# Patient Record
Sex: Male | Born: 1955 | Race: White | Hispanic: No | Marital: Married | Smoking: Former smoker
Health system: Southern US, Community
[De-identification: ages and names within clinical notes are randomized; demographics above are authoritative.]

## PROBLEM LIST (undated history)

## (undated) DIAGNOSIS — N400 Enlarged prostate without lower urinary tract symptoms: Secondary | ICD-10-CM

## (undated) DIAGNOSIS — E785 Hyperlipidemia, unspecified: Secondary | ICD-10-CM

## (undated) DIAGNOSIS — R7302 Impaired glucose tolerance (oral): Secondary | ICD-10-CM

## (undated) DIAGNOSIS — I10 Essential (primary) hypertension: Secondary | ICD-10-CM

## (undated) HISTORY — PX: TOTAL KNEE ARTHROPLASTY: SHX125

---

## 2020-02-13 DIAGNOSIS — G459 Transient cerebral ischemic attack, unspecified: Secondary | ICD-10-CM

## 2020-02-13 HISTORY — DX: Transient cerebral ischemic attack, unspecified: G45.9

## 2020-10-17 ENCOUNTER — Other Ambulatory Visit (HOSPITAL_COMMUNITY): Payer: Medicare Other

## 2020-10-17 ENCOUNTER — Inpatient Hospital Stay (HOSPITAL_COMMUNITY): Payer: Medicare Other

## 2020-10-17 ENCOUNTER — Emergency Department (HOSPITAL_COMMUNITY): Payer: Medicare Other

## 2020-10-17 ENCOUNTER — Encounter (HOSPITAL_COMMUNITY): Payer: Self-pay | Admitting: Internal Medicine

## 2020-10-17 ENCOUNTER — Inpatient Hospital Stay (HOSPITAL_COMMUNITY)
Admission: EM | Admit: 2020-10-17 | Discharge: 2020-10-21 | DRG: 066 | Disposition: A | Discharge status: Rehabilitation Facility | Payer: Medicare Other | Attending: Internal Medicine | Admitting: Internal Medicine

## 2020-10-17 DIAGNOSIS — I63212 Cerebral infarction due to unspecified occlusion or stenosis of left vertebral arteries: Secondary | ICD-10-CM | POA: Diagnosis present

## 2020-10-17 DIAGNOSIS — H5500 Unspecified nystagmus: Secondary | ICD-10-CM | POA: Diagnosis present

## 2020-10-17 DIAGNOSIS — I10 Essential (primary) hypertension: Secondary | ICD-10-CM | POA: Diagnosis present

## 2020-10-17 DIAGNOSIS — R490 Dysphonia: Secondary | ICD-10-CM | POA: Diagnosis present

## 2020-10-17 DIAGNOSIS — I6521 Occlusion and stenosis of right carotid artery: Secondary | ICD-10-CM | POA: Diagnosis present

## 2020-10-17 DIAGNOSIS — Z823 Family history of stroke: Secondary | ICD-10-CM

## 2020-10-17 DIAGNOSIS — R1310 Dysphagia, unspecified: Secondary | ICD-10-CM | POA: Diagnosis present

## 2020-10-17 DIAGNOSIS — I672 Cerebral atherosclerosis: Secondary | ICD-10-CM | POA: Diagnosis present

## 2020-10-17 DIAGNOSIS — Z7984 Long term (current) use of oral hypoglycemic drugs: Secondary | ICD-10-CM

## 2020-10-17 DIAGNOSIS — G464 Cerebellar stroke syndrome: Secondary | ICD-10-CM | POA: Diagnosis not present

## 2020-10-17 DIAGNOSIS — Z8249 Family history of ischemic heart disease and other diseases of the circulatory system: Secondary | ICD-10-CM

## 2020-10-17 DIAGNOSIS — E785 Hyperlipidemia, unspecified: Secondary | ICD-10-CM | POA: Diagnosis present

## 2020-10-17 DIAGNOSIS — R7302 Impaired glucose tolerance (oral): Secondary | ICD-10-CM | POA: Diagnosis present

## 2020-10-17 DIAGNOSIS — R42 Dizziness and giddiness: Secondary | ICD-10-CM

## 2020-10-17 DIAGNOSIS — E669 Obesity, unspecified: Secondary | ICD-10-CM | POA: Diagnosis present

## 2020-10-17 DIAGNOSIS — Z6834 Body mass index (BMI) 34.0-34.9, adult: Secondary | ICD-10-CM

## 2020-10-17 DIAGNOSIS — R200 Anesthesia of skin: Secondary | ICD-10-CM | POA: Diagnosis present

## 2020-10-17 DIAGNOSIS — R29700 NIHSS score 0: Secondary | ICD-10-CM | POA: Diagnosis present

## 2020-10-17 DIAGNOSIS — I6502 Occlusion and stenosis of left vertebral artery: Secondary | ICD-10-CM

## 2020-10-17 DIAGNOSIS — R270 Ataxia, unspecified: Secondary | ICD-10-CM | POA: Diagnosis present

## 2020-10-17 DIAGNOSIS — G459 Transient cerebral ischemic attack, unspecified: Secondary | ICD-10-CM

## 2020-10-17 DIAGNOSIS — Z7982 Long term (current) use of aspirin: Secondary | ICD-10-CM

## 2020-10-17 DIAGNOSIS — R29818 Other symptoms and signs involving the nervous system: Secondary | ICD-10-CM

## 2020-10-17 DIAGNOSIS — N400 Enlarged prostate without lower urinary tract symptoms: Secondary | ICD-10-CM | POA: Diagnosis present

## 2020-10-17 DIAGNOSIS — I69398 Other sequelae of cerebral infarction: Secondary | ICD-10-CM | POA: Diagnosis not present

## 2020-10-17 DIAGNOSIS — I639 Cerebral infarction, unspecified: Secondary | ICD-10-CM | POA: Diagnosis present

## 2020-10-17 DIAGNOSIS — Z8673 Personal history of transient ischemic attack (TIA), and cerebral infarction without residual deficits: Secondary | ICD-10-CM

## 2020-10-17 DIAGNOSIS — R4789 Other speech disturbances: Secondary | ICD-10-CM | POA: Diagnosis present

## 2020-10-17 DIAGNOSIS — I679 Cerebrovascular disease, unspecified: Secondary | ICD-10-CM

## 2020-10-17 DIAGNOSIS — I708 Atherosclerosis of other arteries: Secondary | ICD-10-CM | POA: Diagnosis present

## 2020-10-17 DIAGNOSIS — Z79899 Other long term (current) drug therapy: Secondary | ICD-10-CM

## 2020-10-17 DIAGNOSIS — H532 Diplopia: Secondary | ICD-10-CM | POA: Diagnosis present

## 2020-10-17 DIAGNOSIS — G463 Brain stem stroke syndrome: Secondary | ICD-10-CM | POA: Diagnosis not present

## 2020-10-17 DIAGNOSIS — I6389 Other cerebral infarction: Secondary | ICD-10-CM | POA: Diagnosis not present

## 2020-10-17 DIAGNOSIS — Z20822 Contact with and (suspected) exposure to covid-19: Secondary | ICD-10-CM | POA: Diagnosis present

## 2020-10-17 HISTORY — DX: Benign prostatic hyperplasia without lower urinary tract symptoms: N40.0

## 2020-10-17 HISTORY — DX: Essential (primary) hypertension: I10

## 2020-10-17 HISTORY — DX: Hyperlipidemia, unspecified: E78.5

## 2020-10-17 HISTORY — DX: Impaired glucose tolerance (oral): R73.02

## 2020-10-17 LAB — DIFFERENTIAL
Abs Immature Granulocytes: 0.02 10*3/uL (ref 0.00–0.07)
Basophils Absolute: 0 10*3/uL (ref 0.0–0.1)
Basophils Relative: 1 %
Eosinophils Absolute: 0.1 10*3/uL (ref 0.0–0.5)
Eosinophils Relative: 1 %
Immature Granulocytes: 0 %
Lymphocytes Relative: 26 %
Lymphs Abs: 1.8 10*3/uL (ref 0.7–4.0)
Monocytes Absolute: 0.6 10*3/uL (ref 0.1–1.0)
Monocytes Relative: 9 %
Neutro Abs: 4.4 10*3/uL (ref 1.7–7.7)
Neutrophils Relative %: 63 %

## 2020-10-17 LAB — I-STAT CHEM 8, ED
BUN: 24 mg/dL — ABNORMAL HIGH (ref 8–23)
Calcium, Ion: 1.08 mmol/L — ABNORMAL LOW (ref 1.15–1.40)
Chloride: 108 mmol/L (ref 98–111)
Creatinine, Ser: 1.1 mg/dL (ref 0.61–1.24)
Glucose, Bld: 107 mg/dL — ABNORMAL HIGH (ref 70–99)
HCT: 41 % (ref 39.0–52.0)
Hemoglobin: 13.9 g/dL (ref 13.0–17.0)
Potassium: 4 mmol/L (ref 3.5–5.1)
Sodium: 142 mmol/L (ref 135–145)
TCO2: 25 mmol/L (ref 22–32)

## 2020-10-17 LAB — LIPID PANEL
Cholesterol: 156 mg/dL (ref 0–200)
HDL: 38 mg/dL — ABNORMAL LOW (ref >40)
LDL Cholesterol: 80 mg/dL (ref 0–99)
Total CHOL/HDL Ratio: 4.1 RATIO
Triglycerides: 189 mg/dL — ABNORMAL HIGH (ref <150)
VLDL: 38 mg/dL (ref 0–40)

## 2020-10-17 LAB — CBC
HCT: 43.9 % (ref 39.0–52.0)
Hemoglobin: 13.9 g/dL (ref 13.0–17.0)
MCH: 28.1 pg (ref 26.0–34.0)
MCHC: 31.7 g/dL (ref 30.0–36.0)
MCV: 88.9 fL (ref 80.0–100.0)
Platelets: 151 10*3/uL (ref 150–400)
RBC: 4.94 MIL/uL (ref 4.22–5.81)
RDW: 14.1 % (ref 11.5–15.5)
WBC: 7 10*3/uL (ref 4.0–10.5)
nRBC: 0 % (ref 0.0–0.2)

## 2020-10-17 LAB — CBG MONITORING, ED: Glucose-Capillary: 112 mg/dL — ABNORMAL HIGH (ref 70–99)

## 2020-10-17 LAB — COMPREHENSIVE METABOLIC PANEL
ALT: 28 U/L (ref 0–44)
AST: 27 U/L (ref 15–41)
Albumin: 4.2 g/dL (ref 3.5–5.0)
Alkaline Phosphatase: 75 U/L (ref 38–126)
Anion gap: 9 (ref 5–15)
BUN: 24 mg/dL — ABNORMAL HIGH (ref 8–23)
CO2: 26 mmol/L (ref 22–32)
Calcium: 9.4 mg/dL (ref 8.9–10.3)
Chloride: 106 mmol/L (ref 98–111)
Creatinine, Ser: 1.17 mg/dL (ref 0.61–1.24)
GFR, Estimated: >60 mL/min (ref >60)
Glucose, Bld: 105 mg/dL — ABNORMAL HIGH (ref 70–99)
Potassium: 4.1 mmol/L (ref 3.5–5.1)
Sodium: 141 mmol/L (ref 135–145)
Total Bilirubin: 0.4 mg/dL (ref 0.3–1.2)
Total Protein: 7 g/dL (ref 6.5–8.1)

## 2020-10-17 LAB — HEPATITIS PANEL, ACUTE
HCV Ab: NONREACTIVE
Hep A IgM: NONREACTIVE
Hep B C IgM: NONREACTIVE
Hepatitis B Surface Ag: NONREACTIVE

## 2020-10-17 LAB — HEMOGLOBIN A1C
Hgb A1c MFr Bld: 5.8 % — ABNORMAL HIGH (ref 4.8–5.6)
Mean Plasma Glucose: 119.76 mg/dL

## 2020-10-17 LAB — GLUCOSE, CAPILLARY: Glucose-Capillary: 148 mg/dL — ABNORMAL HIGH (ref 70–99)

## 2020-10-17 LAB — PROTIME-INR
INR: 1 (ref 0.8–1.2)
Prothrombin Time: 13.5 seconds (ref 11.4–15.2)

## 2020-10-17 LAB — SEDIMENTATION RATE: Sed Rate: 5 mm/hr (ref 0–16)

## 2020-10-17 LAB — APTT: aPTT: 28 seconds (ref 24–36)

## 2020-10-17 LAB — HIV ANTIBODY (ROUTINE TESTING W REFLEX): HIV Screen 4th Generation wRfx: NONREACTIVE

## 2020-10-17 LAB — SARS CORONAVIRUS 2 (TAT 6-24 HRS): SARS Coronavirus 2: NEGATIVE

## 2020-10-17 LAB — C-REACTIVE PROTEIN: CRP: 0.6 mg/dL (ref <1.0)

## 2020-10-17 MED ORDER — HYDRALAZINE HCL 20 MG/ML IJ SOLN
5.0000 mg | INTRAMUSCULAR | Status: DC | PRN
  Administered 2020-10-17: 5 mg via INTRAVENOUS
  Filled 2020-10-17: qty 1

## 2020-10-17 MED ORDER — ROSUVASTATIN CALCIUM 20 MG PO TABS: 20.0000 mg | ORAL_TABLET | Freq: Every day | ORAL | Status: DC

## 2020-10-17 MED ORDER — LIDOCAINE HCL (CARDIAC) PF 100 MG/5ML IV SOSY
PREFILLED_SYRINGE | INTRAVENOUS | Status: AC
  Filled 2020-10-17: qty 5

## 2020-10-17 MED ORDER — ROSUVASTATIN CALCIUM 5 MG PO TABS: 10.0000 mg | ORAL_TABLET | Freq: Every day | ORAL | Status: DC

## 2020-10-17 MED ORDER — ACETAMINOPHEN 325 MG PO TABS: 650.0000 mg | ORAL_TABLET | ORAL | Status: DC | PRN

## 2020-10-17 MED ORDER — SENNOSIDES-DOCUSATE SODIUM 8.6-50 MG PO TABS: 1.0000 | ORAL_TABLET | Freq: Every evening | ORAL | Status: DC | PRN

## 2020-10-17 MED ORDER — ACETAMINOPHEN 160 MG/5ML PO SOLN: 650.0000 mg | ORAL | Status: DC | PRN

## 2020-10-17 MED ORDER — ASPIRIN 300 MG RE SUPP: 300.0000 mg | Freq: Every day | RECTAL | Status: DC

## 2020-10-17 MED ORDER — IOHEXOL 350 MG/ML SOLN
75.0000 mL | Freq: Once | INTRAVENOUS | Status: AC | PRN
  Administered 2020-10-17: 75 mL via INTRAVENOUS

## 2020-10-17 MED ORDER — SODIUM CHLORIDE 0.9% FLUSH: 3.0000 mL | Freq: Once | INTRAVENOUS | Status: DC

## 2020-10-17 MED ORDER — ASPIRIN 325 MG PO TABS
325.0000 mg | ORAL_TABLET | Freq: Every day | ORAL | Status: DC
  Administered 2020-10-17 – 2020-10-21 (×5): 325 mg via ORAL
  Filled 2020-10-17 (×4): qty 1

## 2020-10-17 MED ORDER — CLOPIDOGREL BISULFATE 75 MG PO TABS
75.0000 mg | ORAL_TABLET | Freq: Every day | ORAL | Status: DC
  Administered 2020-10-17 – 2020-10-21 (×5): 75 mg via ORAL
  Filled 2020-10-17 (×5): qty 1

## 2020-10-17 MED ORDER — ACETAMINOPHEN 650 MG RE SUPP: 650.0000 mg | RECTAL | Status: DC | PRN

## 2020-10-17 MED ORDER — HYDRALAZINE HCL 20 MG/ML IJ SOLN
5.0000 mg | Freq: Once | INTRAMUSCULAR | Status: AC
  Administered 2020-10-17: 5 mg via INTRAVENOUS
  Filled 2020-10-17: qty 1

## 2020-10-17 MED ORDER — GABAPENTIN 300 MG PO CAPS
300.0000 mg | ORAL_CAPSULE | Freq: Every day | ORAL | Status: DC
  Administered 2020-10-18 – 2020-10-21 (×4): 300 mg via ORAL
  Filled 2020-10-17 (×4): qty 1

## 2020-10-17 MED ORDER — ACETAMINOPHEN 500 MG PO TABS
500.0000 mg | ORAL_TABLET | Freq: Two times a day (BID) | ORAL | Status: DC
  Administered 2020-10-17 – 2020-10-21 (×8): 500 mg via ORAL
  Filled 2020-10-17 (×8): qty 1

## 2020-10-17 MED ORDER — ENOXAPARIN SODIUM 40 MG/0.4ML IJ SOSY
40.0000 mg | PREFILLED_SYRINGE | INTRAMUSCULAR | Status: DC
  Administered 2020-10-17 – 2020-10-20 (×3): 40 mg via SUBCUTANEOUS
  Filled 2020-10-17 (×3): qty 0.4

## 2020-10-17 MED ORDER — IOHEXOL 350 MG/ML SOLN: 75.0000 mL | Freq: Once | INTRAVENOUS | Status: DC | PRN

## 2020-10-17 MED ORDER — LIDOCAINE HCL (PF) 1 % IJ SOLN
INTRAMUSCULAR | Status: AC
  Filled 2020-10-17: qty 30

## 2020-10-17 MED ORDER — ONDANSETRON HCL 4 MG/2ML IJ SOLN
INTRAMUSCULAR | Status: AC
  Administered 2020-10-17: 4 mg
  Filled 2020-10-17: qty 2

## 2020-10-17 MED ORDER — STROKE: EARLY STAGES OF RECOVERY BOOK: Freq: Once | Status: DC

## 2020-10-17 MED ORDER — ROSUVASTATIN CALCIUM 20 MG PO TABS
40.0000 mg | ORAL_TABLET | Freq: Every day | ORAL | Status: DC
  Administered 2020-10-17 – 2020-10-20 (×4): 40 mg via ORAL
  Filled 2020-10-17 (×4): qty 2

## 2020-10-17 MED ORDER — INSULIN ASPART 100 UNIT/ML IJ SOLN
0.0000 [IU] | Freq: Three times a day (TID) | INTRAMUSCULAR | Status: DC
  Administered 2020-10-18 – 2020-10-21 (×2): 1 [IU] via SUBCUTANEOUS

## 2020-10-17 MED ORDER — SODIUM CHLORIDE 0.9 % IV SOLN: INTRAVENOUS | Status: DC

## 2020-10-17 MED ORDER — FINASTERIDE 5 MG PO TABS
5.0000 mg | ORAL_TABLET | Freq: Every day | ORAL | Status: DC
  Administered 2020-10-18 – 2020-10-21 (×4): 5 mg via ORAL
  Filled 2020-10-17 (×4): qty 1

## 2020-10-17 NOTE — Progress Notes (Signed)
Patient arrived to 5C17 via stretcher, wife at bedside. Safety precautions and orders reviewed with patient. TELE applied and confirmed. VS appears stabled. Pt denied any pain but still with dz with movement. No other distress noted. Will continue to monitor.

## 2020-10-17 NOTE — H&P (Signed)
History and Physical    [employer] Sr[employer] ZOX:[id number] DOB: 1956/04/09 DOA: 10/17/2020  PCP: Dennis Palma., MD - [facility] Consultants:  Thamas Jaegers - orthopedics Patient coming from:  Home - lives with wife, daughter, grandson; NOK: Wife, [phone]   Chief Complaint: Neurologic changes  HPI: Dennis Ambrosini Sr. is a 65 y.o. male with medical history significant of HTN; IGT; elevated PSA; TKR 4 months ago; and TIA (02/2020) presenting with neurologic changes.He notices L face and foot tingling and voice changes.   Over the last 1-2 weeks, he has had severe vertigo and was prescribed Meclizine.  Last night, he noticed severe headache that woke him from sleep in his left temple.  Symptoms were similar to prior TIA.  He had an attempt at LP in the ER and this was not successful.  The facial symptoms are always there but currently intensified.  All symptoms are on the left - leg/foot tingling, facial tingling, vertigo, L visual blurriness.  No dysarthria.  His phonal quality is different from usual.  He feels dry/parched right now.    ED Course:  Carryover, per Dr. Margo Aye:  Dennis Strickland is a 65 y.o. male with past medical history of prior TIA/CVA presents to the ED with new gait instability and HA. LSN at 11 PM. Patient awoke at 1AM with left sided HA and trouble walking. MRI brain negative for acute CVA. Seen by stroke team, Dr. Wilford Corner. Concern for vasculitis, has ongoing work up. LP attempted in the ED but unsuccessful. May need LP under fluoroscopy by IR.   Review of Systems: As per HPI; otherwise review of systems reviewed and negative.   Ambulatory Status:  Ambulates without assistance  COVID Vaccine Status:  None  Past Medical History:  Diagnosis Date  . BPH (benign prostatic hyperplasia)   . Dyslipidemia   . Hypertension   . Impaired glucose tolerance   . TIA (transient ischemic attack) 02/2020    Past Surgical History:  Procedure Laterality Date  . TOTAL KNEE  ARTHROPLASTY      Social History   Socioeconomic History  . Marital status: Married    Spouse name: Not on file  . Number of children: Not on file  . Years of education: Not on file  . Highest education level: Not on file  Occupational History  . Occupation: Curator  Tobacco Use  . Smoking status: Never Smoker  . Smokeless tobacco: Never Used  Substance and Sexual Activity  . Alcohol use: Never  . Drug use: Never  . Sexual activity: Not on file  Other Topics Concern  . Not on file  Social History Narrative  . Not on file   Social Determinants of Health   Financial Resource Strain: Not on file  Food Insecurity: Not on file  Transportation Needs: Not on file  Physical Activity: Not on file  Stress: Not on file  Social Connections: Not on file  Intimate Partner Violence: Not on file    No Known Allergies  Family History  Problem Relation Age of Onset  . Hypertension Mother   . CVA Sister   . Lupus Sister   . CVA Brother 49    Prior to Admission medications   Not on File    Physical Exam: Vitals:   10/17/20 1030 10/17/20 1145 10/17/20 1300 10/17/20 1620  BP: (!) 182/78 (!) 157/95 (!) 192/96 (!) 190/90  Pulse: (!) 56 62 78 79  Resp: 15  16 18   Temp:   97.9 F (  36.6 C) 97.8 F (36.6 C)  TempSrc:   Oral Oral  SpO2: 94% 94% 94% 95%     . General:  Appears calm and comfortable and is in NAD . Eyes:  PERRL, EOMI, normal lids, iris . ENT:  grossly normal hearing, lips & tongue, mildly dry mm . Neck:  no LAD, masses or thyromegaly; no carotid bruits . Cardiovascular:  RRR, no m/r/g. No LE edema.  Marland Kitchen. Respiratory:   CTA bilaterally with no wheezes/rales/rhonchi.  Normal respiratory effort. . Abdomen:  soft, NT, ND . Skin:  no rash or induration seen on limited exam . Musculoskeletal:  grossly normal tone BUE/BLE, good ROM, no bony abnormality . Psychiatric:  grossly normal mood and affect, speech fluent and appropriate with mild hypophonia,  AOx3 . Neurologic:  CN 2-12 grossly intact, moves all extremities in coordinated fashion, sensation intact    Radiological Exams on Admission: Independently reviewed - see discussion in A/P where applicable  MR BRAIN WO CONTRAST  Result Date: 10/17/2020 CLINICAL DATA:  Initial evaluation for acute stroke. EXAM: MRI HEAD WITHOUT CONTRAST TECHNIQUE: Multiplanar, multiecho pulse sequences of the brain and surrounding structures were obtained without intravenous contrast. COMPARISON:  Prior studies from earlier the same day as well as previous MRI from 03/05/2020. FINDINGS: Brain: Examination degraded by motion artifact. Generalized age-related cerebral atrophy. Patchy T2/FLAIR hyperintensity within the periventricular and deep white matter both cerebral hemispheres most consistent with chronic small vessel ischemic disease, moderate in nature. Superimposed remote lacunar infarct present at the right thalamus. No abnormal foci of restricted diffusion to suggest acute or subacute ischemia. Gray-white matter differentiation maintained. No encephalomalacia to suggest chronic cortical infarction. No evidence for acute or chronic intracranial hemorrhage. No mass lesion, midline shift or mass effect. No hydrocephalus or extra-axial fluid collection. Pituitary gland and suprasellar region within normal limits. Midline structures intact. Vascular: Absent flow void within the left V4 segment, consistent with previously identified occlusion. Major intracranial vascular flow voids are otherwise maintained. Skull and upper cervical spine: Craniocervical junction within normal limits. Upper cervical spine normal. Bone marrow signal intensity within normal limits. No focal marrow replacing lesion. No scalp soft tissue abnormality. Sinuses/Orbits: Globes orbital soft tissues demonstrate no acute finding. Mild scattered mucosal thickening noted within the ethmoidal air cells and maxillary sinuses. Paranasal sinuses are  otherwise clear. No mastoid effusion. Inner ear structures grossly normal. Other: None. IMPRESSION: 1. No acute intracranial abnormality. 2. 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 Overall, appearance is similar as compared to previous MRI from 03/05/2020. Electronically Signed   By: Rise MuBenjamin  McClintock M.D.   On: 10/17/2020 04:23   CT HEAD CODE STROKE WO CONTRAST  Result Date: 10/17/2020 CLINICAL DATA:  Code stroke. Initial evaluation for neuro deficit, stroke suspected. EXAM: CT HEAD WITHOUT CONTRAST TECHNIQUE: Contiguous axial images were obtained from the base of the skull through the vertex without intravenous contrast. COMPARISON:  None. FINDINGS: Brain: Age-related cerebral atrophy with moderate chronic microvascular ischemic disease. Small remote lacunar infarct at the right thalamus. No acute intracranial hemorrhage. No acute large vessel territory infarct. No mass lesion, midline shift or mass effect. No hydrocephalus or extra-axial fluid collection. Vascular: No hyperdense vessel. Skull: Scalp soft tissues within normal limits.  Calvarium intact. Sinuses/Orbits: Globes and orbital soft tissues demonstrate no acute finding. Paranasal sinuses are largely clear. No mastoid effusion. Other: None. ASPECTS [employer]([place] Stroke Program Early CT Score) - Ganglionic level infarction (caudate, lentiform  nuclei, internal capsule, insula, M1-M3 cortex): 7 - Supraganglionic infarction (M4-M6 cortex): 3 Total score (0-10 with 10 being normal): 10 IMPRESSION: 1. No acute intracranial infarct or other abnormality. 2. ASPECTS is 10. 3. Age-related cerebral atrophy with chronic small vessel ischemic disease. These results were communicated to Dr. Wilford Corner at 2:35 amon 5/6/2022by text page via the [facility] messaging system. Electronically Signed   By: Rise Mu M.D.   On: 10/17/2020 02:36   CT ANGIO HEAD NECK W WO  CM W PERF (CODE STROKE)  Result Date: 10/17/2020 CLINICAL DATA:  Follow-up examination for acute stroke. EXAM: CT ANGIOGRAPHY HEAD AND NECK CT PERFUSION BRAIN TECHNIQUE: Multidetector CT imaging of the head and neck was performed using the standard protocol during bolus administration of intravenous contrast. Multiplanar CT image reconstructions and MIPs were obtained to evaluate the vascular anatomy. Carotid stenosis measurements (when applicable) are obtained utilizing NASCET criteria, using the distal internal carotid diameter as the denominator. Multiphase CT imaging of the brain was performed following IV bolus contrast injection. Subsequent parametric perfusion maps were calculated using RAPID software. CONTRAST:  38mL OMNIPAQUE IOHEXOL 350 MG/ML SOLN COMPARISON:  Prior head CT from earlier the same day. FINDINGS: CTA NECK FINDINGS Aortic arch: Examination technically limited by motion and timing of the contrast bolus. Visualized aortic arch normal caliber with normal branch pattern. No stenosis about the origin of the great vessels. Right carotid system: Right CCA patent from its origin to the bifurcation. Mild mixed plaque about the right bifurcation without significant stenosis. Right ICA patent distally without stenosis, dissection or occlusion. Left carotid system: Left CCA patent from its origin to the bifurcation without stenosis. Mild mixed plaque about the left bifurcation without significant stenosis. Left ICA widely patent distally without stenosis, dissection or occlusion. Vertebral arteries: Both vertebral arteries arise from subclavian arteries. Right vertebral artery dominant. Origin and proximal left vertebral artery not well evaluated due to adjacent venous contamination. Visualized portions of the vertebral arteries patent within the neck without appreciable stenosis or other acute vascular abnormality. Skeleton: No acute osseous abnormality. No discrete or worrisome osseous lesions. Other  neck: No other visible acute soft tissue abnormality within the neck. No mass or adenopathy. Upper chest: Visualized upper chest demonstrates no acute finding. Review of the MIP images confirms the above findings CTA HEAD FINDINGS Anterior circulation: Focal severe stenosis involving the horizontal petrous right ICA (series 7, image 127). Petrous left ICA widely patent. Additional scattered plaque within the carotid siphons with no more than mild stenosis elsewhere. A1 segments patent bilaterally. Normal anterior communicating artery complex. Anterior cerebral arteries patent to their distal aspects without appreciable stenosis. No M1 stenosis or occlusion. Normal MCA bifurcations. Distal MCA branches well perfused. Posterior circulation: Dominant right V4 segment patent to the vertebrobasilar junction without stenosis. Right PICA patent. Left vertebral artery largely occludes just as it courses into the cranial vault. Left PICA is perfused at the skull base. Basilar patent to its distal aspect without stenosis. Superior cerebellar arteries patent bilaterally. Both PCAs primarily supplied via the basilar. Multifocal atheromatous irregularity seen throughout the PCAs bilaterally. Associated multifocal severe left P2 stenoses (series 10, image 21). PCAs otherwise perfused to their distal aspects without proximal stenosis. Venous sinuses: Grossly patent allowing for timing the contrast bolus. Anatomic variants: None significant. No visible aneurysm. Review of the MIP images confirms the above findings CT Brain Perfusion Findings: ASPECTS: 10. CBF (<30%) Volume: 21mL Perfusion (Tmax>6.0s) volume: 13mL Mismatch Volume: 31mL Infarction Location:Negative CT perfusion for acute ischemia.  Delayed perfusion seen within the left cerebellar hemisphere related to the occluded left vertebral artery. IMPRESSION: CTA HEAD AND NECK IMPRESSION: 1. Negative CTA for emergent large vessel occlusion. 2. Focal severe stenosis involving the  horizontal petrous right ICA. 3. Multifocal severe left P2 stenoses. 4. 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 Dominant right vertebral artery widely patent. 5. Mild atheromatous disease about the major arterial vasculature within the neck. No flow-limiting stenosis within the neck. CT PERFUSION IMPRESSION: 1. Negative CT perfusion for acute ischemia. 2. 7 cc perfusion deficit within the left cerebellar hemisphere, in keeping with the occluded left V4 segment. Electronically Signed   By: Rise Mu M.D.   On: 10/17/2020 03:57    EKG: Independently reviewed.  NSR with rate 58; LVH with no evidence of acute ischemia   Labs on Admission: I have personally reviewed the available labs and imaging studies at the time of the admission.  Pertinent labs:   Glucose 105 BUN 24/Creatinine 1.17/GFR >60 Normal CBC INR 1.0   Assessment/Plan Principal Problem:   Acute CVA (cerebrovascular accident) (HCC) Active Problems:   Hypertension   Dyslipidemia   BPH (benign prostatic hyperplasia)   Impaired glucose tolerance   CVA -Patient presenting with hypophonia, severe vertigo, and L face and body tingling -Concerning for TIA/CVA -MRI was negative for infarction -However, CTA showed focal severe stenosis of the horizontal petrous R ICA as well as multifocal severe L P2 stenoses and V4 segment occlusion; there was also a 7cc perfusion deficit in the L cerebellar hemisphere -While vasculitis was considered (and so LP was attempted and ordered via IR), this was subsequently deemed less likely due to likely presence of acute CVA -He is thought to have a 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; MRI was likely too early to detect -Aspirin has been given to reduce stroke mortality and decrease morbidity -Will admit for further evaluation and monitoring -Telemetry monitoring -Repeat MRI tomorrow, likely plan for cerebral angiogram on  Monday -Echo -Risk stratification with FLP, A1c; will also check UDS -Neurology consult -PT/OT/ST/Nutrition Consults  HTN -Allow permissive HTN for now -Treat BP only if >180 per neurology, and then with goal of 15% reduction -Hold lisinopril and plan to restart in 48-72 hours   HLD -Check FLP - 156/38/80/189 -Continue Crestor but increase to 20 mg daily for goal LDL <70   IGT -A1c is 5.8, indicating good control -Hold home PO Metformin -Will order moderate-scale SSI -Continue Neurontin  BPH -Continue Proscar -Hold tamsulosin for now     Note: This patient has been tested and is negative for the novel coronavirus COVID-19. She has been fully vaccinated against COVID-19.    DVT prophylaxis:  Lovenox  Code Status: Full - confirmed with patient/family Family Communication: Wife present throughout evaluation Disposition Plan:  The patient is from: home  Anticipated d/c is to: home without [facility] services  Anticipated d/c date will depend on clinical response to treatment, likely early next week after cerebral angiogram  Patient is currently: acutely ill Consults called: Neurology; PT/OT/ST/Nutrition; TOC team Admission status: Admit - It is my clinical opinion that admission to INPATIENT is reasonable and necessary because of the expectation that this patient will require hospital care that crosses at least 2 midnights to treat this condition based on the medical complexity of the problems presented.  Given the aforementioned information, the predictability of an adverse outcome is felt to be significant.    Jonah Blue  MD [facility]   How to contact the [facility] Attending or Consulting provider 7A - 7P or covering provider during after hours 7P -7A, for this patient?  1. Check the care team in [facility] and look for a) attending/consulting [facility] provider listed and b) the [facility] team listed 2. Log into [email or web address] and use [place]'s universal password to access. If you do  not have the password, please contact the hospital operator. 3. Locate the [facility] provider you are looking for under [facility] and page to a number that you can be directly reached. 4. If you still have difficulty reaching the provider, please page the [facility] (Director on Call) for the Hospitalists listed on amion for assistance.   10/17/2020, 5:37 PM

## 2020-10-17 NOTE — ED Notes (Signed)
Patient vomited a  Large amt. States he was so dizzy after vomiting patient left much better , Dr. Ophelia Charter aware and orders received.

## 2020-10-17 NOTE — ED Notes (Signed)
Patient states he still feels numb on the left side of his face however able to feel the same , good strength to all ext. Alert oriented.

## 2020-10-17 NOTE — Code Documentation (Addendum)
Stroke Response Nurse Documentation Code Documentation  Dennis Guymon Sr. is a 65 y.o. male arriving to [facility] ED via [facility] on 5/6 with past medical hx of TIA, HTN,HLD. Code stroke was activated by EMS. Patient from home where he was LKW at 2300 and now complaining of Left sided numbness and altered gait. On aspirin 325 mg daily. Stroke team at the bedside on patient arrival. Labs drawn and patient cleared for CT by Dr. Jacqulyn Bath. Patient to CT with team. NIHSS 0 see documentation for details and code stroke times. Patient with no fixed deficits on exam. The following imaging was completed:  CTP/MRI. Patient is not a candidate for tPA due to improving symptoms. Bedside handoff with ED RN Thayer Ohm.    Rose Fillers  Rapid Response RN

## 2020-10-17 NOTE — Consult Note (Signed)
Neurology Consultation  Reason for Consult: Code stroke-left-sided numbness, incoordination, headache Referring Physician: Dr. Alona Bene  CC: Left-sided numbness, incoordination, headache-code stroke  History is obtained from: Patient, chart  HPI: Dennis Strickland is a 65 y.o. male past medical history of hypertension, hyperlipidemia, prediabetes, presumed TIA in September 2021-where the presentation was left-sided tingling numbness and left eye drooping and inability to walk, presenting today with potentially similar symptoms. Last known well at 11 PM when he went to bed and woke up at 2 AM with a severe headache, when he tried to walk, was difficult and he was discoordinated.  He felt that the left side of his face was numb.  To touch, he did not feel any difference in sensation but it was a subjective sensation of numbness.  He also felt that his voice sounded muffled. EMS was called and given sudden onset of focal neurological deficits, code stroke was called. His systolic blood pressures at home were in the 180s to 190s via EMS. He was evaluated in the emergency department as an acute code stroke. Reports no history of headaches or migraines.  He reports reasonable compliance to antihypertensive-very infrequently misses doses.  Care everywhere records with stroke work-up from September 2021 Unremarkable echocardiogram with no evidence of shunt on transthoracic echo with bubble. MRI negative for stroke CTA head and neck with no emergent LVO LDL 76- atorvastatin 80 Discharged home on aspirin and Plavix for 21 days followed by aspirin only Lisinopril increased to 20 mg twice daily No arrhythmias noted Most recent A1c-5.8-06/18/2020   LKW: 11 PM on 10/16/2020 tpa given?: no, too mild to treat versus stroke mimic  (hypertensive emergency versus complex migraine) Premorbid modified Rankin scale (mRS): 0  ROS: Full ROS was performed and is negative except as noted in the HPI.   No past  medical history on file.  Past medical history Hypertension-hyperlipidemia Prediabetes Presumed TIA in September 2021 with features of hypertensive emergency   No family history on file.  Social History:   has no history on file for tobacco use, alcohol use, and drug use.  Medications  Current Facility-Administered Medications:  .  sodium chloride flush (NS) 0.9 % injection 3 mL, 3 mL, Intravenous, Once, Long, Arlyss Repress, MD No current outpatient medications on file.   Exam: Current vital signs: BP (!) 181/97 (BP Location: Right Arm)   Pulse (!) 58   Resp 19   SpO2 98%  Vital signs in last 24 hours: Pulse Rate:  [58] 58 (05/06 0220) Resp:  [19] 19 (05/06 0220) BP: (181)/(97) 181/97 (05/06 0220) SpO2:  [98 %] 98 % (05/06 0220) GENERAL: Awake, alert in NAD HEENT: - Normocephalic and atraumatic, dry mm, no LN++, no Thyromegally LUNGS - Clear to auscultation bilaterally with no wheezes CV - S1S2 RRR, no m/r/g, equal pulses bilaterally. ABDOMEN - Soft, nontender, nondistended with normoactive BS Ext: warm, well perfused, intact peripheral pulses, no edema  NEURO:  Mental Status: AA&Ox3  Language: speech is clear but mildly hypophonic.  Naming, repetition, fluency, and comprehension intact. Cranial Nerves: PERRL EOMI, visual fields full, no facial asymmetry, facial sensation intact to light touch but subjectively feels numb on the left side of the face, hearing intact, tongue/uvula/soft palate midline, normal sternocleidomastoid and trapezius muscle strength. No evidence of tongue atrophy or fibrillations Motor: 5/5 in all fours without drift Tone: is normal and bulk is normal Sensation- Intact to light touch bilaterally Coordination: FTN intact bilaterally, no ataxia in BLE. Gait- deferred  NIHSS-0  Labs I have reviewed labs in epic and the results pertinent to this consultation are:   CBC    Component Value Date/Time   WBC 7.0 10/17/2020 0218   RBC 4.94  10/17/2020 0218   HGB 13.9 10/17/2020 0227   HCT 41.0 10/17/2020 0227   PLT 151 10/17/2020 0218   MCV 88.9 10/17/2020 0218   MCH 28.1 10/17/2020 0218   MCHC 31.7 10/17/2020 0218   RDW 14.1 10/17/2020 0218   LYMPHSABS 1.8 10/17/2020 0218   MONOABS 0.6 10/17/2020 0218   EOSABS 0.1 10/17/2020 0218   BASOSABS 0.0 10/17/2020 0218    CMP     Component Value Date/Time   NA 142 10/17/2020 0227   K 4.0 10/17/2020 0227   CL 108 10/17/2020 0227   CO2 26 10/17/2020 0218   GLUCOSE 107 (H) 10/17/2020 0227   BUN 24 (H) 10/17/2020 0227   CREATININE 1.10 10/17/2020 0227   CALCIUM 9.4 10/17/2020 0218   PROT 7.0 10/17/2020 0218   ALBUMIN 4.2 10/17/2020 0218   AST 27 10/17/2020 0218   ALT 28 10/17/2020 0218   ALKPHOS 75 10/17/2020 0218   BILITOT 0.4 10/17/2020 0218   GFRNONAA >60 10/17/2020 0218    Imaging I have reviewed the images obtained:  CT-scan of the brain-negative for acute process.  Aspects 10 CT head and neck with no evidence of emergent large vessel occlusion.  Focal severe stenosis of the horizontal petrous right ICA, multifocal severe left P2 stenosis, occlusion of the left V4 just as it courses into the cranial vault with dominant right vertebral artery that is widely patent.  Mild atheromatous disease about the major arterial vasculature within the neck with no flow-limiting stenosis within the neck. CT perfusion study showed 7 cc of perfusion deficit within the left cerebellar hemisphere-questionably could be in keeping with the occluded left V4 segment.  Prior CT and CTA head and neck September 2020-reviewed in PACS from [facility] under Patient ID: [id number] [facility] CT perfusion at that time showed bitemporal decreased perfusion with left worse than right. The left PCA did not seem to have multifocal stenoses then but has now raising suspicion for some sort of vasculitic versus R CVS phenomenon  MRI examination of the brain-stat-no evidence of stroke  IMPRESSION: 1.  No acute intracranial abnormality. 2. 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 Overall, appearance is similar as compared to previous MRI from 03/05/2020.  Assessment: 65 year old with above past medical history including that of a TIA in September 2021 for which she presented with symptoms of headache, left-sided subjective numbness that resolved quickly but today presents with headache followed by left-sided subjective numbness and still no resolution of symptoms. But this time he was hypertensive to the 180s to 190s systolic. Hypertensive emergency is top of the differential. Reviewing his vessel imaging, and comparing his CTA September 2021 to today, there appears to be left PCA multifocal stenosis almost in a sausagelike pattern making one wonder if this is some sort of a vasculitic/RCVS (reversible cerebral vasoconstriction syndrome) kind of phenomenon.  Also CT perfusion imaging in 2021 and today showed different patterns of diminished perfusion without acute stroke on MRI Further work-up for vasculitis/reversible vasoconstriction would be helpful at this time.  Impression: Evaluate for hypertensive emergency Evaluate for CNS vasculitis/reversible cerebral vasoconstriction syndrome (RCVS) Evaluate for TIA secondary to above Headache- question related to CNS vasculitis  Recommendations: -Would recommend admission for observation -Blood pressure  goal- tricky given differentials that include hypertensive emergency versus TIA.  I would recommend systolic blood pressure goal less than 180 for now. -Continue aspirin -Continue statin -2D echo -A1c -Lipid panel -Frequent neurochecks -Telemetry -PT -OT -Speech therapy -May need consideration for diagnostic cerebral angiogram after all the work-up is completed-may be as an outpatient.  Vasculitic work-up to include:   Serum  Antinuclear antibodies (ANA)  Antibodies to the Ro/SSA, La/SSB, Sm, and RNP antigens  Antibodies to double-stranded DNA  Antiphospholipid antibodies (lupus anticoagulant [LA], IgG, and IgM anticardiolipin [aCL] antibodies; and IgG and IgM anti-beta2-glycoprotein [GP] I)  Antineutrophil cytoplasmic antibodies (ANCA)  Serum C3 and C4  Serum cryoglobulins  Serum and urine protein electrophoresis with immune electrophoresis  Hepatitis B and C viruses  HIV  CSF:  Lumbar puncture for CSF protein, glucose, cell count, IgG index and oligoclonal bands  Discussed with Dr. Jacqulyn Bath in the emergency room  Stroke team will follow.  -- Milon Dikes, MD Neurologist [facility] Pager: [phone]

## 2020-10-17 NOTE — ED Notes (Signed)
Admitting MD at bedside.

## 2020-10-17 NOTE — Progress Notes (Signed)
STROKE TEAM PROGRESS NOTE   SUBJECTIVE (INTERVAL HISTORY) His wife is at the bedside. Pt recounted HPI with me. In 02/2020 pt had 5 sneezes, after that he had vertigo, room spinning, by the time he arrived to [facility] ED, symptoms resolved. MRI was neg but his CTA head and neck showed left VA distal occlusion vs. High grade stenosis. This time, he again sneezed hard twice and he had severe pain at left temporal region, associated with vertigo, difficulty swallowing, hoarseness, difficulty walking. The HA lasted about 15-20 min resovled, but vertigo continued, motion makes it worse. In ER, he also stated intermittent hiccup. On exam, he had left UE ataxia and upper beating nystagmus on bilateral gaze, more prominent with left gaze. His CTA head and neck again showed left V4 occlusion and seems smaller V3 comparing with before. 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 Will need to repeat MRI brain and plan for cerebral angio on Monday. His ESR and CRP normal, low suspicious for vasculitis at this time, will cancel LP.    OBJECTIVE Temp:  [97.5 F (36.4 C)-98.6 F (37 C)] 98.6 F (37 C) (05/06 1955) Pulse Rate:  [49-95] 95 (05/06 1955) Cardiac Rhythm: Normal sinus rhythm (05/06 1900) Resp:  [7-19] 16 (05/06 1955) BP: (154-198)/(68-124) 167/93 (05/06 1955) SpO2:  [92 %-99 %] 97 % (05/06 1955) Weight:  [120.2 kg] 120.2 kg (05/06 1806)  Recent Labs  Lab 10/17/20 0218  GLUCAP 112*   Recent Labs  Lab 10/17/20 0218 10/17/20 0227  NA 141 142  K 4.1 4.0  CL 106 108  CO2 26  --   GLUCOSE 105* 107*  BUN 24* 24*  CREATININE 1.17 1.10  CALCIUM 9.4  --    Recent Labs  Lab 10/17/20 0218  AST 27  ALT 28  ALKPHOS 75  BILITOT 0.4  PROT 7.0  ALBUMIN 4.2   Recent Labs  Lab 10/17/20 0218 10/17/20 0227  WBC 7.0  --   NEUTROABS 4.4  --   HGB 13.9 13.9  HCT 43.9 41.0  MCV 88.9  --   PLT 151  --    No results for input(s): CKTOTAL, CKMB, CKMBINDEX, TROPONINI in the last 168  hours. Recent Labs    10/17/20 0218  LABPROT 13.5  INR 1.0   No results for input(s): COLORURINE, LABSPEC, PHURINE, GLUCOSEU, HGBUR, BILIRUBINUR, KETONESUR, PROTEINUR, UROBILINOGEN, NITRITE, LEUKOCYTESUR in the last 72 hours.  Invalid input(s): APPERANCEUR     Component Value Date/Time   CHOL 156 10/17/2020 1017   TRIG 189 (H) 10/17/2020 1017   HDL 38 (L) 10/17/2020 1017   CHOLHDL 4.1 10/17/2020 1017   VLDL 38 10/17/2020 1017   LDLCALC 80 10/17/2020 1017   Lab Results  Component Value Date   HGBA1C 5.8 (H) 10/17/2020   No results found for: LABOPIA, COCAINSCRNUR, LABBENZ, AMPHETMU, THCU, LABBARB  No results for input(s): ETH in the last 168 hours.  I have personally reviewed the radiological images below and agree with the radiology interpretations.  MR BRAIN WO CONTRAST  Result Date: 10/17/2020 CLINICAL DATA:  Initial evaluation for acute stroke. EXAM: MRI HEAD WITHOUT CONTRAST TECHNIQUE: Multiplanar, multiecho pulse sequences of the brain and surrounding structures were obtained without intravenous contrast. COMPARISON:  Prior studies from earlier the same day as well as previous MRI from 03/05/2020. FINDINGS: Brain: Examination degraded by motion artifact. Generalized age-related cerebral atrophy. Patchy T2/FLAIR hyperintensity within the periventricular and deep white matter both cerebral hemispheres most consistent with chronic small vessel ischemic  disease, moderate in nature. Superimposed remote lacunar infarct present at the right thalamus. No abnormal foci of restricted diffusion to suggest acute or subacute ischemia. Gray-white matter differentiation maintained. No encephalomalacia to suggest chronic cortical infarction. No evidence for acute or chronic intracranial hemorrhage. No mass lesion, midline shift or mass effect. No hydrocephalus or extra-axial fluid collection. Pituitary gland and suprasellar region within normal limits. Midline structures intact. Vascular: Absent  flow void within the left V4 segment, consistent with previously identified occlusion. Major intracranial vascular flow voids are otherwise maintained. Skull and upper cervical spine: Craniocervical junction within normal limits. Upper cervical spine normal. Bone marrow signal intensity within normal limits. No focal marrow replacing lesion. No scalp soft tissue abnormality. Sinuses/Orbits: Globes orbital soft tissues demonstrate no acute finding. Mild scattered mucosal thickening noted within the ethmoidal air cells and maxillary sinuses. Paranasal sinuses are otherwise clear. No mastoid effusion. Inner ear structures grossly normal. Other: None. IMPRESSION: 1. No acute intracranial abnormality. 2. 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 Overall, appearance is similar as compared to previous MRI from 03/05/2020. Electronically Signed   By: Jeannine Boga M.D.   On: 10/17/2020 04:23   CT HEAD CODE STROKE WO CONTRAST  Result Date: 10/17/2020 CLINICAL DATA:  Code stroke. Initial evaluation for neuro deficit, stroke suspected. EXAM: CT HEAD WITHOUT CONTRAST TECHNIQUE: Contiguous axial images were obtained from the base of the skull through the vertex without intravenous contrast. COMPARISON:  None. FINDINGS: Brain: Age-related cerebral atrophy with moderate chronic microvascular ischemic disease. Small remote lacunar infarct at the right thalamus. No acute intracranial hemorrhage. No acute large vessel territory infarct. No mass lesion, midline shift or mass effect. No hydrocephalus or extra-axial fluid collection. Vascular: No hyperdense vessel. Skull: Scalp soft tissues within normal limits.  Calvarium intact. Sinuses/Orbits: Globes and orbital soft tissues demonstrate no acute finding. Paranasal sinuses are largely clear. No mastoid effusion. Other: None. ASPECTS [facility] Stroke Program Early CT Score)  - Ganglionic level infarction (caudate, lentiform nuclei, internal capsule, insula, M1-M3 cortex): 7 - Supraganglionic infarction (M4-M6 cortex): 3 Total score (0-10 with 10 being normal): 10 IMPRESSION: 1. No acute intracranial infarct or other abnormality. 2. ASPECTS is 10. 3. Age-related cerebral atrophy with chronic small vessel ischemic disease. These results were communicated to Dr. Rory Percy at 2:35 amon 5/6/2022by text page via the [facility] messaging system. Electronically Signed   By: Jeannine Boga M.D.   On: 10/17/2020 02:36   CT ANGIO HEAD NECK W WO CM W PERF (CODE STROKE)  Result Date: 10/17/2020 CLINICAL DATA:  Follow-up examination for acute stroke. EXAM: CT ANGIOGRAPHY HEAD AND NECK CT PERFUSION BRAIN TECHNIQUE: Multidetector CT imaging of the head and neck was performed using the standard protocol during bolus administration of intravenous contrast. Multiplanar CT image reconstructions and MIPs were obtained to evaluate the vascular anatomy. Carotid stenosis measurements (when applicable) are obtained utilizing NASCET criteria, using the distal internal carotid diameter as the denominator. Multiphase CT imaging of the brain was performed following IV bolus contrast injection. Subsequent parametric perfusion maps were calculated using RAPID software. CONTRAST:  74m OMNIPAQUE IOHEXOL 350 MG/ML SOLN COMPARISON:  Prior head CT from earlier the same day. FINDINGS: CTA NECK FINDINGS Aortic arch: Examination technically limited by motion and timing of the contrast bolus. Visualized aortic arch normal caliber with normal branch pattern. No stenosis about the origin of the great vessels. Right carotid system: Right CCA patent from its origin to  the bifurcation. Mild mixed plaque about the right bifurcation without significant stenosis. Right ICA patent distally without stenosis, dissection or occlusion. Left carotid system: Left CCA patent from its origin to the bifurcation without stenosis. Mild mixed  plaque about the left bifurcation without significant stenosis. Left ICA widely patent distally without stenosis, dissection or occlusion. Vertebral arteries: Both vertebral arteries arise from subclavian arteries. Right vertebral artery dominant. Origin and proximal left vertebral artery not well evaluated due to adjacent venous contamination. Visualized portions of the vertebral arteries patent within the neck without appreciable stenosis or other acute vascular abnormality. Skeleton: No acute osseous abnormality. No discrete or worrisome osseous lesions. Other neck: No other visible acute soft tissue abnormality within the neck. No mass or adenopathy. Upper chest: Visualized upper chest demonstrates no acute finding. Review of the MIP images confirms the above findings CTA HEAD FINDINGS Anterior circulation: Focal severe stenosis involving the horizontal petrous right ICA (series 7, image 127). Petrous left ICA widely patent. Additional scattered plaque within the carotid siphons with no more than mild stenosis elsewhere. A1 segments patent bilaterally. Normal anterior communicating artery complex. Anterior cerebral arteries patent to their distal aspects without appreciable stenosis. No M1 stenosis or occlusion. Normal MCA bifurcations. Distal MCA branches well perfused. Posterior circulation: Dominant right V4 segment patent to the vertebrobasilar junction without stenosis. Right PICA patent. Left vertebral artery largely occludes just as it courses into the cranial vault. Left PICA is perfused at the skull base. Basilar patent to its distal aspect without stenosis. Superior cerebellar arteries patent bilaterally. Both PCAs primarily supplied via the basilar. Multifocal atheromatous irregularity seen throughout the PCAs bilaterally. Associated multifocal severe left P2 stenoses (series 10, image 21). PCAs otherwise perfused to their distal aspects without proximal stenosis. Venous sinuses: Grossly patent  allowing for timing the contrast bolus. Anatomic variants: None significant. No visible aneurysm. Review of the MIP images confirms the above findings CT Brain Perfusion Findings: ASPECTS: 10. CBF (<30%) Volume: 55m Perfusion (Tmax>6.0s) volume: 718mMismatch Volume: 48m82mnfarction Location:Negative CT perfusion for acute ischemia. Delayed perfusion seen within the left cerebellar hemisphere related to the occluded left vertebral artery. IMPRESSION: CTA HEAD AND NECK IMPRESSION: 1. Negative CTA for emergent large vessel occlusion. 2. 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 3. Multifocal severe left P2 stenoses. 4. 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 Dominant right vertebral artery widely patent. 5. Mild atheromatous disease about the major arterial vasculature within the neck. No flow-limiting stenosis within the neck. CT PERFUSION IMPRESSION: 1. Negative CT perfusion for acute ischemia. 2. 7 cc perfusion deficit within the left cerebellar hemisphere, in keeping with the occluded left V4 segment. Electronically Signed   By: BenJeannine BogaD.   On: 10/17/2020 03:57    PHYSICAL EXAM  Temp:  [97.5 F (36.4 C)-98.6 F (37 C)] 98.6 F (37 C) (05/06 1955) Pulse Rate:  [49-95] 95 (05/06 1955) Resp:  [7-19] 16 (05/06 1955) BP: (154-198)/(68-124) 167/93 (05/06 1955) SpO2:  [92 %-99 %] 97 % (05/06 1955) Weight:  [120.2 kg] 120.2 kg (05/06 1806)  General - Well nourished, well developed, in acute distress due to vertigo.  Ophthalmologic - fundi not visualized due to noncooperation.  Cardiovascular - Regular rhythm and rate.  Mental Status -  Level of arousal and orientation to time, place, and person were intact. Language including expression, naming, repetition, comprehension was assessed and found intact. Fund of Knowledge was assessed and was intact.  Cranial Nerves II - XII - II - Visual field intact OU. III, IV, VI -  Extraocular movements intact. V - Facial light touch sensation intact bilaterally, but feeling left facial tingling. VII - Facial movement intact bilaterally. VIII - Hearing intact bilaterally. Upbeat nystagmus on bilateral horizontal gaze, more prominent on the left gaze X - Palate elevates symmetrically. Pt does have hoarseness. And complaining of swallow difficulty and intermittent hiccup XI - Chin turning & shoulder shrug intact bilaterally. XII - Tongue protrusion intact.  Motor Strength - The patient's strength was normal in all extremities and pronator drift was absent.  Bulk was normal and fasciculations were absent.   Motor Tone - Muscle tone was assessed at the neck and appendages and was normal.  Reflexes - The patient's reflexes were symmetrical in all extremities and he had no pathological reflexes.  Sensory - Light touch, temperature/pinprick were assessed and were symmetrical.    Coordination - The patient had normal movements in the right hand and leg with no ataxia or dysmetria. However, left FTN ataxia and HTS mild dysmetria Tremor was absent.  Gait and Station - deferred.   ASSESSMENT/PLAN Mr. Dennis Jasinski Sr. is a 65 y.o. male with history of hypertension, hyperlipidemia, TIA 02/2020 admitted for transient left temporal headache, vertigo, swallowing difficulty, hoarseness, left-sided numbness and discoordination after 2 bad sneezes. No tPA given due to mild symptoms.    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  Focal severe stenosis right petrous ICA, tandem stenosis severe left P2  MR repeat pending  Plan for cerebral angiogram next Monday  2D Echo  pending  LDL 80  HgbA1c 5.8  ESR 5 and CRP 0.6  Hypercoagulable and autoimmune labs pending  Lovenox for VTE prophylaxis  aspirin 81 mg daily prior to admission, now on aspirin 325 mg daily and clopidogrel 75 mg daily.  Continue DAPT for 3 months and then Plavix alone given intracranial stenosis/occlusion.  Patient counseled to be compliant with his antithrombotic medications  Ongoing aggressive stroke risk factor management  Therapy recommendations: Pending  Disposition: Pending  History of TIA  02/2020 admitted to [facility] for similar episode with difficulty walking, left-sided numbness tingling, dizziness after 5 episode of sneeze.  CTA head and neck was reported normal however, on further review, patient ready had right petrous ICA severe stenosis, left V4 occlusion.  Left P2 stenosis present however not reported likely due to venous contamination.  MRI no acute infarct.  2D echo unremarkable.  Patient symptoms resolved in ED at the time.  Patient discharged with DAPT and Lipitor 80.  Now we think it back, concerning that episode likely due to left VA dissection / occlusion after sneeze  Hypertension . Stable on the high end . Permissive hypertension (OK if <220/120) for 24-48 hours post stroke and then gradually reach goal within 5-7 days.  Long term BP goal 130-150 given multifocal intracranial severe stenosis.  Hyperlipidemia  Home meds: Crestor 20  LDL 80, goal < 70  Now on Crestor 40  Continue statin at discharge  Other Stroke Risk Factors  Advanced age  Obesity, Body mass index is 34.96 kg/m.   Other [facility]  day # 0  I discussed with Dr. Norma Fredrickson and Dr. Malen Gauze. I spent  35 minutes in total face-to-face time with the patient, more than 50% of which was spent in counseling and coordination of care, reviewing test results, images and medication, and discussing the diagnosis, treatment plan and potential prognosis.  This patient's care requiresreview of multiple databases, neurological assessment, discussion with family, other specialists and medical decision making of high complexity. I had long discussion with patient and wife at bedside, updated pt current condition, treatment plan and potential prognosis, and answered all the questions.  They expressed understanding and appreciation.    Rosalin Hawking, MD PhD Stroke Neurology 10/17/2020 8:48 PM    To contact Stroke Continuity provider, please refer to [email or web address] After hours, contact General Neurology

## 2020-10-17 NOTE — ED Provider Notes (Signed)
Emergency Department Provider Note   I have reviewed the triage vital signs and the nursing notes.   HISTORY  Chief Complaint No chief complaint on file.   HPI Dennis BisJeffery Dalgleish Sr. is a 65 y.o. male with past medical history of prior TIA/CVA presents to the ED with new gait instability and HA. LSN at 11 PM. Patient awoke at 1AM with left sided HA and trouble walking. Notes he has baseline numbness to the left face and voice changes which are old. No CP, SOB, abdominal pain. EMS was called and activated a CODE STROKE. No intervention en route. Patient takes an ASA daily but not anticoagulated.    No past medical history on file.  Patient Active Problem List   Diagnosis Date Noted  . Vertigo 10/17/2020   Allergies Patient has no allergy information on record.  No family history on file.  Social History    Review of Systems  Constitutional: No fever/chills Eyes: No visual changes. ENT: No sore throat. Cardiovascular: Denies chest pain. Respiratory: Denies shortness of breath. Gastrointestinal: No abdominal pain.  No nausea, no vomiting.  No diarrhea.  No constipation. Genitourinary: Negative for dysuria. Musculoskeletal: Negative for back pain. Skin: Negative for rash. Neurological: Negative for focal weakness or numbness. Positive HA and gait instability.   10-point ROS otherwise negative.  ____________________________________________   PHYSICAL EXAM:  VITAL SIGNS: Vitals:   10/17/20 0600 10/17/20 0630  BP: (!) 167/68 (!) 154/91  Pulse: 62 (!) 58  Resp: 14 12  Temp:    SpO2: 98% 99%    Constitutional: Alert and oriented. Well appearing and in no acute distress. Eyes: Conjunctivae are normal.  Head: Atraumatic. Nose: No congestion/rhinnorhea. Mouth/Throat: Mucous membranes are moist.  Neck: No stridor.  Cardiovascular: Normal rate, regular rhythm. Good peripheral circulation. Grossly normal heart sounds.   Respiratory: Normal respiratory effort.  No  retractions. Lungs CTAB. Gastrointestinal: Soft and nontender. No distention.  Musculoskeletal: No lower extremity tenderness nor edema. No gross deformities of extremities. Neurologic:  Normal speech and language. No gross focal neurologic deficits are appreciated.  Skin:  Skin is warm, dry and intact. No rash noted.   ____________________________________________   LABS (all labs ordered are listed, but only abnormal results are displayed)  Labs Reviewed  COMPREHENSIVE METABOLIC PANEL - Abnormal; Notable for the following components:      Result Value   Glucose, Bld 105 (*)    BUN 24 (*)    All other components within normal limits  I-STAT CHEM 8, ED - Abnormal; Notable for the following components:   BUN 24 (*)    Glucose, Bld 107 (*)    Calcium, Ion 1.08 (*)    All other components within normal limits  CBG MONITORING, ED - Abnormal; Notable for the following components:   Glucose-Capillary 112 (*)    All other components within normal limits  PROTIME-INR  APTT  CBC  DIFFERENTIAL  PROTEIN AND GLUCOSE, CSF  CSF CELL COUNT WITH DIFFERENTIAL  CSF CELL COUNT WITH DIFFERENTIAL  IGG CSF INDEX  DRAW EXTRA CLOT TUBE  OLIGOCLONAL BANDS, CSF + SERM  DRAW EXTRA CLOT TUBE  EXTRACTABLE NUCLEAR ANTIGEN ANTIBODY  ANTI-JO 1 ANTIBODY, IGG  C3 COMPLEMENT  C4 COMPLEMENT  CRYOGLOBULIN  PROTEIN ELECTROPHORESIS, SERUM  UPEP/UIFE/LIGHT CHAINS/TP, 24-HR UR  HEPATITIS PANEL, ACUTE  HIV ANTIBODY (ROUTINE TESTING W REFLEX)  ANTIPHOSPHOLIPID SYNDROME EVAL, BLD  BETA-2-GLYCOPROTEIN I ABS, IGG/M/A   ____________________________________________  EKG   EKG Interpretation  Date/Time:  Friday Oct 17 2020 03:43:01 EDT Ventricular Rate:  58 PR Interval:  170 QRS Duration: 115 QT Interval:  486 QTC Calculation: 478 R Axis:   18 Text Interpretation: Sinus rhythm LVH with secondary repolarization abnormality Borderline prolonged QT interval Confirmed by Alona Bene [phone]) on 10/17/2020  7:08:15 AM       ____________________________________________  RADIOLOGY  MR BRAIN WO CONTRAST  Result Date: 10/17/2020 CLINICAL DATA:  Initial evaluation for acute stroke. EXAM: MRI HEAD WITHOUT CONTRAST TECHNIQUE: Multiplanar, multiecho pulse sequences of the brain and surrounding structures were obtained without intravenous contrast. COMPARISON:  Prior studies from earlier the same day as well as previous MRI from 03/05/2020. FINDINGS: Brain: Examination degraded by motion artifact. Generalized age-related cerebral atrophy. Patchy T2/FLAIR hyperintensity within the periventricular and deep white matter both cerebral hemispheres most consistent with chronic small vessel ischemic disease, moderate in nature. Superimposed remote lacunar infarct present at the right thalamus. No abnormal foci of restricted diffusion to suggest acute or subacute ischemia. Gray-white matter differentiation maintained. No encephalomalacia to suggest chronic cortical infarction. No evidence for acute or chronic intracranial hemorrhage. No mass lesion, midline shift or mass effect. No hydrocephalus or extra-axial fluid collection. Pituitary gland and suprasellar region within normal limits. Midline structures intact. Vascular: Absent flow void within the left V4 segment, consistent with previously identified occlusion. Major intracranial vascular flow voids are otherwise maintained. Skull and upper cervical spine: Craniocervical junction within normal limits. Upper cervical spine normal. Bone marrow signal intensity within normal limits. No focal marrow replacing lesion. No scalp soft tissue abnormality. Sinuses/Orbits: Globes orbital soft tissues demonstrate no acute finding. Mild scattered mucosal thickening noted within the ethmoidal air cells and maxillary sinuses. Paranasal sinuses are otherwise clear. No mastoid effusion. Inner ear structures grossly normal. Other: None. IMPRESSION: 1. No acute intracranial abnormality. 2.  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 Overall, appearance is similar as compared to previous MRI from 03/05/2020. Electronically Signed   By: Rise Mu M.D.   On: 10/17/2020 04:23   CT HEAD CODE STROKE WO CONTRAST  Result Date: 10/17/2020 CLINICAL DATA:  Code stroke. Initial evaluation for neuro deficit, stroke suspected. EXAM: CT HEAD WITHOUT CONTRAST TECHNIQUE: Contiguous axial images were obtained from the base of the skull through the vertex without intravenous contrast. COMPARISON:  None. FINDINGS: Brain: Age-related cerebral atrophy with moderate chronic microvascular ischemic disease. Small remote lacunar infarct at the right thalamus. No acute intracranial hemorrhage. No acute large vessel territory infarct. No mass lesion, midline shift or mass effect. No hydrocephalus or extra-axial fluid collection. Vascular: No hyperdense vessel. Skull: Scalp soft tissues within normal limits.  Calvarium intact. Sinuses/Orbits: Globes and orbital soft tissues demonstrate no acute finding. Paranasal sinuses are largely clear. No mastoid effusion. Other: None. ASPECTS [facility] Stroke Program Early CT Score) - Ganglionic level infarction (caudate, lentiform nuclei, internal capsule, insula, M1-M3 cortex): 7 - Supraganglionic infarction (M4-M6 cortex): 3 Total score (0-10 with 10 being normal): 10 IMPRESSION: 1. No acute intracranial infarct or other abnormality. 2. ASPECTS is 10. 3. Age-related cerebral atrophy with chronic small vessel ischemic disease. These results were communicated to Dr. Wilford Corner at 2:35 amon 5/6/2022by text page via the [facility] messaging system. Electronically Signed   By: Rise Mu M.D.   On: 10/17/2020 02:36   CT ANGIO HEAD NECK W WO CM W PERF (CODE STROKE)  Result Date: 10/17/2020 CLINICAL DATA:  Follow-up examination for acute stroke. EXAM: CT ANGIOGRAPHY HEAD AND  NECK  CT PERFUSION BRAIN TECHNIQUE: Multidetector CT imaging of the head and neck was performed using the standard protocol during bolus administration of intravenous contrast. Multiplanar CT image reconstructions and MIPs were obtained to evaluate the vascular anatomy. Carotid stenosis measurements (when applicable) are obtained utilizing NASCET criteria, using the distal internal carotid diameter as the denominator. Multiphase CT imaging of the brain was performed following IV bolus contrast injection. Subsequent parametric perfusion maps were calculated using RAPID software. CONTRAST:  53mL OMNIPAQUE IOHEXOL 350 MG/ML SOLN COMPARISON:  Prior head CT from earlier the same day. FINDINGS: CTA NECK FINDINGS Aortic arch: Examination technically limited by motion and timing of the contrast bolus. Visualized aortic arch normal caliber with normal branch pattern. No stenosis about the origin of the great vessels. Right carotid system: Right CCA patent from its origin to the bifurcation. Mild mixed plaque about the right bifurcation without significant stenosis. Right ICA patent distally without stenosis, dissection or occlusion. Left carotid system: Left CCA patent from its origin to the bifurcation without stenosis. Mild mixed plaque about the left bifurcation without significant stenosis. Left ICA widely patent distally without stenosis, dissection or occlusion. Vertebral arteries: Both vertebral arteries arise from subclavian arteries. Right vertebral artery dominant. Origin and proximal left vertebral artery not well evaluated due to adjacent venous contamination. Visualized portions of the vertebral arteries patent within the neck without appreciable stenosis or other acute vascular abnormality. Skeleton: No acute osseous abnormality. No discrete or worrisome osseous lesions. Other neck: No other visible acute soft tissue abnormality within the neck. No mass or adenopathy. Upper chest: Visualized upper chest demonstrates  no acute finding. Review of the MIP images confirms the above findings CTA HEAD FINDINGS Anterior circulation: Focal severe stenosis involving the horizontal petrous right ICA (series 7, image 127). Petrous left ICA widely patent. Additional scattered plaque within the carotid siphons with no more than mild stenosis elsewhere. A1 segments patent bilaterally. Normal anterior communicating artery complex. Anterior cerebral arteries patent to their distal aspects without appreciable stenosis. No M1 stenosis or occlusion. Normal MCA bifurcations. Distal MCA branches well perfused. Posterior circulation: Dominant right V4 segment patent to the vertebrobasilar junction without stenosis. Right PICA patent. Left vertebral artery largely occludes just as it courses into the cranial vault. Left PICA is perfused at the skull base. Basilar patent to its distal aspect without stenosis. Superior cerebellar arteries patent bilaterally. Both PCAs primarily supplied via the basilar. Multifocal atheromatous irregularity seen throughout the PCAs bilaterally. Associated multifocal severe left P2 stenoses (series 10, image 21). PCAs otherwise perfused to their distal aspects without proximal stenosis. Venous sinuses: Grossly patent allowing for timing the contrast bolus. Anatomic variants: None significant. No visible aneurysm. Review of the MIP images confirms the above findings CT Brain Perfusion Findings: ASPECTS: 10. CBF (<30%) Volume: 35mL Perfusion (Tmax>6.0s) volume: 5mL Mismatch Volume: 27mL Infarction Location:Negative CT perfusion for acute ischemia. Delayed perfusion seen within the left cerebellar hemisphere related to the occluded left vertebral artery. IMPRESSION: CTA HEAD AND NECK IMPRESSION: 1. Negative CTA for emergent large vessel occlusion. 2. 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 3. Multifocal severe left P2 stenoses. 4. 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 Dominant right vertebral artery widely patent. 5. Mild atheromatous disease about the major arterial vasculature within the neck. No flow-limiting stenosis within the neck. CT PERFUSION IMPRESSION: 1. Negative CT perfusion for acute ischemia. 2. 7 cc perfusion deficit within the left cerebellar hemisphere, in  keeping with the occluded left V4 segment. Electronically Signed   By: Rise Mu M.D.   On: 10/17/2020 03:57    ____________________________________________   PROCEDURES  Procedure(s) performed:   .Lumbar Puncture  Date/Time: 10/17/2020 7:08 AM Performed by: Maia Plan, MD Authorized by: Maia Plan, MD   Consent:    Consent obtained:  Written   Consent given by:  Patient   Risks discussed:  Bleeding, headache, infection, nerve damage, pain and repeat procedure   Alternatives discussed:  No treatment Universal protocol:    Patient identity confirmed:  Verbally with patient and arm band Pre-procedure details:    Procedure purpose:  Diagnostic   Preparation: Patient was prepped and draped in usual sterile fashion   Anesthesia:    Anesthesia method:  Local infiltration   Local anesthetic:  Lidocaine 1% w/o epi Procedure details:    Lumbar space:  L4-L5 interspace   Patient position:  L lateral decubitus   Needle gauge:  18   Needle type:  Spinal needle - Quincke tip   Needle length (in):  3.5   Number of attempts:  3 Comments:     Unsuccessful x 3.      ____________________________________________   INITIAL IMPRESSION / ASSESSMENT AND PLAN / ED COURSE  Pertinent labs & imaging results that were available during my care of the patient were reviewed by me and considered in my medical decision making (see chart for details).   Patient arrives to the emergency department activated as a code stroke by EMS.  He has new gait instability and left-sided headache.  Last seen normal at 11 PM.  He is awake, alert, participating with the exam.  No focal  neurodeficits at the bridge.  Will go immediately to CT with neurology.   CTA and MRI reviewed. Discussed with Neurology with concern for possible vasculitis. Attempted LP but not successful. Patient tolerated well with no immediate complications.   Discussed patient's case with [facility] to request admission. Patient and family (if present) updated with plan. Care transferred to [facility] service.  I reviewed all nursing notes, vitals, pertinent old records, EKGs, labs, imaging (as available).  ____________________________________________  FINAL CLINICAL IMPRESSION(S) / ED DIAGNOSES  Final diagnoses:  Vertigo     MEDICATIONS GIVEN DURING THIS VISIT:  Medications  sodium chloride flush (NS) 0.9 % injection 3 mL (3 mLs Intravenous Not Given 10/17/20 0645)  iohexol (OMNIPAQUE) 350 MG/ML injection 75 mL (75 mLs Intravenous Contrast Given 10/17/20 0247)  hydrALAZINE (APRESOLINE) injection 5 mg (5 mg Intravenous Given 10/17/20 0625)  lidocaine (PF) (XYLOCAINE) 1 % injection (  Given 10/17/20 0645)    Note:  This document was prepared using Dragon voice recognition software and may include unintentional dictation errors.  Alona Bene, MD, [facility] Emergency Medicine    Amarie Viles, Arlyss Repress, MD 10/17/20 [phone]

## 2020-10-17 NOTE — Consult Note (Signed)
Chief Complaint: Patient was seen in consultation today for intracranial stenosis vs occlusion/diagnostic cerebral arteriogram.  Referring Physician(s): Marvel Plan (neurology)  Supervising Physician: Baldemar Lenis  Patient Status: [facility] - ED  History of Present Illness: Dennis Strickland. is a 65 y.o. male with a past medical history of hypertension, dyslipidemia, TIA 02/2020, pre-diabetes, and BPH. Over the past week, patient has been experiencing intermittent vertigo. At approximately 2 AM today, patient was awoken from his sleep secondary to a severe headache. Patient tried to get up and walk, and had issues with coordination. In addition, was experiencing left-sided numbness. He called EMS and was brought to [facility] ED for further evaluation. In ED, a code stroke was activated and patient was seen by neurology. CT head/MR brain negative for acute infarct. CTA head/neck concerning for stenosis vs occlusion (right ICA petrous severe stenosis, left PCA P2 severe stenosis, left VA V4 occlusion). Possible concern for vasculitis- LP attempted in ED but unsuccessful. He was admitted for further management.  MR brain today: 1. No acute intracranial abnormality. 2. 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 Overall, appearance is similar as compared to previous MRI from 03/05/2020.  CTA head/neck today: 1. Negative CTA for emergent large vessel occlusion. 2. 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 3. Multifocal severe left P2 stenoses. 4. 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 Dominant right vertebral artery widely patent. 5. Mild atheromatous disease about the major arterial vasculature within the neck. No flow-limiting stenosis within the neck.  CT head today: 1. No acute intracranial infarct or other abnormality. 2.  ASPECTS is 10. 3. Age-related cerebral atrophy with chronic small vessel ischemic disease.  NIR consulted by Dr. Roda Shutters for possible image-guided diagnostic cerebral arteriogram. Patient awake and alert laying in bed. Wife at bedside. Hypertensive (220s/160s). Complains of constant dizziness/vertigo. States he has intermittent nausea, vomited earlier this AM. Complains of intermittent left-sided numbness. States headache has subsided, however has constant photophobia left-sided blurred vision. Denies fever, chills, chest pain, dyspnea, or abdominal pain.  On SQ Lovenox QD and Aspirin 325 mg QD.   Past Medical History:  Diagnosis Date  . BPH (benign prostatic hyperplasia)   . Dyslipidemia   . Hypertension   . Impaired glucose tolerance   . TIA (transient ischemic attack) 02/2020    Past Surgical History:  Procedure Laterality Date  . TOTAL KNEE ARTHROPLASTY      Allergies: Patient has no known allergies.  Medications: Prior to Admission medications   Medication Sig Start Date End Date Taking? Authorizing Provider  acetaminophen (TYLENOL) 650 MG CR tablet Take 1,300 mg by mouth in the morning and at bedtime.   Yes [provider]  aspirin EC 81 MG tablet Take 162 mg by mouth in the morning and at bedtime. Swallow whole.   Yes [provider]  Calcium Carb-Cholecalciferol 500-400 MG-UNIT TABS Take 1 tablet by mouth daily.   Yes [provider]  cholecalciferol (VITAMIN D) 25 MCG (1000 UNIT) tablet Take 1,000 Units by mouth daily.   Yes [provider]  finasteride (PROSCAR) 5 MG tablet Take 5 mg by mouth daily. 09/02/20  Yes [provider]  gabapentin (NEURONTIN) 300 MG capsule Take 300 mg by mouth daily. 09/24/20  Yes [provider]  lisinopril (ZESTRIL) 20 MG tablet Take 20 mg by mouth 2 (two) times daily.  Yes [provider]  metFORMIN (GLUCOPHAGE) 500 MG tablet Take 500 mg by mouth 2 (two) times daily. 07/24/20   Yes [provider]  rosuvastatin (CRESTOR) 10 MG tablet Take 10 mg by mouth at bedtime. 10/15/20  Yes [provider]  tamsulosin (FLOMAX) 0.4 MG CAPS capsule Take 0.8 mg by mouth daily. 09/22/20  Yes [provider]  vitamin C (ASCORBIC ACID) 500 MG tablet Take 500 mg by mouth daily.   Yes [provider]     Family History  Problem Relation Age of Onset  . Hypertension Mother   . CVA Sister   . Lupus Sister   . CVA Brother [id number]    Social History   Socioeconomic History  . Marital status: Married    Spouse name: Not on file  . Number of children: Not on file  . Years of education: Not on file  . Highest education level: Not on file  Occupational History  . Not on file  Tobacco Use  . Smoking status: Never Smoker  . Smokeless tobacco: Never Used  Substance and Sexual Activity  . Alcohol use: Never  . Drug use: Never  . Sexual activity: Not on file  Other Topics Concern  . Not on file  Social History Narrative  . Not on file   Social Determinants of Health   Financial Resource Strain: Not on file  Food Insecurity: Not on file  Transportation Needs: Not on file  Physical Activity: Not on file  Stress: Not on file  Social Connections: Not on file     Review of Systems: A 12 point ROS discussed and pertinent positives are indicated in the HPI above.  All other systems are negative.  Review of Systems  Constitutional: Negative for chills and fever.  Eyes: Positive for visual disturbance.  Respiratory: Negative for shortness of breath and wheezing.   Cardiovascular: Negative for chest pain and palpitations.  Gastrointestinal: Positive for nausea and vomiting. Negative for abdominal pain.  Neurological: Positive for dizziness and numbness. Negative for headaches.  Psychiatric/Behavioral: Negative for behavioral problems and confusion.    Vital Signs: BP (!) 192/96   Pulse 78   Temp 97.9 F (36.6 C) (Oral)   Resp 16   SpO2 94%    Physical Exam Vitals and nursing note reviewed.  Constitutional:      General: He is not in acute distress. Eyes:     Comments: Eyes closed secondary to photophobia.  Cardiovascular:     Rate and Rhythm: Normal rate and regular rhythm.     Heart sounds: Normal heart sounds. No murmur heard.   Pulmonary:     Effort: Pulmonary effort is normal. No respiratory distress.     Breath sounds: Normal breath sounds. No wheezing.  Skin:    General: Skin is warm and dry.  Neurological:     Mental Status: He is alert and oriented to person, place, and time.      MD Evaluation Airway: WNL Heart: WNL Abdomen: WNL Chest/ Lungs: WNL ASA  Classification: 3 Mallampati/Airway Score: Two   Imaging: MR BRAIN WO CONTRAST  Result Date: 10/17/2020 CLINICAL DATA:  Initial evaluation for acute stroke. EXAM: MRI HEAD WITHOUT CONTRAST TECHNIQUE: Multiplanar, multiecho pulse sequences of the brain and surrounding structures were obtained without intravenous contrast. COMPARISON:  Prior studies from earlier the same day as well as previous MRI from 03/05/2020. FINDINGS: Brain: Examination degraded by motion artifact. Generalized age-related cerebral atrophy. Patchy T2/FLAIR hyperintensity within the periventricular and  deep white matter both cerebral hemispheres most consistent with chronic small vessel ischemic disease, moderate in nature. Superimposed remote lacunar infarct present at the right thalamus. No abnormal foci of restricted diffusion to suggest acute or subacute ischemia. Gray-white matter differentiation maintained. No encephalomalacia to suggest chronic cortical infarction. No evidence for acute or chronic intracranial hemorrhage. No mass lesion, midline shift or mass effect. No hydrocephalus or extra-axial fluid collection. Pituitary gland and suprasellar region within normal limits. Midline structures intact. Vascular: Absent flow void within the left V4 segment, consistent with previously  identified occlusion. Major intracranial vascular flow voids are otherwise maintained. Skull and upper cervical spine: Craniocervical junction within normal limits. Upper cervical spine normal. Bone marrow signal intensity within normal limits. No focal marrow replacing lesion. No scalp soft tissue abnormality. Sinuses/Orbits: Globes orbital soft tissues demonstrate no acute finding. Mild scattered mucosal thickening noted within the ethmoidal air cells and maxillary sinuses. Paranasal sinuses are otherwise clear. No mastoid effusion. Inner ear structures grossly normal. Other: None. IMPRESSION: 1. No acute intracranial abnormality. 2. 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 Overall, appearance is similar as compared to previous MRI from 03/05/2020. Electronically Signed   By: Rise Mu M.D.   On: 10/17/2020 04:23   CT HEAD CODE STROKE WO CONTRAST  Result Date: 10/17/2020 CLINICAL DATA:  Code stroke. Initial evaluation for neuro deficit, stroke suspected. EXAM: CT HEAD WITHOUT CONTRAST TECHNIQUE: Contiguous axial images were obtained from the base of the skull through the vertex without intravenous contrast. COMPARISON:  None. FINDINGS: Brain: Age-related cerebral atrophy with moderate chronic microvascular ischemic disease. Small remote lacunar infarct at the right thalamus. No acute intracranial hemorrhage. No acute large vessel territory infarct. No mass lesion, midline shift or mass effect. No hydrocephalus or extra-axial fluid collection. Vascular: No hyperdense vessel. Skull: Scalp soft tissues within normal limits.  Calvarium intact. Sinuses/Orbits: Globes and orbital soft tissues demonstrate no acute finding. Paranasal sinuses are largely clear. No mastoid effusion. Other: None. ASPECTS [facility] Stroke Program Early CT Score) - Ganglionic level infarction (caudate, lentiform nuclei,  internal capsule, insula, M1-M3 cortex): 7 - Supraganglionic infarction (M4-M6 cortex): 3 Total score (0-10 with 10 being normal): 10 IMPRESSION: 1. No acute intracranial infarct or other abnormality. 2. ASPECTS is 10. 3. Age-related cerebral atrophy with chronic small vessel ischemic disease. These results were communicated to Dr. Wilford Corner at 2:35 amon 5/6/2022by text page via the [facility] messaging system. Electronically Signed   By: Rise Mu M.D.   On: 10/17/2020 02:36   CT ANGIO HEAD NECK W WO CM W PERF (CODE STROKE)  Result Date: 10/17/2020 CLINICAL DATA:  Follow-up examination for acute stroke. EXAM: CT ANGIOGRAPHY HEAD AND NECK CT PERFUSION BRAIN TECHNIQUE: Multidetector CT imaging of the head and neck was performed using the standard protocol during bolus administration of intravenous contrast. Multiplanar CT image reconstructions and MIPs were obtained to evaluate the vascular anatomy. Carotid stenosis measurements (when applicable) are obtained utilizing NASCET criteria, using the distal internal carotid diameter as the denominator. Multiphase CT imaging of the brain was performed following IV bolus contrast injection. Subsequent parametric perfusion maps were calculated using RAPID software. CONTRAST:  11mL OMNIPAQUE IOHEXOL 350 MG/ML SOLN COMPARISON:  Prior head CT from earlier the same day. FINDINGS: CTA NECK FINDINGS Aortic arch: Examination technically limited by motion and timing of the contrast bolus. Visualized aortic arch normal caliber with normal branch pattern. No stenosis about the origin of  the great vessels. Right carotid system: Right CCA patent from its origin to the bifurcation. Mild mixed plaque about the right bifurcation without significant stenosis. Right ICA patent distally without stenosis, dissection or occlusion. Left carotid system: Left CCA patent from its origin to the bifurcation without stenosis. Mild mixed plaque about the left bifurcation without significant  stenosis. Left ICA widely patent distally without stenosis, dissection or occlusion. Vertebral arteries: Both vertebral arteries arise from subclavian arteries. Right vertebral artery dominant. Origin and proximal left vertebral artery not well evaluated due to adjacent venous contamination. Visualized portions of the vertebral arteries patent within the neck without appreciable stenosis or other acute vascular abnormality. Skeleton: No acute osseous abnormality. No discrete or worrisome osseous lesions. Other neck: No other visible acute soft tissue abnormality within the neck. No mass or adenopathy. Upper chest: Visualized upper chest demonstrates no acute finding. Review of the MIP images confirms the above findings CTA HEAD FINDINGS Anterior circulation: Focal severe stenosis involving the horizontal petrous right ICA (series 7, image 127). Petrous left ICA widely patent. Additional scattered plaque within the carotid siphons with no more than mild stenosis elsewhere. A1 segments patent bilaterally. Normal anterior communicating artery complex. Anterior cerebral arteries patent to their distal aspects without appreciable stenosis. No M1 stenosis or occlusion. Normal MCA bifurcations. Distal MCA branches well perfused. Posterior circulation: Dominant right V4 segment patent to the vertebrobasilar junction without stenosis. Right PICA patent. Left vertebral artery largely occludes just as it courses into the cranial vault. Left PICA is perfused at the skull base. Basilar patent to its distal aspect without stenosis. Superior cerebellar arteries patent bilaterally. Both PCAs primarily supplied via the basilar. Multifocal atheromatous irregularity seen throughout the PCAs bilaterally. Associated multifocal severe left P2 stenoses (series 10, image 21). PCAs otherwise perfused to their distal aspects without proximal stenosis. Venous sinuses: Grossly patent allowing for timing the contrast bolus. Anatomic variants:  None significant. No visible aneurysm. Review of the MIP images confirms the above findings CT Brain Perfusion Findings: ASPECTS: 10. CBF (<30%) Volume: 31mL Perfusion (Tmax>6.0s) volume: 77mL Mismatch Volume: 68mL Infarction Location:Negative CT perfusion for acute ischemia. Delayed perfusion seen within the left cerebellar hemisphere related to the occluded left vertebral artery. IMPRESSION: CTA HEAD AND NECK IMPRESSION: 1. Negative CTA for emergent large vessel occlusion. 2. 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 3. Multifocal severe left P2 stenoses. 4. 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 Dominant right vertebral artery widely patent. 5. Mild atheromatous disease about the major arterial vasculature within the neck. No flow-limiting stenosis within the neck. CT PERFUSION IMPRESSION: 1. Negative CT perfusion for acute ischemia. 2. 7 cc perfusion deficit within the left cerebellar hemisphere, in keeping with the occluded left V4 segment. Electronically Signed   By: Rise Mu M.D.   On: 10/17/2020 03:57    Labs:  CBC: Recent Labs    10/17/20 0218 10/17/20 0227  WBC 7.0  --   HGB 13.9 13.9  HCT 43.9 41.0  PLT 151  --     COAGS: Recent Labs    10/17/20 0218  INR 1.0  APTT 28    BMP: Recent Labs    10/17/20 0218 10/17/20 0227  NA 141 142  K 4.1 4.0  CL 106 108  CO2 26  --   GLUCOSE 105* 107*  BUN 24* 24*  CALCIUM 9.4  --   CREATININE 1.17 1.10  GFRNONAA >60  --  LIVER FUNCTION TESTS: Recent Labs    10/17/20 0218  BILITOT 0.4  AST 27  ALT 28  ALKPHOS 75  PROT 7.0  ALBUMIN 4.2     Assessment and Plan:  Dizziness/vertigo (with N/V), left-sided numbness, headache (subsided), left-sided blurred vision, photophobia- in setting of right ICA petrous severe stenosis, left PCA P2 severe stenosis, and left VA V4 occlusion; with concerns for possible vasculitis. Plan for image-guided diagnostic  cerebral arteriogram tentatively for Monday 10/20/2020 in IR with Dr. Tommie Sams pending IR scheduling. Patient will be NPO at midnight prior to procedure. Afebrile and WBCs WNL. Ok to proceed with Aspirin use, will hold Lovenox per IR protocol. INR 1.0 today.  Risks and benefits of diagnostic cerebral angiogram were discussed with the patient including, but not limited to bleeding, infection, vascular injury, stroke, or contrast induced renal failure. This interventional procedure involves the use of X-rays and because of the nature of the planned procedure, it is possible that we will have prolonged use of X-ray fluoroscopy. Potential radiation risks to you include (but are not limited to) the following: - A slightly elevated risk for cancer  several years later in life. This risk is typically less than 0.5% percent. This risk is low in comparison to the normal incidence of human cancer, which is 33% for women and 50% for men according to the [employer]. - Radiation induced injury can include skin redness, resembling a rash, tissue breakdown / ulcers and hair loss (which can be temporary or permanent).  The likelihood of either of these occurring depends on the difficulty of the procedure and whether you are sensitive to radiation due to previous procedures, disease, or genetic conditions.  IF your procedure requires a prolonged use of radiation, you will be notified and given written instructions for further action.  It is your responsibility to monitor the irradiated area for the 2 weeks following the procedure and to notify your physician if you are concerned that you have suffered a radiation induced injury.   All of the patient's questions were answered, patient is agreeable to proceed. Consent signed and in IR control room.   Thank you for this interesting consult.  I greatly enjoyed meeting [employer] and look forward to participating in their care.  A copy of this  report was sent to the requesting provider on this date.  Electronically Signed: Elwin Mocha, PA-C 10/17/2020, 2:46 PM   I spent a total of 20 Minutes in face to face in clinical consultation, greater than 50% of which was counseling/coordinating care for intracranial stenosis vs occlusion/diagnostic cerebral arteriogram.

## 2020-10-18 ENCOUNTER — Inpatient Hospital Stay (HOSPITAL_COMMUNITY): Payer: Medicare Other

## 2020-10-18 DIAGNOSIS — I6389 Other cerebral infarction: Secondary | ICD-10-CM

## 2020-10-18 LAB — GLUCOSE, CAPILLARY
Glucose-Capillary: 109 mg/dL — ABNORMAL HIGH (ref 70–99)
Glucose-Capillary: 102 mg/dL — ABNORMAL HIGH (ref 70–99)
Glucose-Capillary: 104 mg/dL — ABNORMAL HIGH (ref 70–99)

## 2020-10-18 LAB — ECHOCARDIOGRAM COMPLETE
Area-P 1/2: 3.08 cm2
Height: 73 in
S' Lateral: 3 cm
Weight: 4240 oz

## 2020-10-18 LAB — EXTRACTABLE NUCLEAR ANTIGEN ANTIBODY
ENA SM Ab Ser-aCnc: <0.2 AI (ref 0.0–0.9)
Ribonucleic Protein: <0.2 AI (ref 0.0–0.9)
SSA (Ro) (ENA) Antibody, IgG: <0.2 AI (ref 0.0–0.9)
SSB (La) (ENA) Antibody, IgG: <0.2 AI (ref 0.0–0.9)
Scleroderma (Scl-70) (ENA) Antibody, IgG: <0.2 AI (ref 0.0–0.9)
ds DNA Ab: 5 IU/mL (ref 0–9)

## 2020-10-18 LAB — C4 COMPLEMENT: Complement C4, Body Fluid: 29 mg/dL (ref 12–38)

## 2020-10-18 LAB — C3 COMPLEMENT: C3 Complement: 146 mg/dL (ref 82–167)

## 2020-10-18 LAB — ANTI-JO 1 ANTIBODY, IGG: Anti JO-1: <0.2 AI (ref 0.0–0.9)

## 2020-10-18 NOTE — Progress Notes (Signed)
Progress Note    Dennis Bis Sr.  GLO:[id number] DOB: May 24, 1956  DOA: 10/17/2020 PCP: Ignacia Palma., MD    Brief Narrative:     Medical records reviewed and are as summarized below:  Dennis Bis Sr. is an 65 y.o. male with medical history significant of HTN; IGT; elevated PSA; TKR 4 months ago; and TIA (02/2020) presenting with neurologic changes.He notices L face and foot tingling and voice changes.   Over the last 1-2 weeks, he has had severe vertigo and was prescribed Meclizine.  Last night, he noticed severe headache that woke him from sleep in his left temple.  Symptoms were similar to prior TIA.  +CVA on MRI  Assessment/Plan:   Principal Problem:   Acute CVA (cerebrovascular accident) (HCC) Active Problems:   Hypertension   Dyslipidemia   BPH (benign prostatic hyperplasia)   Impaired glucose tolerance   Vertigo   Occlusion of left vertebral artery   CVA -Patient presenting with hypophonia, severe vertigo, and L face and body tingling - MRI: New focus of abnormal diffusion restriction within the left dorsal medulla oblongata, consistent with acute to subacute infarct., likely plan for cerebral angiogram on Monday -Echo -Neurology consult -PT/OT -asa/plavix  HTN -Allow permissive HTNfor now -Holdlisinopril and plan to restart after angiogram  HLD -Check FLP - 156/38/80/189 -Continue Crestor but increase to 20 mg daily for goal LDL <70  IGT -A1c is 5.8, indicating good control -Hold home PO Metformin -Will order moderate-scale SSI -Continue Neurontin  BPH -Continue Proscar -Hold tamsulosin for now  obesity Body mass index is 34.96 kg/m.   Family Communication/Anticipated D/C date and plan/Code Status   DVT prophylaxis: Lovenox ordered. Code Status: Full Code.  Disposition Plan: Status is: Inpatient  Remains inpatient appropriate because:IV treatments appropriate due to intensity of illness or inability to take PO and Inpatient level of  care appropriate due to severity of illness   Dispo: The patient is from: Home              Anticipated d/c is to: Home              Patient currently is not medically stable to d/c.   Difficult to place patient No         Medical Consultants:    IR  neurology  Subjective:   Has to keep her left eye covered  Objective:    Vitals:   10/17/20 2157 10/18/20 0000 10/18/20 0200 10/18/20 0415  BP: (!) 144/80 (!) 151/74 140/68 (!) 159/78  Pulse: 98 94 80 76  Resp: 18 18 18 19   Temp: 98.5 F (36.9 C) 98.6 F (37 C) 97.9 F (36.6 C) 99 F (37.2 C)  TempSrc: Oral Oral Oral Oral  SpO2: 94% 96% 95% 99%  Weight:      Height:        Intake/Output Summary (Last 24 hours) at 10/18/2020 1135 Last data filed at 10/18/2020 0400 Gross per 24 hour  Intake 552.5 ml  Output 150 ml  Net 402.5 ml   Filed Weights   10/17/20 1806  Weight: 120.2 kg    Exam:  General: Appearance:    Obese male in no acute distress     Lungs:     respirations unlabored  Heart:    Normal heart rate. Normal rhythm. No murmurs, rubs, or gallops.   MS:   All extremities are intact.   Neurologic:   Awake, alert, oriented x 3. Left eye covered  Data Reviewed:   I have personally reviewed following labs and imaging studies:  Labs: Labs show the following:   Basic Metabolic Panel: Recent Labs  Lab 10/17/20 0218 10/17/20 0227  NA 141 142  K 4.1 4.0  CL 106 108  CO2 26  --   GLUCOSE 105* 107*  BUN 24* 24*  CREATININE 1.17 1.10  CALCIUM 9.4  --    GFR Estimated Creatinine Clearance: 89.7 mL/min (by C-G formula based on SCr of 1.1 mg/dL). Liver Function Tests: Recent Labs  Lab 10/17/20 0218  AST 27  ALT 28  ALKPHOS 75  BILITOT 0.4  PROT 7.0  ALBUMIN 4.2   No results for input(s): LIPASE, AMYLASE in the last 168 hours. No results for input(s): AMMONIA in the last 168 hours. Coagulation profile Recent Labs  Lab 10/17/20 0218  INR 1.0    CBC: Recent Labs  Lab  10/17/20 0218 10/17/20 0227  WBC 7.0  --   NEUTROABS 4.4  --   HGB 13.9 13.9  HCT 43.9 41.0  MCV 88.9  --   PLT 151  --    Cardiac Enzymes: No results for input(s): CKTOTAL, CKMB, CKMBINDEX, TROPONINI in the last 168 hours. BNP (last 3 results) No results for input(s): PROBNP in the last 8760 hours. CBG: Recent Labs  Lab 10/17/20 0218 10/17/20 2116 10/18/20 0606 10/18/20 1104  GLUCAP 112* 148* 109* 102*   D-Dimer: No results for input(s): DDIMER in the last 72 hours. Hgb A1c: Recent Labs    10/17/20 1017  HGBA1C 5.8*   Lipid Profile: Recent Labs    10/17/20 1017  CHOL 156  HDL 38*  LDLCALC 80  TRIG 277*  CHOLHDL 4.1   Thyroid function studies: No results for input(s): TSH, T4TOTAL, T3FREE, THYROIDAB in the last 72 hours.  Invalid input(s): FREET3 Anemia work up: No results for input(s): VITAMINB12, FOLATE, FERRITIN, TIBC, IRON, RETICCTPCT in the last 72 hours. Sepsis Labs: Recent Labs  Lab 10/17/20 0218  WBC 7.0    Microbiology Recent Results (from the past 240 hour(s))  SARS CORONAVIRUS 2 (TAT 6-24 HRS) Nasopharyngeal Nasopharyngeal Swab     Status: None   Collection Time: 10/17/20  9:13 AM   Specimen: Nasopharyngeal Swab  Result Value Ref Range Status   SARS Coronavirus 2 NEGATIVE NEGATIVE Final    Comment: (NOTE) SARS-CoV-2 target nucleic acids are NOT DETECTED.  The SARS-CoV-2 RNA is generally detectable in upper and lower respiratory specimens during the acute phase of infection. Negative results do not preclude SARS-CoV-2 infection, do not rule out co-infections with other pathogens, and should not be used as the sole basis for treatment or other patient management decisions. Negative results must be combined with clinical observations, patient history, and epidemiological information. The expected result is Negative.  Fact Sheet for Patients: HairSlick.no  Fact Sheet for Healthcare  Providers: quierodirigir.com  This test is not yet approved or cleared by the [place] FDA and  has been authorized for detection and/or diagnosis of SARS-CoV-2 by FDA under an Emergency Use Authorization (EUA). This EUA will remain  in effect (meaning this test can be used) for the duration of the COVID-19 declaration under Se ction 564(b)(1) of the Act, 21 U.S.C. section 360bbb-3(b)(1), unless the authorization is terminated or revoked sooner.  Performed at [facility], [address], [place], [place] [address]     Procedures and diagnostic studies:  MR BRAIN WO CONTRAST  Result Date: 10/18/2020 CLINICAL DATA:  Stroke follow-up EXAM:  MRI HEAD WITHOUT CONTRAST TECHNIQUE: Multiplanar, multiecho pulse sequences of the brain and surrounding structures were obtained without intravenous contrast. COMPARISON:  10/17/2020 brain MRI FINDINGS: Brain: There is a new focus of abnormal diffusion restriction within the left dorsal medulla oblongata (5:56). No other diffusion abnormality. No acute hemorrhage. No mass effect. There is multifocal hyperintense T2-weighted signal within the white matter. Parenchymal volume and CSF spaces are normal. The midline structures are normal. Vascular: Major flow voids are preserved. Skull and upper cervical spine: Normal calvarium and skull base. Visualized upper cervical spine and soft tissues are normal. Sinuses/Orbits:No paranasal sinus fluid levels or advanced mucosal thickening. No mastoid or middle ear effusion. Normal orbits. IMPRESSION: New focus of abnormal diffusion restriction within the left dorsal medulla oblongata, consistent with acute to subacute infarct. Electronically Signed   By: Deatra Robinson M.D.   On: 10/18/2020 01:01   MR BRAIN WO CONTRAST  Result Date: 10/17/2020 CLINICAL DATA:  Initial evaluation for acute stroke. EXAM: MRI HEAD WITHOUT CONTRAST TECHNIQUE: Multiplanar, multiecho pulse sequences of the brain  and surrounding structures were obtained without intravenous contrast. COMPARISON:  Prior studies from earlier the same day as well as previous MRI from 03/05/2020. FINDINGS: Brain: Examination degraded by motion artifact. Generalized age-related cerebral atrophy. Patchy T2/FLAIR hyperintensity within the periventricular and deep white matter both cerebral hemispheres most consistent with chronic small vessel ischemic disease, moderate in nature. Superimposed remote lacunar infarct present at the right thalamus. No abnormal foci of restricted diffusion to suggest acute or subacute ischemia. Gray-white matter differentiation maintained. No encephalomalacia to suggest chronic cortical infarction. No evidence for acute or chronic intracranial hemorrhage. No mass lesion, midline shift or mass effect. No hydrocephalus or extra-axial fluid collection. Pituitary gland and suprasellar region within normal limits. Midline structures intact. Vascular: Absent flow void within the left V4 segment, consistent with previously identified occlusion. Major intracranial vascular flow voids are otherwise maintained. Skull and upper cervical spine: Craniocervical junction within normal limits. Upper cervical spine normal. Bone marrow signal intensity within normal limits. No focal marrow replacing lesion. No scalp soft tissue abnormality. Sinuses/Orbits: Globes orbital soft tissues demonstrate no acute finding. Mild scattered mucosal thickening noted within the ethmoidal air cells and maxillary sinuses. Paranasal sinuses are otherwise clear. No mastoid effusion. Inner ear structures grossly normal. Other: None. IMPRESSION: 1. No acute intracranial abnormality. 2. 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 Overall, appearance is similar as compared to previous MRI from 03/05/2020. Electronically Signed   By: Rise Mu M.D.    On: 10/17/2020 04:23   CT HEAD CODE STROKE WO CONTRAST  Result Date: 10/17/2020 CLINICAL DATA:  Code stroke. Initial evaluation for neuro deficit, stroke suspected. EXAM: CT HEAD WITHOUT CONTRAST TECHNIQUE: Contiguous axial images were obtained from the base of the skull through the vertex without intravenous contrast. COMPARISON:  None. FINDINGS: Brain: Age-related cerebral atrophy with moderate chronic microvascular ischemic disease. Small remote lacunar infarct at the right thalamus. No acute intracranial hemorrhage. No acute large vessel territory infarct. No mass lesion, midline shift or mass effect. No hydrocephalus or extra-axial fluid collection. Vascular: No hyperdense vessel. Skull: Scalp soft tissues within normal limits.  Calvarium intact. Sinuses/Orbits: Globes and orbital soft tissues demonstrate no acute finding. Paranasal sinuses are largely clear. No mastoid effusion. Other: None. ASPECTS [facility] Stroke Program Early CT Score) - Ganglionic level infarction (caudate, lentiform nuclei, internal capsule, insula, M1-M3 cortex): 7 - Supraganglionic infarction (M4-M6 cortex):  3 Total score (0-10 with 10 being normal): 10 IMPRESSION: 1. No acute intracranial infarct or other abnormality. 2. ASPECTS is 10. 3. Age-related cerebral atrophy with chronic small vessel ischemic disease. These results were communicated to Dr. Wilford CornerArora at 2:35 amon 5/6/2022by text page via the [facility] messaging system. Electronically Signed   By: Rise MuBenjamin  McClintock M.D.   On: 10/17/2020 02:36   CT ANGIO HEAD NECK W WO CM W PERF (CODE STROKE)  Result Date: 10/17/2020 CLINICAL DATA:  Follow-up examination for acute stroke. EXAM: CT ANGIOGRAPHY HEAD AND NECK CT PERFUSION BRAIN TECHNIQUE: Multidetector CT imaging of the head and neck was performed using the standard protocol during bolus administration of intravenous contrast. Multiplanar CT image reconstructions and MIPs were obtained to evaluate the vascular anatomy. Carotid  stenosis measurements (when applicable) are obtained utilizing NASCET criteria, using the distal internal carotid diameter as the denominator. Multiphase CT imaging of the brain was performed following IV bolus contrast injection. Subsequent parametric perfusion maps were calculated using RAPID software. CONTRAST:  75mL OMNIPAQUE IOHEXOL 350 MG/ML SOLN COMPARISON:  Prior head CT from earlier the same day. FINDINGS: CTA NECK FINDINGS Aortic arch: Examination technically limited by motion and timing of the contrast bolus. Visualized aortic arch normal caliber with normal branch pattern. No stenosis about the origin of the great vessels. Right carotid system: Right CCA patent from its origin to the bifurcation. Mild mixed plaque about the right bifurcation without significant stenosis. Right ICA patent distally without stenosis, dissection or occlusion. Left carotid system: Left CCA patent from its origin to the bifurcation without stenosis. Mild mixed plaque about the left bifurcation without significant stenosis. Left ICA widely patent distally without stenosis, dissection or occlusion. Vertebral arteries: Both vertebral arteries arise from subclavian arteries. Right vertebral artery dominant. Origin and proximal left vertebral artery not well evaluated due to adjacent venous contamination. Visualized portions of the vertebral arteries patent within the neck without appreciable stenosis or other acute vascular abnormality. Skeleton: No acute osseous abnormality. No discrete or worrisome osseous lesions. Other neck: No other visible acute soft tissue abnormality within the neck. No mass or adenopathy. Upper chest: Visualized upper chest demonstrates no acute finding. Review of the MIP images confirms the above findings CTA HEAD FINDINGS Anterior circulation: Focal severe stenosis involving the horizontal petrous right ICA (series 7, image 127). Petrous left ICA widely patent. Additional scattered plaque within the  carotid siphons with no more than mild stenosis elsewhere. A1 segments patent bilaterally. Normal anterior communicating artery complex. Anterior cerebral arteries patent to their distal aspects without appreciable stenosis. No M1 stenosis or occlusion. Normal MCA bifurcations. Distal MCA branches well perfused. Posterior circulation: Dominant right V4 segment patent to the vertebrobasilar junction without stenosis. Right PICA patent. Left vertebral artery largely occludes just as it courses into the cranial vault. Left PICA is perfused at the skull base. Basilar patent to its distal aspect without stenosis. Superior cerebellar arteries patent bilaterally. Both PCAs primarily supplied via the basilar. Multifocal atheromatous irregularity seen throughout the PCAs bilaterally. Associated multifocal severe left P2 stenoses (series 10, image 21). PCAs otherwise perfused to their distal aspects without proximal stenosis. Venous sinuses: Grossly patent allowing for timing the contrast bolus. Anatomic variants: None significant. No visible aneurysm. Review of the MIP images confirms the above findings CT Brain Perfusion Findings: ASPECTS: 10. CBF (<30%) Volume: 0mL Perfusion (Tmax>6.0s) volume: 7mL Mismatch Volume: 7mL Infarction Location:Negative CT perfusion for acute ischemia. Delayed perfusion seen within the left cerebellar hemisphere related to the occluded  left vertebral artery. IMPRESSION: CTA HEAD AND NECK IMPRESSION: 1. Negative CTA for emergent large vessel occlusion. 2. 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 3. Multifocal severe left P2 stenoses. 4. 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 Dominant right vertebral artery widely patent. 5. Mild atheromatous disease about the major arterial vasculature within the neck. No flow-limiting stenosis within the neck. CT PERFUSION IMPRESSION: 1. Negative CT perfusion for acute ischemia. 2. 7 cc perfusion  deficit within the left cerebellar hemisphere, in keeping with the occluded left V4 segment. Electronically Signed   By: Rise Mu M.D.   On: 10/17/2020 03:57    Medications:   .  stroke: mapping our early stages of recovery book   Does not apply Once  . acetaminophen  500 mg Oral BID  . aspirin  325 mg Oral Daily  . clopidogrel  75 mg Oral Daily  . enoxaparin (LOVENOX) injection  40 mg Subcutaneous Q24H  . finasteride  5 mg Oral Daily  . gabapentin  300 mg Oral Daily  . insulin aspart  0-9 Units Subcutaneous TID WC  . rosuvastatin  40 mg Oral QHS   Continuous Infusions: . sodium chloride 50 mL/hr at 10/17/20 1800     LOS: 1 day   Joseph Art  [facility]   How to contact the [facility] Attending or Consulting provider 7A - 7P or covering provider during after hours 7P -7A, for this patient?  1. Check the care team in [facility] and look for a) attending/consulting [facility] provider listed and b) the [facility] team listed 2. Log into [email or web address] and use [facility]'s universal password to access. If you do not have the password, please contact the hospital operator. 3. Locate the [facility] provider you are looking for under [facility] and page to a number that you can be directly reached. 4. If you still have difficulty reaching the provider, please page the [facility] (Director on Call) for the Hospitalists listed on amion for assistance.  10/18/2020, 11:35 AM

## 2020-10-18 NOTE — Progress Notes (Signed)
STROKE TEAM PROGRESS NOTE   SUBJECTIVE (INTERVAL HISTORY)  In 02/2020 pt had 5 sneezes, after that he had vertigo, room spinning, by the time he arrived to [facility] ED, symptoms resolved. MRI was neg but his CTA head and neck showed left VA distal occlusion vs. High grade stenosis. This time, he again sneezed hard twice and he had severe pain at left temporal region, associated with vertigo, difficulty swallowing, hoarseness, difficulty walking. The HA lasted about 15-20 min resovled, but vertigo continued, motion makes it worse. In ER, he also stated intermittent hiccup. On exam, he had left UE ataxia and upper beating nystagmus on bilateral gaze, more prominent with left gaze. His CTA head and neck again showed left V4 occlusion and seems smaller V3 comparing with before. 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 plan for cerebral angio on Monday. His ESR and CRP normal, low suspicious for vasculitis at this time. 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   OBJECTIVE Temp:  [97.8 F (36.6 C)-99 F (37.2 C)] 99 F (37.2 C) (05/07 0415) Pulse Rate:  [56-98] 76 (05/07 0415) Cardiac Rhythm: Normal sinus rhythm (05/07 0700) Resp:  [8-19] 19 (05/07 0415) BP: (140-192)/(68-106) 159/78 (05/07 0415) SpO2:  [94 %-99 %] 99 % (05/07 0415) Weight:  [120.2 kg] 120.2 kg (05/06 1806)  Recent Labs  Lab 10/17/20 0218 10/17/20 2116 10/18/20 0606  GLUCAP 112* 148* 109*   Recent Labs  Lab 10/17/20 0218 10/17/20 0227  NA 141 142  K 4.1 4.0  CL 106 108  CO2 26  --   GLUCOSE 105* 107*  BUN 24* 24*  CREATININE 1.17 1.10  CALCIUM 9.4  --    Recent Labs  Lab 10/17/20 0218  AST 27  ALT 28  ALKPHOS 75  BILITOT 0.4  PROT 7.0  ALBUMIN 4.2   Recent Labs  Lab 10/17/20 0218 10/17/20 0227  WBC 7.0  --   NEUTROABS 4.4  --   HGB 13.9 13.9  HCT 43.9 41.0  MCV 88.9  --   PLT 151  --    No results for input(s): CKTOTAL, CKMB, CKMBINDEX, TROPONINI in  the last 168 hours. Recent Labs    10/17/20 0218  LABPROT 13.5  INR 1.0   No results for input(s): COLORURINE, LABSPEC, PHURINE, GLUCOSEU, HGBUR, BILIRUBINUR, KETONESUR, PROTEINUR, UROBILINOGEN, NITRITE, LEUKOCYTESUR in the last 72 hours.  Invalid input(s): APPERANCEUR     Component Value Date/Time   CHOL 156 10/17/2020 1017   TRIG 189 (H) 10/17/2020 1017   HDL 38 (L) 10/17/2020 1017   CHOLHDL 4.1 10/17/2020 1017   VLDL 38 10/17/2020 1017   LDLCALC 80 10/17/2020 1017   Lab Results  Component Value Date   HGBA1C 5.8 (H) 10/17/2020   No results found for: LABOPIA, COCAINSCRNUR, LABBENZ, AMPHETMU, THCU, LABBARB  No results for input(s): ETH in the last 168 hours.  I have personally reviewed the radiological images below and agree with the radiology interpretations.  MR BRAIN WO CONTRAST  Result Date: 10/18/2020 CLINICAL DATA:  Stroke follow-up EXAM: MRI HEAD WITHOUT CONTRAST TECHNIQUE: Multiplanar, multiecho pulse sequences of the brain and surrounding structures were obtained without intravenous contrast. COMPARISON:  10/17/2020 brain MRI FINDINGS: Brain: There is a new focus of abnormal diffusion restriction within the left dorsal medulla oblongata (5:56). No other diffusion abnormality. No acute hemorrhage. No mass effect. There is multifocal hyperintense T2-weighted signal within the white matter. Parenchymal volume and CSF spaces are normal. The midline  structures are normal. Vascular: Major flow voids are preserved. Skull and upper cervical spine: Normal calvarium and skull base. Visualized upper cervical spine and soft tissues are normal. Sinuses/Orbits:No paranasal sinus fluid levels or advanced mucosal thickening. No mastoid or middle ear effusion. Normal orbits. IMPRESSION: New focus of abnormal diffusion restriction within the left dorsal medulla oblongata, consistent with acute to subacute infarct. Electronically Signed   By: Ulyses Jarred M.D.   On: 10/18/2020 01:01   MR  BRAIN WO CONTRAST  Result Date: 10/17/2020 CLINICAL DATA:  Initial evaluation for acute stroke. EXAM: MRI HEAD WITHOUT CONTRAST TECHNIQUE: Multiplanar, multiecho pulse sequences of the brain and surrounding structures were obtained without intravenous contrast. COMPARISON:  Prior studies from earlier the same day as well as previous MRI from 03/05/2020. FINDINGS: Brain: Examination degraded by motion artifact. Generalized age-related cerebral atrophy. Patchy T2/FLAIR hyperintensity within the periventricular and deep white matter both cerebral hemispheres most consistent with chronic small vessel ischemic disease, moderate in nature. Superimposed remote lacunar infarct present at the right thalamus. No abnormal foci of restricted diffusion to suggest acute or subacute ischemia. Gray-white matter differentiation maintained. No encephalomalacia to suggest chronic cortical infarction. No evidence for acute or chronic intracranial hemorrhage. No mass lesion, midline shift or mass effect. No hydrocephalus or extra-axial fluid collection. Pituitary gland and suprasellar region within normal limits. Midline structures intact. Vascular: Absent flow void within the left V4 segment, consistent with previously identified occlusion. Major intracranial vascular flow voids are otherwise maintained. Skull and upper cervical spine: Craniocervical junction within normal limits. Upper cervical spine normal. Bone marrow signal intensity within normal limits. No focal marrow replacing lesion. No scalp soft tissue abnormality. Sinuses/Orbits: Globes orbital soft tissues demonstrate no acute finding. Mild scattered mucosal thickening noted within the ethmoidal air cells and maxillary sinuses. Paranasal sinuses are otherwise clear. No mastoid effusion. Inner ear structures grossly normal. Other: None. IMPRESSION: 1. No acute intracranial abnormality. 2. Age-related cerebral atrophy with moderate chronic small vessel ischemic disease,  with superimposed remote lacunar infarct at the right thalamus. Overall, appearance is similar as compared to previous MRI from 03/05/2020. Electronically Signed   By: Jeannine Boga M.D.   On: 10/17/2020 04:23   CT HEAD CODE STROKE WO CONTRAST  Result Date: 10/17/2020 CLINICAL DATA:  Code stroke. Initial evaluation for neuro deficit, stroke suspected. EXAM: CT HEAD WITHOUT CONTRAST TECHNIQUE: Contiguous axial images were obtained from the base of the skull through the vertex without intravenous contrast. COMPARISON:  None. FINDINGS: Brain: Age-related cerebral atrophy with moderate chronic microvascular ischemic disease. Small remote lacunar infarct at the right thalamus. No acute intracranial hemorrhage. No acute large vessel territory infarct. No mass lesion, midline shift or mass effect. No hydrocephalus or extra-axial fluid collection. Vascular: No hyperdense vessel. Skull: Scalp soft tissues within normal limits.  Calvarium intact. Sinuses/Orbits: Globes and orbital soft tissues demonstrate no acute finding. Paranasal sinuses are largely clear. No mastoid effusion. Other: None. ASPECTS [facility] Stroke Program Early CT Score) - Ganglionic level infarction (caudate, lentiform nuclei, internal capsule, insula, M1-M3 cortex): 7 - Supraganglionic infarction (M4-M6 cortex): 3 Total score (0-10 with 10 being normal): 10 IMPRESSION: 1. No acute intracranial infarct or other abnormality. 2. ASPECTS is 10. 3. Age-related cerebral atrophy with chronic small vessel ischemic disease. These results were communicated to Dr. Rory Percy at 2:35 amon 5/6/2022by text page via the [facility] messaging system. Electronically Signed   By: Jeannine Boga M.D.   On: 10/17/2020 02:36   CT ANGIO HEAD NECK W WO  CM W PERF (CODE STROKE)  Result Date: 10/17/2020 CLINICAL DATA:  Follow-up examination for acute stroke. EXAM: CT ANGIOGRAPHY HEAD AND NECK CT PERFUSION BRAIN TECHNIQUE: Multidetector CT imaging of the head and neck was  performed using the standard protocol during bolus administration of intravenous contrast. Multiplanar CT image reconstructions and MIPs were obtained to evaluate the vascular anatomy. Carotid stenosis measurements (when applicable) are obtained utilizing NASCET criteria, using the distal internal carotid diameter as the denominator. Multiphase CT imaging of the brain was performed following IV bolus contrast injection. Subsequent parametric perfusion maps were calculated using RAPID software. CONTRAST:  48m OMNIPAQUE IOHEXOL 350 MG/ML SOLN COMPARISON:  Prior head CT from earlier the same day. FINDINGS: CTA NECK FINDINGS Aortic arch: Examination technically limited by motion and timing of the contrast bolus. Visualized aortic arch normal caliber with normal branch pattern. No stenosis about the origin of the great vessels. Right carotid system: Right CCA patent from its origin to the bifurcation. Mild mixed plaque about the right bifurcation without significant stenosis. Right ICA patent distally without stenosis, dissection or occlusion. Left carotid system: Left CCA patent from its origin to the bifurcation without stenosis. Mild mixed plaque about the left bifurcation without significant stenosis. Left ICA widely patent distally without stenosis, dissection or occlusion. Vertebral arteries: Both vertebral arteries arise from subclavian arteries. Right vertebral artery dominant. Origin and proximal left vertebral artery not well evaluated due to adjacent venous contamination. Visualized portions of the vertebral arteries patent within the neck without appreciable stenosis or other acute vascular abnormality. Skeleton: No acute osseous abnormality. No discrete or worrisome osseous lesions. Other neck: No other visible acute soft tissue abnormality within the neck. No mass or adenopathy. Upper chest: Visualized upper chest demonstrates no acute finding. Review of the MIP images confirms the above findings CTA HEAD  FINDINGS Anterior circulation: Focal severe stenosis involving the horizontal petrous right ICA (series 7, image 127). Petrous left ICA widely patent. Additional scattered plaque within the carotid siphons with no more than mild stenosis elsewhere. A1 segments patent bilaterally. Normal anterior communicating artery complex. Anterior cerebral arteries patent to their distal aspects without appreciable stenosis. No M1 stenosis or occlusion. Normal MCA bifurcations. Distal MCA branches well perfused. Posterior circulation: Dominant right V4 segment patent to the vertebrobasilar junction without stenosis. Right PICA patent. Left vertebral artery largely occludes just as it courses into the cranial vault. Left PICA is perfused at the skull base. Basilar patent to its distal aspect without stenosis. Superior cerebellar arteries patent bilaterally. Both PCAs primarily supplied via the basilar. Multifocal atheromatous irregularity seen throughout the PCAs bilaterally. Associated multifocal severe left P2 stenoses (series 10, image 21). PCAs otherwise perfused to their distal aspects without proximal stenosis. Venous sinuses: Grossly patent allowing for timing the contrast bolus. Anatomic variants: None significant. No visible aneurysm. Review of the MIP images confirms the above findings CT Brain Perfusion Findings: ASPECTS: 10. CBF (<30%) Volume: 079mPerfusion (Tmax>6.0s) volume: 79m18mismatch Volume: 79mL6mfarction Location:Negative CT perfusion for acute ischemia. Delayed perfusion seen within the left cerebellar hemisphere related to the occluded left vertebral artery. IMPRESSION: CTA HEAD AND NECK IMPRESSION: 1. Negative CTA for emergent large vessel occlusion. 2. 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 3. Multifocal severe left P2 stenoses. 4. 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 Dominant right vertebral artery widely patent. 5. Mild atheromatous disease  about the major arterial vasculature within the neck. No flow-limiting stenosis within the  neck. CT PERFUSION IMPRESSION: 1. Negative CT perfusion for acute ischemia. 2. 7 cc perfusion deficit within the left cerebellar hemisphere, in keeping with the occluded left V4 segment. Electronically Signed   By: Jeannine Boga M.D.   On: 10/17/2020 03:57    PHYSICAL EXAM  Temp:  [97.8 F (36.6 C)-99 F (37.2 C)] 99 F (37.2 C) (05/07 0415) Pulse Rate:  [56-98] 76 (05/07 0415) Resp:  [8-19] 19 (05/07 0415) BP: (140-192)/(68-106) 159/78 (05/07 0415) SpO2:  [94 %-99 %] 99 % (05/07 0415) Weight:  [120.2 kg] 120.2 kg (05/06 1806)  General - Well nourished, well developed, in acute distress due to vertigo.  Ophthalmologic - fundi not visualized due to noncooperation.  Cardiovascular - Regular rhythm and rate.  Mental Status -  Level of arousal and orientation to time, place, and person were intact. Language including expression, naming, repetition, comprehension was assessed and found intact. Fund of Knowledge was assessed and was intact.  Cranial Nerves II - XII - II - Visual field intact OU. III, IV, VI - Extraocular movements intact. V - Facial light touch sensation intact bilaterally, but feeling left facial tingling. VII - Facial movement intact bilaterally. VIII - Hearing intact bilaterally. Upbeat nystagmus on bilateral horizontal gaze, more prominent on the left gaze X - Palate elevates symmetrically. Pt does have hoarseness. And complaining of swallow difficulty and intermittent hiccup XI - Chin turning & shoulder shrug intact bilaterally. XII - Tongue protrusion intact.  Motor Strength - The patient's strength was normal in all extremities and pronator drift was absent.  Bulk was normal and fasciculations were absent.   Motor Tone - Muscle tone was assessed at the neck and appendages and was normal.  Reflexes - The patient's reflexes were symmetrical in all extremities and he  had no pathological reflexes.  Sensory - Light touch, temperature/pinprick were assessed and were symmetrical.    Coordination - The patient had normal movements in the right hand and leg with no ataxia or dysmetria. However, left FTN ataxia and HTS mild dysmetria Tremor was absent.  Gait and Station - deferred.   ASSESSMENT/PLAN Mr. Dennis Forero Sr. is a 65 y.o. male with history of hypertension, hyperlipidemia, TIA 02/2020 admitted for transient left temporal headache, vertigo, swallowing difficulty, hoarseness, left-sided numbness and discoordination after 2 bad sneezes. No tPA given due to mild symptoms.    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  MRI 10/17/20  no acute infarct.  MR repeat 10/18/20 shows new DWI restriction with left dorsal medulla oblongata.   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  Focal severe stenosis right petrous ICA, tandem stenosis severe left P2    Plan for cerebral angiogram next Monday 10/21/20  LDL 80  HgbA1c 5.8  ESR 5 and CRP 0.6  Hypercoagulable and autoimmune labs pending  Lovenox for VTE prophylaxis  aspirin 81 mg daily prior to admission, now on aspirin 325 mg daily and clopidogrel 75 mg daily.  Continue DAPT for 3 months and then Plavix alone given intracranial stenosis/occlusion.  Patient counseled to be compliant with his antithrombotic medications  Ongoing aggressive stroke risk factor management  Therapy recommendations: Pending  Disposition: Pending  History of TIA  02/2020 admitted to [facility] for similar episode with difficulty walking, left-sided  numbness tingling, dizziness after 5 episode of sneeze.  CTA head and neck was reported normal however, on further  review, patient ready had right petrous ICA severe stenosis, left V4 occlusion.  Left P2 stenosis present however not reported likely due to venous contamination.  MRI no acute infarct.  2D echo unremarkable.  Patient symptoms resolved in ED at the time.  Patient discharged with DAPT and Lipitor 80.  Now we think it back, concerning that episode likely due to left VA dissection / occlusion after sneeze  Hypertension . Stable on the high end . Permissive hypertension (OK if <220/120) for 24-48 hours post stroke and then gradually reach goal within 5-7 days.  Long term BP goal 130-150 given multifocal intracranial severe stenosis.  Hyperlipidemia  Home meds: Crestor 20  LDL 80, goal < 70  Now on Crestor 40  Continue statin at discharge  Other Stroke Risk Factors  Advanced age  Obesity, Body mass index is 34.96 kg/m.   Other [facility] day # 1      To contact Stroke Continuity provider, please refer to [email or web address] After hours, contact General Neurology

## 2020-10-18 NOTE — Progress Notes (Signed)
Inpatient Rehab Admissions Coordinator Note:   Per PT/OT recommendations, pt was screened for CIR candidacy by Wolfgang Phoenix, MS, CCC-SLP.  At this time we are recommending an inpatient rehab consult.  AC will place consult order per protocol.  Please contact me with questions.    Wolfgang Phoenix, MS, CCC-SLP Admissions Coordinator [phone] 10/18/20 5:22 PM

## 2020-10-18 NOTE — Progress Notes (Signed)
  Echocardiogram 2D Echocardiogram has been performed.  Dennis Strickland 10/18/2020, 4:34 PM

## 2020-10-18 NOTE — Evaluation (Signed)
Physical Therapy Evaluation Patient Details Name: Dennis Fiumara Sr. MRN: [id number] DOB: 10-Jul-1955 Today's Date: 10/18/2020   History of Present Illness  Pt is a 65 y.o. M who presents with hypophonia, severe vertigo, and L face/body tingling. 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 CTA showing left V4 occlusion. Plan for cerebral angiogram on Monday. Significant PMH: R TKA 4 months ago, HTN, TIA (02/2020).  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 Pt presents with decreased functional mobility secondary to visual deficits (L eye diplopia), poor sitting and standing balance. Pt with improvement in diplopia with left eye occluded; he denies nausea currently. Pt displays left lateral lean in sitting and requires min assist to stand; unable to take steps. BP pre stand 158/105, 157/80 post stand. No focal strength deficits noted, dysmetria bilaterally with finger to nose test, likely secondary to vision. Recommend CIR to address deficits and maximize functional independence; suspect excellent progress given age, PLOF, and motivation.     Follow Up Recommendations CIR    Equipment Recommendations  Other (comment) (TBA)    Recommendations for Other Services       Precautions / Restrictions Precautions Precautions: Fall;Other (comment) Precaution Comments: Diplopia Restrictions Weight Bearing Restrictions: No      Mobility  Bed Mobility Overal bed mobility: Needs Assistance Bed Mobility: Supine to Sit;Sit to Supine     Supine to sit: Supervision Sit to supine: Min guard   General bed mobility comments: Supervision-min guard for supine <> sit, HOB elevated, use of bed rail    Transfers Overall transfer level: Needs assistance Equipment used: None Transfers: Sit to/from Stand Sit to Stand: Min assist          General transfer comment: MinA to rise and steady x 2, pt holding onto L bed rail, utilizing wide BOS for comfort.  Ambulation/Gait                Stairs            Wheelchair Mobility    Modified Rankin (Stroke Patients Only) Modified Rankin (Stroke Patients Only) Pre-Morbid Rankin Score: No symptoms Modified Rankin: Severe disability     Balance Overall balance assessment: Needs assistance Sitting-balance support: Feet supported Sitting balance-Leahy Scale: Poor Sitting balance - Comments: Pt with frequent left lateral lean, able to correct with self cueing vs PT verbal cueing   Standing balance support: Single extremity supported;During functional activity Standing balance-Leahy Scale: Poor Standing balance comment: reliant on single UE support, wide BOS                             Pertinent Vitals/Pain Pain Assessment: No/denies pain    Home Living Family/patient expects to be discharged to:: Private residence Living Arrangements: Children;Spouse/significant other (wife, daughter, grandson) Available Help at Discharge: Family;Available 24 hours/day Type of Home: House Home Access: Stairs to enter   [employer] of Steps: 1 (from carport) Home Layout: Multi-level Home Equipment: Bedside commode;Shower seat - built in;Walker - 2 wheels;Cane - single point      Prior Function Level of Independence: Independent         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     Hand Dominance  Extremity/Trunk Assessment   Upper Extremity Assessment Upper Extremity Assessment: Defer to OT evaluation    Lower Extremity Assessment Lower Extremity Assessment: RLE deficits/detail;LLE deficits/detail RLE Deficits / Details: Strength 5/5 LLE Deficits / Details: Strength 5/5    Cervical / Trunk Assessment Cervical / Trunk  Assessment: Normal  Communication   Communication: Other (comment) (hypophonia (acute))  Cognition Arousal/Alertness: Awake/alert Behavior During Therapy: WFL for tasks assessed/performed Overall Cognitive Status: Within Functional Limits for tasks assessed                                 General Comments: Descriptive and aware of deficits      General Comments      Exercises     Assessment/Plan    PT Assessment Patient needs continued PT services  PT Problem List Decreased activity tolerance;Decreased mobility;Decreased balance;Decreased coordination       PT Treatment Interventions DME instruction;Gait training;Stair training;Functional mobility training;Therapeutic activities;Therapeutic exercise;Balance training;Patient/family education    PT Goals (Current goals can be found in the Care Plan section)  Acute Rehab PT Goals Patient Stated Goal: back to baseline PT Goal Formulation: With patient Time For Goal Achievement: 11/01/20 Potential to Achieve Goals: Good    Frequency Min 4X/week   Barriers to discharge        Co-evaluation               AM-PAC PT "6 Clicks" Mobility  Outcome Measure Help needed turning from your back to your side while in a flat bed without using bedrails?: None Help needed moving from lying on your back to sitting on the side of a flat bed without using bedrails?: A Little Help needed moving to and from a bed to a chair (including a wheelchair)?: A Little Help needed standing up from a chair using your arms (e.g., wheelchair or bedside chair)?: A Little Help needed to walk in hospital room?: A Lot Help needed climbing 3-5 steps with a railing? : A Lot 6 Click Score: 17    End of Session Equipment Utilized During Treatment: Gait belt Activity Tolerance: Other (comment) (limited by diplopia, dizziness) Patient left: in bed;with call bell/phone within reach;with bed alarm set Nurse Communication: Mobility status PT  Visit Diagnosis: Unsteadiness on feet (R26.81);Difficulty in walking, not elsewhere classified (R26.2);Other symptoms and signs involving the nervous system (R29.898)    Time: 2202-5427 PT Time Calculation (min) (ACUTE ONLY): 32 min   Charges:   PT Evaluation $PT Eval Moderate Complexity: 1 Mod PT Treatments $Therapeutic Activity: 8-22 mins        Lillia Pauls, PT, DPT Acute Rehabilitation Services Pager [phone] Office [phone]   Norval Morton 10/18/2020, 12:41 PM

## 2020-10-18 NOTE — Evaluation (Signed)
Occupational Therapy Evaluation Patient Details Name: Dennis Strickland. MRN: [id number] DOB: 19-Jan-1956 Today's Date: 10/18/2020    History of Present Illness Pt is a 65 y.o. M who presents with hypophonia, severe vertigo, and L face/body tingling. 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 CTA showing left V4 occlusion. Plan for cerebral angiogram on Monday. Significant PMH: R TKA 4 months ago, HTN, TIA (02/2020).   Clinical Impression   Pt admitted with above. He demonstrates the below listed deficits and will benefit from continued OT to maximize safety and independence with BADLs.  Pt presents to OT with significant balance deficits, ataxia, visual deficits including diplopia and nystagmus, and decreased activity tolerance.  He currently requires set up - max A for ADLs.  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  Feel he will benefit from CIR level rehab at discharge.       Follow Up Recommendations  CIR    Equipment Recommendations  None recommended by OT    Recommendations for Other Services Rehab consult     Precautions / Restrictions Precautions Precautions: Fall;Other (comment) Precaution Comments: Diplopia      Mobility Bed Mobility Overal bed mobility: Needs Assistance Bed Mobility: Supine to Sit;Sit to Supine     Supine to sit: Min assist Sit to supine: Min assist   General bed mobility comments: assist for balance upon moving to EOB and assist to control descent when moving shoulders back toward the bed    Transfers                      Balance Overall balance assessment: Needs assistance Sitting-balance support: Feet supported Sitting balance-Leahy Scale: Poor Sitting balance - Comments: Pt requires min guard - max a for sitting balance with heavy lt lateral lean Postural  control: Left lateral lean                                 ADL either performed or assessed with clinical judgement   ADL Overall ADL's : Needs assistance/impaired Eating/Feeding: Modified independent;Bed level   Grooming: Wash/dry hands;Wash/dry face;Oral care;Moderate assistance;Sitting Grooming Details (indicate cue type and reason): mod A for sitting balance EOB Upper Body Bathing: Moderate assistance;Sitting Upper Body Bathing Details (indicate cue type and reason): mod A for sitting balance EOB Lower Body Bathing: Maximal assistance;Bed level;Sitting/lateral leans   Upper Body Dressing : Sitting;Maximal assistance Upper Body Dressing Details (indicate cue type and reason): max A for sitting balance EOB Lower Body Dressing: Maximal assistance;Sitting/lateral leans;Bed level Lower Body Dressing Details (indicate cue type and reason): able to doff Rt sock with mod A for balance, but unable to don it due to significant Lt lateral lean Toilet Transfer: Total assistance Toilet Transfer Details (indicate cue type and reason): unable Toileting- Clothing Manipulation and Hygiene: Total assistance;Sitting/lateral lean;Bed level       Functional mobility during ADLs: Maximal assistance;Moderate assistance (bed mobility)       Vision Baseline Vision/History: No visual deficits Patient Visual Report: Diplopia Vision Assessment?: Yes Eye Alignment: Impaired (comment) Ocular Range of Motion: Restricted on the right (Rt eye with difficulty sustaining lateral gaze) Tracking/Visual Pursuits: Decreased smoothness of vertical tracking;Decreased smoothness of horizontal tracking;Decreased smoothness of eye movement to RIGHT superior field;Decreased smoothness of eye movement to  LEFT superior field;Decreased smoothness of eye movement to RIGHT inferior field;Decreased smoothness of eye movement to LEFT inferior field Saccades: Impaired - to be further tested in functional  context Convergence: Impaired (comment) Diplopia Assessment: Disappears with one eye closed;Objects split on top of one another;Present in near gaze;Present in far gaze;Present all the time/all directions Depth Perception: Overshoots;Undershoots Additional Comments: rotational nystagmus noted bil. eyes     Perception Perception Perception Tested?: Yes   Praxis Praxis Praxis tested?: Within functional limits    Pertinent Vitals/Pain Pain Assessment: No/denies pain     Hand Dominance     Extremity/Trunk Assessment Upper Extremity Assessment Upper Extremity Assessment: LUE deficits/detail;RUE deficits/detail RUE Deficits / Details: very mild dysmetria RUE Coordination: decreased fine motor;decreased gross motor LUE Deficits / Details: ataxia noted LUE Sensation: decreased proprioception;decreased light touch LUE Coordination: decreased fine motor;decreased gross motor   Lower Extremity Assessment Lower Extremity Assessment: Defer to PT evaluation   Cervical / Trunk Assessment Cervical / Trunk Assessment: Normal   Communication Communication Communication: Expressive difficulties (impaired pitch)   Cognition Arousal/Alertness: Awake/alert Behavior During Therapy: WFL for tasks assessed/performed Overall Cognitive Status: Within Functional Limits for tasks assessed                                     General Comments  pt with multiple questions re: diplopia, expected outcomes, etc.    Exercises Exercises: Other exercises Other Exercises Other Exercises: Occlusion glasses applied with nasal portion of Lt lens taped to reduce diplopia while allowing pt to maintain eyes working binocularly, and to allow peripheral input needed to maintain verticality Other Exercises: with pt seated EOB, worked on upper trunk and lower trunk in itiated weight shifts without UE support and progressing to lower trunk initiated lateral flexion with min guard assist and no LOB.    Shoulder Instructions      Home Living Family/patient expects to be discharged to:: Private residence Living Arrangements: Children;Spouse/significant other Available Help at Discharge: Family;Available 24 hours/day Type of Home: House Home Access: Stairs to enter [employer] of Steps: 1   Home Layout: Multi-level Alternate Level Stairs-Number of Steps: 4 Alternate Level Stairs-Rails: Left Bathroom Shower/Tub: Walk-in shower         Home Equipment: Bedside commode;Shower seat - built in;Walker - 2 wheels;Cane - single point   Additional Comments: Lives with wife      Prior Functioning/Environment Level of Independence: Independent        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        OT Problem List: Decreased activity tolerance;Impaired balance (sitting and/or standing);Impaired vision/perception;Decreased coordination;Decreased cognition;Decreased safety awareness;Impaired sensation;Impaired UE functional use      OT Treatment/Interventions: Self-care/ADL training;Neuromuscular education;DME and/or AE instruction;Therapeutic activities;Visual/perceptual remediation/compensation;Patient/family education;Balance training    OT Goals(Current goals can be found in the care plan section) Acute Rehab OT Goals Patient Stated Goal: to get back to normal OT Goal Formulation: With patient/family Time For Goal Achievement: 11/01/20 Potential to Achieve Goals: Good ADL Goals Pt Will Perform Grooming: with min assist;standing Pt Will Perform Upper Body Bathing: with set-up;with supervision;sitting Pt Will Perform Lower Body Bathing: with min assist;sit to/from stand Pt Will Perform Upper Body Dressing: with set-up;with supervision;sitting Pt Will Perform Lower Body Dressing: with min assist;sit to/from stand Pt Will Transfer to Toilet: with min  assist;ambulating;regular height  toilet;bedside commode;grab bars Pt Will Perform Toileting - Clothing Manipulation and hygiene: with min assist;sit to/from stand Additional ADL Goal #1: Pt will be independent with HEP for vision  OT Frequency: Min 2X/week   Barriers to D/C:            Co-evaluation              AM-PAC OT "6 Clicks" Daily Activity     Outcome Measure Help from another person eating meals?: None Help from another person taking care of personal grooming?: A Lot Help from another person toileting, which includes using toliet, bedpan, or urinal?: A Lot Help from another person bathing (including washing, rinsing, drying)?: A Lot Help from another person to put on and taking off regular upper body clothing?: A Lot Help from another person to put on and taking off regular lower body clothing?: A Lot 6 Click Score: 14   End of Session Nurse Communication: Mobility status  Activity Tolerance: Patient tolerated treatment well Patient left: in bed;with call bell/phone within reach;with bed alarm set;with family/visitor present  OT Visit Diagnosis: Unsteadiness on feet (R26.81);Ataxia, unspecified (R27.0);Dizziness and giddiness (R42)                Time: 1420-1510 OT Time Calculation (min): 50 min Charges:  OT General Charges $OT Visit: 1 Visit OT Evaluation $OT Eval Moderate Complexity: 1 Mod OT Treatments $Neuromuscular Re-education: 23-37 mins  Eber Jones., OTR/L Acute Rehabilitation Services Pager [phone] Office [phone]   Jeani Hawking M 10/18/2020, 3:41 PM

## 2020-10-19 LAB — GLUCOSE, CAPILLARY
Glucose-Capillary: 98 mg/dL (ref 70–99)
Glucose-Capillary: 100 mg/dL — ABNORMAL HIGH (ref 70–99)
Glucose-Capillary: 101 mg/dL — ABNORMAL HIGH (ref 70–99)
Glucose-Capillary: 147 mg/dL — ABNORMAL HIGH (ref 70–99)

## 2020-10-19 MED ORDER — ADULT MULTIVITAMIN W/MINERALS CH
1.0000 | ORAL_TABLET | Freq: Every day | ORAL | Status: DC
  Administered 2020-10-19 – 2020-10-21 (×3): 1 via ORAL
  Filled 2020-10-19 (×3): qty 1

## 2020-10-19 MED ORDER — ENSURE ENLIVE PO LIQD
237.0000 mL | Freq: Two times a day (BID) | ORAL | Status: DC
  Administered 2020-10-20 – 2020-10-21 (×2): 237 mL via ORAL
  Filled 2020-10-19: qty 237

## 2020-10-19 NOTE — Consult Note (Signed)
Physical Medicine and Rehabilitation Consult Reason for Consult: Left side numbness, incoordination and headache Referring Physician: Dr. Marlin CanaryJessica Vann   HPI: Caryl BisJeffery Devonshire Sr. is a 65 y.o. right-handed male with history of BPH hyperlipidemia hypertension, presumed TIA September 2021 maintained on aspirin 81 mg daily.  Per chart review patient lives with spouse daughter and grandson.  Multilevel home.  Independent prior to admission.  Patient still works full-time.  Presented 10/17/2020 with left-sided numbness incoordination and headache of acute onset.  Noted systolic blood pressures 180s to 190s.  CT/MRI showed no acute intracranial abnormality.  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  CT angiogram of head and neck negative CTA for emergent large vessel occlusion.  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  Multifocal severe left P2 stenosis.  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  Echocardiogram with ejection fraction of 60 to 65% no wall motion abnormalities grade 1 diastolic dysfunction.  Patient did not receive tPA.  Follow-up MRI showed new focus of abnormal diffusion restriction within the left dorsal medulla oblongata consistent with acute to subacute infarction.  Admission chemistries unremarkable aside glucose 105 BUN 24, hemoglobin A1c 5.8.  Currently maintained on aspirin 325 mg daily and Plavix 75 mg daily for CVA prophylaxis.  Subcutaneous Lovenox for DVT prophylaxis.  Awaiting plan for cerebral angiogram.  Therapy evaluations completed due to patient decreased functional mobility left side numbness recommendations of physical medicine rehab consult.  Patient just has come back from cerebral angiogram.  Initially access was tried at the right groin but then moved up to the right  Radial.  Currently the patient does not move  the right upper or right lower limb for least 4 hours. Patient notes that he does not feel cold on the left side when he swallows cold water Swallowing liquids normally per patient.  Solids needs to chew a little extra.  Review of Systems  Constitutional: Negative for chills and fever.  HENT: Negative for hearing loss.   Eyes: Negative for blurred vision and double vision.  Respiratory: Negative for cough and shortness of breath.   Cardiovascular: Negative for chest pain, palpitations and leg swelling.  Gastrointestinal: Positive for constipation. Negative for heartburn, nausea and vomiting.  Genitourinary: Negative for dysuria, flank pain and hematuria.  Musculoskeletal: Positive for joint pain and myalgias.  Skin: Negative for rash.  Neurological: Positive for dizziness, sensory change, weakness and headaches.  All other systems reviewed and are negative.  Past Medical History:  Diagnosis Date  . BPH (benign prostatic hyperplasia)   . Dyslipidemia   . Hypertension   . Impaired glucose tolerance   . TIA (transient ischemic attack) 02/2020   Past Surgical History:  Procedure Laterality Date  . TOTAL KNEE ARTHROPLASTY     Family History  Problem Relation Age of Onset  . Hypertension Mother   . CVA Sister   . Lupus Sister   . CVA Brother [id number]   Social History:  reports that he has never smoked. He has never used smokeless tobacco. He reports that he does not drink alcohol and does not use drugs. Allergies: No Known Allergies Medications Prior to Admission  Medication Sig Dispense Refill  . acetaminophen (TYLENOL) 650 MG CR tablet Take 1,300 mg by mouth in the morning and at bedtime.    Marland Kitchen. aspirin EC 81 MG tablet Take 162 mg  by mouth in the morning and at bedtime. Swallow whole.    . Calcium Carb-Cholecalciferol 500-400 MG-UNIT TABS Take 1 tablet by mouth daily.    . cholecalciferol (VITAMIN D) 25 MCG (1000 UNIT) tablet Take 1,000 Units by mouth daily.    . finasteride (PROSCAR)  5 MG tablet Take 5 mg by mouth daily.    Marland Kitchen gabapentin (NEURONTIN) 300 MG capsule Take 300 mg by mouth daily.    Marland Kitchen lisinopril (ZESTRIL) 20 MG tablet Take 20 mg by mouth 2 (two) times daily.    . metFORMIN (GLUCOPHAGE) 500 MG tablet Take 500 mg by mouth 2 (two) times daily.    . rosuvastatin (CRESTOR) 10 MG tablet Take 10 mg by mouth at bedtime.    . tamsulosin (FLOMAX) 0.4 MG CAPS capsule Take 0.8 mg by mouth daily.    . vitamin C (ASCORBIC ACID) 500 MG tablet Take 500 mg by mouth daily.      Home: Home Living Family/patient expects to be discharged to:: Private residence Living Arrangements: Children,Spouse/significant other Available Help at Discharge: Family,Available 24 hours/day Type of Home: House Home Access: Stairs to enter [employer] of Steps: 1 Home Layout: Multi-level Alternate Level Stairs-Number of Steps: 4 Alternate Level Stairs-Rails: Left Bathroom Shower/Tub: Walk-in shower Home Equipment: Bedside commode,Shower seat - built in,Walker - 2 wheels,Cane - single point Additional Comments: Lives with wife  Functional History: Prior Function Level of Independence: Independent 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 Functional Status:  Mobility: Bed Mobility Overal bed mobility: Needs Assistance Bed Mobility: Supine to Sit,Sit to Supine Supine to sit: Min assist Sit to supine: Min assist General bed mobility comments: assist for balance upon moving to EOB and assist to control descent when moving shoulders back toward the bed Transfers Overall transfer level: Needs assistance Equipment used: None Transfers: Sit to/from Stand Sit to Stand: Min assist General transfer comment: MinA to rise and steady x 2, pt holding onto L bed rail, utilizing wide BOS for comfort.      ADL: ADL Overall ADL's : Needs assistance/impaired Eating/Feeding:  Modified independent,Bed level Grooming: Wash/dry hands,Wash/dry face,Oral care,Moderate assistance,Sitting Grooming Details (indicate cue type and reason): mod A for sitting balance EOB Upper Body Bathing: Moderate assistance,Sitting Upper Body Bathing Details (indicate cue type and reason): mod A for sitting balance EOB Lower Body Bathing: Maximal assistance,Bed level,Sitting/lateral leans Upper Body Dressing : Sitting,Maximal assistance Upper Body Dressing Details (indicate cue type and reason): max A for sitting balance EOB Lower Body Dressing: Maximal assistance,Sitting/lateral leans,Bed level Lower Body Dressing Details (indicate cue type and reason): able to doff Rt sock with mod A for balance, but unable to don it due to significant Lt lateral lean Toilet Transfer: Total assistance Toilet Transfer Details (indicate cue type and reason): unable Toileting- Clothing Manipulation and Hygiene: Total assistance,Sitting/lateral lean,Bed level Functional mobility during ADLs: Maximal assistance,Moderate assistance (bed mobility)  Cognition: Cognition Overall Cognitive Status: Within Functional Limits for tasks assessed Orientation Level: Oriented X4 Cognition Arousal/Alertness: Awake/alert Behavior During Therapy: WFL for tasks assessed/performed Overall Cognitive Status: Within Functional Limits for tasks assessed General Comments: Descriptive and aware of deficits  Blood pressure (!) 153/93, pulse 62, temperature 98.6 F (37 C), temperature source Oral, resp. rate 20, height 6\' 1"  (1.854 m), weight 120.2 kg, SpO2 98 %. Physical Exam Vitals and nursing note reviewed.  Constitutional:      Appearance: He is obese.  HENT:  Head: Normocephalic and atraumatic.  Eyes:     Extraocular Movements: Extraocular movements intact.     Conjunctiva/sclera: Conjunctivae normal.     Pupils: Pupils are equal, round, and reactive to light.  Cardiovascular:     Rate and Rhythm: Normal rate and  regular rhythm.  Pulmonary:     Effort: Pulmonary effort is normal. No respiratory distress.     Breath sounds: Normal breath sounds. No wheezing.  Abdominal:     General: Abdomen is flat. Bowel sounds are normal. There is no distension.     Palpations: Abdomen is soft.  Musculoskeletal:     Cervical back: Normal range of motion and neck supple.     Comments: There is swelling at the right wrist radial aspect, no bleeding Right upper and right lower limb range of motion not tested secondary to restrictions Left upper extremity left lower extremity range of motion is full  Skin:    General: Skin is warm and dry.  Neurological:     Mental Status: He is alert and oriented to person, place, and time.     Comments: Patient is alert.  Makes eye contact with examiner.  Follows simple commands.  Provides name and age.  Left facial sensation is reduced in V1 V2 V3  Limb sensation intact bilateral upper and lower limbs to light touch.  Motor strength is 5/5 in the left deltoid bicep tricep grip hip flexor knee extensor ankle dorsiflexor Cerebellar dysmetria with past-pointing left finger-nose-finger Left pupil abduction disconjugate during left lateral gaze  Psychiatric:        Mood and Affect: Mood normal.        Behavior: Behavior normal.   Voice is hoarse  Results for orders placed or performed during the hospital encounter of 10/17/20 (from the past 24 hour(s))  Glucose, capillary     Status: None   Collection Time: 10/19/20  6:09 AM  Result Value Ref Range   Glucose-Capillary 98 70 - 99 mg/dL  Glucose, capillary     Status: Abnormal   Collection Time: 10/19/20 11:02 AM  Result Value Ref Range   Glucose-Capillary 100 (H) 70 - 99 mg/dL   Comment 1 Notify RN   Glucose, capillary     Status: Abnormal   Collection Time: 10/19/20  3:58 PM  Result Value Ref Range   Glucose-Capillary 101 (H) 70 - 99 mg/dL  Glucose, capillary     Status: Abnormal   Collection Time: 10/19/20  9:05 PM   Result Value Ref Range   Glucose-Capillary 147 (H) 70 - 99 mg/dL   ECHOCARDIOGRAM COMPLETE  Result Date: 10/18/2020    ECHOCARDIOGRAM REPORT   Patient Name:   Haston Casebolt Sr. Date of Exam: 10/18/2020 Medical Rec #:  [id number]        Height:       73.0 in Accession #:    [id number]       Weight:       265.0 lb Date of Birth:  15-May-1956        BSA:          2.425 m Patient Age:    65 years         BP:           117/52 mmHg Patient Gender: M                HR:           67 bpm. Exam Location:  Inpatient Procedure: 2D Echo Indications:  TIA  History:        Patient has no prior history of Echocardiogram examinations.                 Risk Factors:Hypertension and Dyslipidemia.  Sonographer:    Delcie Roch Referring Phys: [id number] JENNIFER YATES IMPRESSIONS  1. Left ventricular ejection fraction, by estimation, is 60 to 65%. The left ventricle has normal function. The left ventricle has no regional wall motion abnormalities. There is mild left ventricular hypertrophy. Left ventricular diastolic parameters are consistent with Grade I diastolic dysfunction (impaired relaxation).  2. Right ventricular systolic function is normal. The right ventricular size is normal.  3. The mitral valve is normal in structure. Trivial mitral valve regurgitation. No evidence of mitral stenosis.  4. The aortic valve is tricuspid. There is mild thickening of the aortic valve. Aortic valve regurgitation is not visualized. No aortic stenosis is present. Conclusion(s)/Recommendation(s): No intracardiac source of embolism detected on this transthoracic study. A transesophageal echocardiogram is recommended to exclude cardiac source of embolism if clinically indicated. FINDINGS  Left Ventricle: Left ventricular ejection fraction, by estimation, is 60 to 65%. The left ventricle has normal function. The left ventricle has no regional wall motion abnormalities. The left ventricular internal cavity size was normal in size. There is  mild left  ventricular hypertrophy. Left ventricular diastolic parameters are consistent with Grade I diastolic dysfunction (impaired relaxation). Right Ventricle: The right ventricular size is normal. No increase in right ventricular wall thickness. Right ventricular systolic function is normal. Left Atrium: Left atrial size was normal in size. Right Atrium: Right atrial size was normal in size. Pericardium: There is no evidence of pericardial effusion. Mitral Valve: The mitral valve is normal in structure. Trivial mitral valve regurgitation. No evidence of mitral valve stenosis. Tricuspid Valve: The tricuspid valve is normal in structure. Tricuspid valve regurgitation is trivial. No evidence of tricuspid stenosis. Aortic Valve: The aortic valve is tricuspid. There is mild thickening of the aortic valve. Aortic valve regurgitation is not visualized. No aortic stenosis is present. Pulmonic Valve: The pulmonic valve was normal in structure. Pulmonic valve regurgitation is trivial. No evidence of pulmonic stenosis. Aorta: The aortic root is normal in size and structure. Venous: The inferior vena cava was not well visualized. IAS/Shunts: The interatrial septum appears to be lipomatous. No atrial level shunt detected by color flow Doppler.  LEFT VENTRICLE PLAX 2D LVIDd:         4.90 cm  Diastology LVIDs:         3.00 cm  LV e' lateral:   7.51 cm/s LV PW:         1.10 cm  LV E/e' lateral: 12.6 LV IVS:        0.90 cm LVOT diam:     2.40 cm LV SV:         116 LV SV Index:   48 LVOT Area:     4.52 cm  RIGHT VENTRICLE TAPSE (M-mode): 2.2 cm LEFT ATRIUM             Index       RIGHT ATRIUM           Index LA diam:        4.20 cm 1.73 cm/m  RA Area:     12.30 cm LA Vol (A2C):   77.6 ml 32.00 ml/m RA Volume:   25.30 ml  10.43 ml/m LA Vol (A4C):   54.6 ml 22.52 ml/m LA Biplane Vol: 65.3 ml 26.93  ml/m  AORTIC VALVE LVOT Vmax:   124.00 cm/s LVOT Vmean:  80.200 cm/s LVOT VTI:    0.256 m  AORTA Ao Root diam: 3.60 cm Ao Asc diam:  3.40  cm MITRAL VALVE                TRICUSPID VALVE MV Area (PHT): 3.08 cm     TR Peak grad:   21.3 mmHg MV Decel Time: 246 msec     TR Vmax:        231.00 cm/s MV E velocity: 94.30 cm/s MV A velocity: 118.00 cm/s  SHUNTS MV E/A ratio:  0.80         Systemic VTI:  0.26 m                             Systemic Diam: 2.40 cm Weston Brass MD Electronically signed by Weston Brass MD Signature Date/Time: 10/18/2020/5:01:51 PM    Final     Assessment/Plan: Diagnosis: 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 Does the need for close, 24 hr/day medical supervision in concert with the patient's rehab needs make it unreasonable for this patient to be served in a less intensive setting? Yes 2. Co-Morbidities requiring supervision/potential complications: History of TIA, morbid obesity, uncontrolled hypertension, urinary incontinence 3. Due to bladder management, bowel management, safety, skin/wound care, disease management, medication administration, pain management, patient education and Currently with external catheter, does the patient require 24 hr/day rehab nursing? Yes 4. Does the patient require coordinated care of a physician, rehab nurse, therapy disciplines of PT, OT, speech to address physical and functional deficits in the context of the above medical diagnosis(es)? Yes Addressing deficits in the following areas: balance, endurance, locomotion, strength, transferring, bowel/bladder control, bathing, dressing, feeding, grooming, toileting, swallowing and psychosocial support 5. Can the patient actively participate in an intensive therapy program of at least 3 hrs of therapy per day at least 5 days per week? Yes 6. The potential for patient to make measurable gains while on inpatient rehab is excellent 7. Anticipated functional outcomes upon discharge from inpatient rehab are modified independent and supervision  with PT,  modified independent and supervision with OT, independent with SLP. 8. Estimated rehab length of stay to reach the above functional goals is: 10 to 14 days 9. Anticipated discharge destination: Home 10. Overall Rehab/Functional Prognosis: good  RECOMMENDATIONS: This patient's condition is appropriate for continued rehabilitative care in the following setting: CIR Patient has agreed to participate in recommended program. Yes Note that insurance prior authorization may be required for reimbursement for recommended care.  Comment: Need to know if there are any stenting procedures planned in the next couple days prior to admission to rehab.  IR report and follow-up are pending   Charlton Amor, PA-C 10/20/2020   "I have personally performed a face to face diagnostic evaluation of this patient.  Additionally, I have reviewed and concur with the physician assistant's documentation above." Erick Colace M.D. [facility] Fellow Am [employer] of [employer] Med and Rehab Diplomate Am Board of Electrodiagnostic Med Fellow Am Board of Interventional Pain

## 2020-10-19 NOTE — Progress Notes (Signed)
Initial Nutrition Assessment  DOCUMENTATION CODES:   Not applicable  INTERVENTION:   Liberalize diet to REGULAR    Ensure Enlive po BID, each supplement provides 350 kcal and 20 grams of protein  MVI daily   NUTRITION DIAGNOSIS:   Increased nutrient needs related to acute illness as evidenced by estimated needs.  GOAL:   Patient will meet greater than or equal to 90% of their needs  MONITOR:   PO intake,Supplement acceptance,Weight trends,Labs,I & O's  REASON FOR ASSESSMENT:   Consult Assessment of nutrition requirement/status  ASSESSMENT:   Patient with PMH significant for HTN, BPH, dyslipidemia, and TIA. Presents this admission with intracranial stenosis.  Plan cerebral arteriogram tomorrow.   Attempted to call patient x2, no answer. No meal completions documented. Liberalize diet to regular to provide more option. RD to provide supplementation to maximize kcal and protein.   Unable to obtain UBW. Weight history limited in chart. Will attempt to obtain nutrition/weight history at later date if possible.   Medications: SS novolog Labs: CBG 98-148  Diet Order:   Diet Order            Diet NPO time specified Except for: Sips with Meds  Diet effective now           Diet Heart Room service appropriate? Yes; Fluid consistency: Thin  Diet effective ____                 EDUCATION NEEDS:   Not appropriate for education at this time  Skin:  Skin Assessment: Reviewed RN Assessment  Last BM:  5/7  Height:   Ht Readings from Last 1 Encounters:  10/17/20 6\' 1"  (1.854 m)    Weight:   Wt Readings from Last 1 Encounters:  10/17/20 120.2 kg    BMI:  Body mass index is 34.96 kg/m.  Estimated Nutritional Needs:   Kcal:  2100-2300 kcal  Protein:  105-120 grams  Fluid:  >/= 2 L/day  12/17/20 RD, LDN Clinical Nutrition Pager listed in AMION

## 2020-10-19 NOTE — Progress Notes (Signed)
Progress Note    Dennis Bis Sr.  YQM:[id number] DOB: 07/31/1955  DOA: 10/17/2020 PCP: Ignacia Palma., MD    Brief Narrative:     Medical records reviewed and are as summarized below:  Dennis Bis Sr. is an 65 y.o. male with medical history significant of HTN; IGT; elevated PSA; TKR 4 months ago; and TIA (02/2020) presenting with neurologic changes.He notices L face and foot tingling and voice changes.   Over the last 1-2 weeks, he has had severe vertigo and was prescribed Meclizine.  Last night, he noticed severe headache that woke him from sleep in his left temple.  Symptoms were similar to prior TIA.  +CVA on MRI  Assessment/Plan:   Principal Problem:   Acute CVA (cerebrovascular accident) (HCC) Active Problems:   Hypertension   Dyslipidemia   BPH (benign prostatic hyperplasia)   Impaired glucose tolerance   Vertigo   Occlusion of left vertebral artery   CVA -Patient presenting with hypophonia, severe vertigo, and L face and body tingling - MRI: New focus of abnormal diffusion restriction within the left dorsal medulla oblongata, consistent with acute to subacute infarct., likely plan for cerebral angiogram on Monday -Echo -Neurology consult: Dual antiplatelet is recommended for 3 months.   -PT/OT -asa/plavix  HTN -Allow permissive HTNfor now -Holdlisinopril and plan to restart after angiogram  HLD -Check FLP - 156/38/80/189 -Continue Crestor but increase to 20 mg daily for goal LDL <70  IGT -A1c is 5.8, indicating good control -Hold home PO Metformin -Will order moderate-scale SSI -Continue Neurontin  BPH -Continue Proscar -Hold tamsulosin for now  obesity Body mass index is 34.96 kg/m.   Family Communication/Anticipated D/C date and plan/Code Status   DVT prophylaxis: Lovenox ordered. Code Status: Full Code.  Disposition Plan: Status is: Inpatient  Remains inpatient appropriate because:IV treatments appropriate due to intensity of  illness or inability to take PO and Inpatient level of care appropriate due to severity of illness   Dispo: The patient is from: Home              Anticipated d/c is to: Home              Patient currently is not medically stable to d/c.   Difficult to place patient No         Medical Consultants:    IR  neurology  Subjective:   Asking to change clothes today  Objective:    Vitals:   10/18/20 1959 10/18/20 2338 10/19/20 0408 10/19/20 1202  BP: (!) 170/91 (!) 167/89 (!) 161/94 (!) 148/73  Pulse: 67 62 (!) 59 68  Resp: Temp: 99.1 F (37.3 C) 98.6 F (37 C) 98.4 F (36.9 C) 98.4 F (36.9 C)  TempSrc: Oral Oral Oral Oral  SpO2: 96% 97% 95% 99%  Weight:      Height:        Intake/Output Summary (Last 24 hours) at 10/19/2020 1331 Last data filed at 10/18/2020 2002 Gross per 24 hour  Intake --  Output 1400 ml  Net -1400 ml   Filed Weights   10/17/20 1806  Weight: 120.2 kg    Exam:  General: Appearance:    Obese male in no acute distress     Lungs:     respirations unlabored  Heart:    Normal heart rate. Normal rhythm. No murmurs, rubs, or gallops.   MS:   All extremities are intact.   Neurologic:   Awake, alert, oriented  x 3     Data Reviewed:   I have personally reviewed following labs and imaging studies:  Labs: Labs show the following:   Basic Metabolic Panel: Recent Labs  Lab 10/17/20 0218 10/17/20 0227  NA 141 142  K 4.1 4.0  CL 106 108  CO2 26  --   GLUCOSE 105* 107*  BUN 24* 24*  CREATININE 1.17 1.10  CALCIUM 9.4  --    GFR Estimated Creatinine Clearance: 89.7 mL/min (by C-G formula based on SCr of 1.1 mg/dL). Liver Function Tests: Recent Labs  Lab 10/17/20 0218  AST 27  ALT 28  ALKPHOS 75  BILITOT 0.4  PROT 7.0  ALBUMIN 4.2   No results for input(s): LIPASE, AMYLASE in the last 168 hours. No results for input(s): AMMONIA in the last 168 hours. Coagulation profile Recent Labs  Lab 10/17/20 0218  INR  1.0    CBC: Recent Labs  Lab 10/17/20 0218 10/17/20 0227  WBC 7.0  --   NEUTROABS 4.4  --   HGB 13.9 13.9  HCT 43.9 41.0  MCV 88.9  --   PLT 151  --    Cardiac Enzymes: No results for input(s): CKTOTAL, CKMB, CKMBINDEX, TROPONINI in the last 168 hours. BNP (last 3 results) No results for input(s): PROBNP in the last 8760 hours. CBG: Recent Labs  Lab 10/18/20 0606 10/18/20 1104 10/18/20 2113 10/19/20 0609 10/19/20 1102  GLUCAP 109* 102* 104* 98 100*   D-Dimer: No results for input(s): DDIMER in the last 72 hours. Hgb A1c: Recent Labs    10/17/20 1017  HGBA1C 5.8*   Lipid Profile: Recent Labs    10/17/20 1017  CHOL 156  HDL 38*  LDLCALC 80  TRIG 937*  CHOLHDL 4.1   Thyroid function studies: No results for input(s): TSH, T4TOTAL, T3FREE, THYROIDAB in the last 72 hours.  Invalid input(s): FREET3 Anemia work up: No results for input(s): VITAMINB12, FOLATE, FERRITIN, TIBC, IRON, RETICCTPCT in the last 72 hours. Sepsis Labs: Recent Labs  Lab 10/17/20 0218  WBC 7.0    Microbiology Recent Results (from the past 240 hour(s))  SARS CORONAVIRUS 2 (TAT 6-24 HRS) Nasopharyngeal Nasopharyngeal Swab     Status: None   Collection Time: 10/17/20  9:13 AM   Specimen: Nasopharyngeal Swab  Result Value Ref Range Status   SARS Coronavirus 2 NEGATIVE NEGATIVE Final    Comment: (NOTE) SARS-CoV-2 target nucleic acids are NOT DETECTED.  The SARS-CoV-2 RNA is generally detectable in upper and lower respiratory specimens during the acute phase of infection. Negative results do not preclude SARS-CoV-2 infection, do not rule out co-infections with other pathogens, and should not be used as the sole basis for treatment or other patient management decisions. Negative results must be combined with clinical observations, patient history, and epidemiological information. The expected result is Negative.  Fact Sheet for  Patients: HairSlick.no  Fact Sheet for Healthcare Providers: quierodirigir.com  This test is not yet approved or cleared by the [place] FDA and  has been authorized for detection and/or diagnosis of SARS-CoV-2 by FDA under an Emergency Use Authorization (EUA). This EUA will remain  in effect (meaning this test can be used) for the duration of the COVID-19 declaration under Se ction 564(b)(1) of the Act, 21 U.S.C. section 360bbb-3(b)(1), unless the authorization is terminated or revoked sooner.  Performed at [facility], [address] [place], [place] [address]     Procedures and diagnostic studies:  MR BRAIN WO CONTRAST  Result Date: 10/18/2020 CLINICAL DATA:  Stroke follow-up EXAM: MRI HEAD WITHOUT CONTRAST TECHNIQUE: Multiplanar, multiecho pulse sequences of the brain and surrounding structures were obtained without intravenous contrast. COMPARISON:  10/17/2020 brain MRI FINDINGS: Brain: There is a new focus of abnormal diffusion restriction within the left dorsal medulla oblongata (5:56). No other diffusion abnormality. No acute hemorrhage. No mass effect. There is multifocal hyperintense T2-weighted signal within the white matter. Parenchymal volume and CSF spaces are normal. The midline structures are normal. Vascular: Major flow voids are preserved. Skull and upper cervical spine: Normal calvarium and skull base. Visualized upper cervical spine and soft tissues are normal. Sinuses/Orbits:No paranasal sinus fluid levels or advanced mucosal thickening. No mastoid or middle ear effusion. Normal orbits. IMPRESSION: New focus of abnormal diffusion restriction within the left dorsal medulla oblongata, consistent with acute to subacute infarct. Electronically Signed   By: Deatra Robinson M.D.   On: 10/18/2020 01:01   ECHOCARDIOGRAM COMPLETE  Result Date: 10/18/2020    ECHOCARDIOGRAM REPORT   Patient Name:   Dennis Quevedo Sr.  Date of Exam: 10/18/2020 Medical Rec #:  [id number]        Height:       73.0 in Accession #:    [id number]       Weight:       265.0 lb Date of Birth:  04-Apr-1956        BSA:          2.425 m Patient Age:    65 years         BP:           117/52 mmHg Patient Gender: M                HR:           67 bpm. Exam Location:  Inpatient Procedure: 2D Echo Indications:    TIA  History:        Patient has no prior history of Echocardiogram examinations.                 Risk Factors:Hypertension and Dyslipidemia.  Sonographer:    Delcie Roch Referring Phys: [id number] JENNIFER YATES IMPRESSIONS  1. Left ventricular ejection fraction, by estimation, is 60 to 65%. The left ventricle has normal function. The left ventricle has no regional wall motion abnormalities. There is mild left ventricular hypertrophy. Left ventricular diastolic parameters are consistent with Grade I diastolic dysfunction (impaired relaxation).  2. Right ventricular systolic function is normal. The right ventricular size is normal.  3. The mitral valve is normal in structure. Trivial mitral valve regurgitation. No evidence of mitral stenosis.  4. The aortic valve is tricuspid. There is mild thickening of the aortic valve. Aortic valve regurgitation is not visualized. No aortic stenosis is present. Conclusion(s)/Recommendation(s): No intracardiac source of embolism detected on this transthoracic study. A transesophageal echocardiogram is recommended to exclude cardiac source of embolism if clinically indicated. FINDINGS  Left Ventricle: Left ventricular ejection fraction, by estimation, is 60 to 65%. The left ventricle has normal function. The left ventricle has no regional wall motion abnormalities. The left ventricular internal cavity size was normal in size. There is  mild left ventricular hypertrophy. Left ventricular diastolic parameters are consistent with Grade I diastolic dysfunction (impaired relaxation). Right Ventricle: The right ventricular size  is normal. No increase in right ventricular wall thickness. Right ventricular systolic function is normal. Left Atrium: Left atrial size was normal in size. Right Atrium: Right atrial size was normal in  size. Pericardium: There is no evidence of pericardial effusion. Mitral Valve: The mitral valve is normal in structure. Trivial mitral valve regurgitation. No evidence of mitral valve stenosis. Tricuspid Valve: The tricuspid valve is normal in structure. Tricuspid valve regurgitation is trivial. No evidence of tricuspid stenosis. Aortic Valve: The aortic valve is tricuspid. There is mild thickening of the aortic valve. Aortic valve regurgitation is not visualized. No aortic stenosis is present. Pulmonic Valve: The pulmonic valve was normal in structure. Pulmonic valve regurgitation is trivial. No evidence of pulmonic stenosis. Aorta: The aortic root is normal in size and structure. Venous: The inferior vena cava was not well visualized. IAS/Shunts: The interatrial septum appears to be lipomatous. No atrial level shunt detected by color flow Doppler.  LEFT VENTRICLE PLAX 2D LVIDd:         4.90 cm  Diastology LVIDs:         3.00 cm  LV e' lateral:   7.51 cm/s LV PW:         1.10 cm  LV E/e' lateral: 12.6 LV IVS:        0.90 cm LVOT diam:     2.40 cm LV SV:         116 LV SV Index:   48 LVOT Area:     4.52 cm  RIGHT VENTRICLE TAPSE (M-mode): 2.2 cm LEFT ATRIUM             Index       RIGHT ATRIUM           Index LA diam:        4.20 cm 1.73 cm/m  RA Area:     12.30 cm LA Vol (A2C):   77.6 ml 32.00 ml/m RA Volume:   25.30 ml  10.43 ml/m LA Vol (A4C):   54.6 ml 22.52 ml/m LA Biplane Vol: 65.3 ml 26.93 ml/m  AORTIC VALVE LVOT Vmax:   124.00 cm/s LVOT Vmean:  80.200 cm/s LVOT VTI:    0.256 m  AORTA Ao Root diam: 3.60 cm Ao Asc diam:  3.40 cm MITRAL VALVE                TRICUSPID VALVE MV Area (PHT): 3.08 cm     TR Peak grad:   21.3 mmHg MV Decel Time: 246 msec     TR Vmax:        231.00 cm/s MV E velocity: 94.30  cm/s MV A velocity: 118.00 cm/s  SHUNTS MV E/A ratio:  0.80         Systemic VTI:  0.26 m                             Systemic Diam: 2.40 cm Weston BrassGayatri Acharya MD Electronically signed by Weston BrassGayatri Acharya MD Signature Date/Time: 10/18/2020/5:01:51 PM    Final     Medications:   .  stroke: mapping our early stages of recovery book   Does not apply Once  . acetaminophen  500 mg Oral BID  . aspirin  325 mg Oral Daily  . clopidogrel  75 mg Oral Daily  . enoxaparin (LOVENOX) injection  40 mg Subcutaneous Q24H  . feeding supplement  237 mL Oral BID BM  . finasteride  5 mg Oral Daily  . gabapentin  300 mg Oral Daily  . insulin aspart  0-9 Units Subcutaneous TID WC  . multivitamin with minerals  1 tablet Oral Daily  . rosuvastatin  40  mg Oral QHS   Continuous Infusions:    LOS: 2 days   Joseph Art  [facility]   How to contact the [facility] Attending or Consulting provider 7A - 7P or covering provider during after hours 7P -7A, for this patient?  1. Check the care team in [facility] and look for a) attending/consulting [facility] provider listed and b) the [facility] team listed 2. Log into [email or web address] and use [place] universal password to access. If you do not have the password, please contact the hospital operator. 3. Locate the [facility] provider you are looking for under [facility] and page to a number that you can be directly reached. 4. If you still have difficulty reaching the provider, please page the [facility] (Director on Call) for the Hospitalists listed on amion for assistance.  10/19/2020, 1:31 PM

## 2020-10-19 NOTE — Plan of Care (Signed)
  Problem: Education: Goal: Knowledge of General Education information will improve Description: Including pain rating scale, medication(s)/side effects and non-pharmacologic comfort measures Outcome: Progressing   Problem: Safety: Goal: Ability to remain free from injury will improve Outcome: Progressing   Problem: Education: Goal: Knowledge of disease or condition will improve Outcome: Progressing Goal: Knowledge of secondary prevention will improve Outcome: Progressing Goal: Knowledge of patient specific risk factors addressed and post discharge goals established will improve Outcome: Progressing Goal: Individualized Educational Video(s) Outcome: Progressing   Problem: Ischemic Stroke/TIA Tissue Perfusion: Goal: Complications of ischemic stroke/TIA will be minimized Outcome: Progressing

## 2020-10-20 ENCOUNTER — Inpatient Hospital Stay (HOSPITAL_COMMUNITY): Payer: Medicare Other

## 2020-10-20 DIAGNOSIS — I639 Cerebral infarction, unspecified: Secondary | ICD-10-CM

## 2020-10-20 HISTORY — PX: IR US GUIDE VASC ACCESS RIGHT: IMG2390

## 2020-10-20 HISTORY — PX: IR ANGIO INTRA EXTRACRAN SEL INTERNAL CAROTID BILAT MOD SED: IMG5363

## 2020-10-20 HISTORY — PX: IR ANGIO VERTEBRAL SEL SUBCLAVIAN INNOMINATE UNI L MOD SED: IMG5364

## 2020-10-20 HISTORY — PX: IR ANGIO VERTEBRAL SEL VERTEBRAL UNI R MOD SED: IMG5368

## 2020-10-20 LAB — PROTEIN ELECTROPHORESIS, SERUM
A/G Ratio: 1.5 (ref 0.7–1.7)
Albumin ELP: 3.8 g/dL (ref 2.9–4.4)
Alpha-1-Globulin: 0.2 g/dL (ref 0.0–0.4)
Alpha-2-Globulin: 0.8 g/dL (ref 0.4–1.0)
Beta Globulin: 1 g/dL (ref 0.7–1.3)
Gamma Globulin: 0.7 g/dL (ref 0.4–1.8)
Globulin, Total: 2.6 g/dL (ref 2.2–3.9)
Total Protein ELP: 6.4 g/dL (ref 6.0–8.5)

## 2020-10-20 LAB — GLUCOSE, CAPILLARY
Glucose-Capillary: 133 mg/dL — ABNORMAL HIGH (ref 70–99)
Glucose-Capillary: 111 mg/dL — ABNORMAL HIGH (ref 70–99)
Glucose-Capillary: 100 mg/dL — ABNORMAL HIGH (ref 70–99)
Glucose-Capillary: 105 mg/dL — ABNORMAL HIGH (ref 70–99)
Glucose-Capillary: 127 mg/dL — ABNORMAL HIGH (ref 70–99)

## 2020-10-20 LAB — ANTIPHOSPHOLIPID SYNDROME EVAL, BLD
Anticardiolipin IgA: <9 APL U/mL (ref 0–11)
Anticardiolipin IgG: <9 GPL U/mL (ref 0–14)
Anticardiolipin IgM: 11 MPL U/mL (ref 0–12)
DRVVT: 41.6 s (ref 0.0–47.0)
PTT Lupus Anticoagulant: 32.6 s (ref 0.0–51.9)
Phosphatydalserine, IgA: <1 APS Units (ref 0–19)
Phosphatydalserine, IgG: <9 Units (ref 0–30)
Phosphatydalserine, IgM: 12 Units (ref 0–30)

## 2020-10-20 LAB — BETA-2-GLYCOPROTEIN I ABS, IGG/M/A
Beta-2 Glyco I IgG: <9 GPI IgG units (ref 0–20)
Beta-2-Glycoprotein I IgA: <9 GPI IgA units (ref 0–25)
Beta-2-Glycoprotein I IgM: <9 GPI IgM units (ref 0–32)

## 2020-10-20 MED ORDER — HEPARIN SODIUM (PORCINE) 1000 UNIT/ML IJ SOLN
INTRAMUSCULAR | Status: AC
  Filled 2020-10-20: qty 1

## 2020-10-20 MED ORDER — VERAPAMIL HCL 2.5 MG/ML IV SOLN
INTRAVENOUS | Status: AC
  Filled 2020-10-20: qty 2

## 2020-10-20 MED ORDER — IOHEXOL 240 MG/ML SOLN
INTRAMUSCULAR | Status: AC
  Filled 2020-10-20: qty 100

## 2020-10-20 MED ORDER — FENTANYL CITRATE (PF) 100 MCG/2ML IJ SOLN
INTRAMUSCULAR | Status: AC | PRN
  Administered 2020-10-20 (×2): 25 ug via INTRAVENOUS

## 2020-10-20 MED ORDER — FENTANYL CITRATE (PF) 100 MCG/2ML IJ SOLN
INTRAMUSCULAR | Status: AC
  Filled 2020-10-20: qty 2

## 2020-10-20 MED ORDER — HYDRALAZINE HCL 20 MG/ML IJ SOLN
INTRAMUSCULAR | Status: AC
  Filled 2020-10-20: qty 1

## 2020-10-20 MED ORDER — HEPARIN SODIUM (PORCINE) 1000 UNIT/ML IJ SOLN
INTRAMUSCULAR | Status: AC | PRN
  Administered 2020-10-20: 5000 [IU] via INTRA_ARTERIAL

## 2020-10-20 MED ORDER — MIDAZOLAM HCL 2 MG/2ML IJ SOLN
INTRAMUSCULAR | Status: AC
  Filled 2020-10-20: qty 2

## 2020-10-20 MED ORDER — MIDAZOLAM HCL 2 MG/2ML IJ SOLN
INTRAMUSCULAR | Status: AC | PRN
  Administered 2020-10-20: 1 mg via INTRAVENOUS
  Administered 2020-10-20: 0.5 mg via INTRAVENOUS

## 2020-10-20 MED ORDER — NITROGLYCERIN 1 MG/10 ML FOR IR/CATH LAB
INTRA_ARTERIAL | Status: AC
  Filled 2020-10-20: qty 10

## 2020-10-20 MED ORDER — IOHEXOL 240 MG/ML SOLN
150.0000 mL | Freq: Once | INTRAMUSCULAR | Status: AC | PRN
  Administered 2020-10-20: 110 mL via INTRAVENOUS

## 2020-10-20 MED ORDER — LIDOCAINE HCL 1 % IJ SOLN
INTRAMUSCULAR | Status: AC
  Filled 2020-10-20: qty 20

## 2020-10-20 MED ORDER — NITROGLYCERIN 1 MG/10 ML FOR IR/CATH LAB
INTRA_ARTERIAL | Status: AC | PRN
  Administered 2020-10-20: 300 ug via INTRA_ARTERIAL

## 2020-10-20 MED ORDER — LIDOCAINE HCL 1 % IJ SOLN
INTRAMUSCULAR | Status: AC | PRN
  Administered 2020-10-20: 10 mL

## 2020-10-20 MED ORDER — HYDRALAZINE HCL 20 MG/ML IJ SOLN
INTRAMUSCULAR | Status: AC | PRN
  Administered 2020-10-20: 5 mg via INTRAVENOUS

## 2020-10-20 MED ORDER — VERAPAMIL HCL 2.5 MG/ML IV SOLN
INTRAVENOUS | Status: AC | PRN
  Administered 2020-10-20: 5 mg via INTRAVENOUS

## 2020-10-20 NOTE — PMR Pre-admission (Signed)
PMR Admission Coordinator Pre-Admission Assessment  Patient: Dennis Bentler Sr. is an 65 y.o., male MRN: [id number] DOB: Sep 10, 1955 Height: 6\' 1"  (185.4 cm) Weight: 120.2 kg  Insurance Information HMO: yes    PPO:      PCP:      IPA:      80/20:      OTHER:  PRIMARY: BCBS Medicare      Policy#:      Subscriber: pt CM Name: [id number]      Phone#: [phone]     Fax#: [phone] Pre-Cert#: tbd on admit      Employer:  Benefits:  Phone #:      Name:  Eff. Date: 06/14/20     Deduct: $0      Out of Pocket Max: $4200 ($824.47)      Life Max: n/a CIR: $335/admit      SNF: 20 full days Outpatient:      Co-Pay: $40/visit Home Health: 100%      Co-Pay:  DME: 80%     Co-Pay: 20% Providers:  SECONDARY:       Policy#:      Phone#:   08/12/20:       Phone#:   The Artist Information Summary" for patients in Inpatient Rehabilitation Facilities with attached "Privacy Act Statement-Health Care Records" was provided and verbally reviewed with: Patient and Family  Emergency Contact Information Contact Information    Name Relation Home Work [place], [place] "[name]" Spouse   [phone]      Current Medical History  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 is a 65 y.o. right-handed male with history of BPH hyperlipidemia hypertension, presumed TIA September 2021 maintained on aspirin 81 mg daily. Presented 10/17/2020 with left-sided numbness incoordination and headache of acute onset.  Noted systolic blood pressures 180s to 190s.  CT/MRI showed no acute intracranial abnormality.  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  CT angiogram of head and neck negative CTA for emergent large vessel occlusion.  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  Multifocal severe left P2 stenosis.   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  Echocardiogram with ejection fraction of 60 to 65% no wall motion abnormalities grade 1 diastolic dysfunction.  Patient did not receive tPA.  Follow-up MRI showed new focus of abnormal diffusion restriction within the left dorsal medulla oblongata consistent with acute to subacute infarction.  Admission chemistries unremarkable aside glucose 105 BUN 24, hemoglobin A1c 5.8.  Currently maintained on aspirin 325 mg daily and Plavix 75 mg daily for CVA prophylaxis.  Subcutaneous Lovenox for DVT prophylaxis.  Awaiting plan for cerebral angiogram.  Therapy evaluations completed due to patient decreased functional mobility left side numbness recommendations of physical medicine rehab consult.  Complete NIHSS TOTAL: 1  Patient's medical record from 12/17/2020 has been reviewed by the rehabilitation admission coordinator and physician.  Past Medical History  Past Medical History:  Diagnosis Date  . BPH (benign prostatic hyperplasia)   . Dyslipidemia   . Hypertension   . Impaired glucose tolerance   . TIA (transient ischemic attack) 02/2020    Family History   family history includes CVA in his sister; CVA (age of onset: 80) in his brother; Hypertension in his mother; Lupus in his sister.  Prior Rehab/Hospitalizations Has the patient  had prior rehab or hospitalizations prior to admission? Yes  Has the patient had major surgery during 100 days prior to admission? Yes   Current Medications  Current Facility-Administered Medications:  .   stroke: mapping our early stages of recovery book, , Does not apply, Once, Jonah BlueYates, Jennifer, MD .  acetaminophen (TYLENOL) tablet 650 mg, 650 mg, Oral, Q4H PRN **OR** acetaminophen (TYLENOL) 160 MG/5ML solution 650 mg, 650 mg, Per Tube, Q4H PRN **OR** acetaminophen (TYLENOL) suppository 650 mg, 650 mg, Rectal, Q4H PRN, Jonah BlueYates, Jennifer, MD .  acetaminophen (TYLENOL) tablet 500 mg, 500 mg,  Oral, BID, Jonah BlueYates, Jennifer, MD, 500 mg at 10/21/20 0902 .  [DISCONTINUED] aspirin suppository 300 mg, 300 mg, Rectal, Daily **OR** aspirin tablet 325 mg, 325 mg, Oral, Daily, Jonah BlueYates, Jennifer, MD, 325 mg at 10/21/20 0902 .  clopidogrel (PLAVIX) tablet 75 mg, 75 mg, Oral, Daily, Marvel PlanXu, Jindong, MD, 75 mg at 10/21/20 0902 .  enoxaparin (LOVENOX) injection 40 mg, 40 mg, Subcutaneous, Q24H, Jonah BlueYates, Jennifer, MD, 40 mg at 10/20/20 1610 .  feeding supplement (ENSURE ENLIVE / ENSURE PLUS) liquid 237 mL, 237 mL, Oral, BID BM, Vann, Jessica U, DO, 237 mL at 10/21/20 0903 .  finasteride (PROSCAR) tablet 5 mg, 5 mg, Oral, Daily, Jonah BlueYates, Jennifer, MD, 5 mg at 10/21/20 0902 .  gabapentin (NEURONTIN) capsule 300 mg, 300 mg, Oral, Daily, Jonah BlueYates, Jennifer, MD, 300 mg at 10/21/20 0903 .  hydrALAZINE (APRESOLINE) injection 5 mg, 5 mg, Intravenous, Q4H PRN, Jonah BlueYates, Jennifer, MD, 5 mg at 10/17/20 1746 .  insulin aspart (novoLOG) injection 0-9 Units, 0-9 Units, Subcutaneous, TID WC, Jonah BlueYates, Jennifer, MD, 1 Units at 10/18/20 1838 .  multivitamin with minerals tablet 1 tablet, 1 tablet, Oral, Daily, Marlin CanaryVann, Jessica U, DO, 1 tablet at 10/21/20 0902 .  rosuvastatin (CRESTOR) tablet 40 mg, 40 mg, Oral, QHS, Marvel PlanXu, Jindong, MD, 40 mg at 10/20/20 2203 .  senna-docusate (Senokot-S) tablet 1 tablet, 1 tablet, Oral, QHS PRN, Jonah BlueYates, Jennifer, MD  Patients Current Diet:  Diet Order            Diet Carb Modified           Diet regular Room service appropriate? Yes; Fluid consistency: Thin  Diet effective now                 Precautions / Restrictions Precautions Precautions: Fall,Other (comment) Precaution Comments: Diplopia Restrictions Weight Bearing Restrictions: No   Has the patient had 2 or more falls or a fall with injury in the past year? 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 Independent  Indoor Mobility: Did the patient need assistance with walking from room to room (with or without device)? Independent  Stairs: Did the patient need assistance with internal or external stairs (with or without device)? Independent  Functional Cognition: Did the patient need help planning regular tasks such as shopping or remembering to take medications? Independent  Home Assistive Devices / Equipment Home Equipment: Bedside commode,Shower seat - built in,Walker - 2 wheels,Cane - single point  Prior Device Use: Indicate devices/aids used by the patient prior to current illness, exacerbation or injury? cane following TKA  Current Functional Level Cognition  Overall Cognitive Status: Within Functional Limits for tasks assessed Orientation Level: Oriented X4 General Comments: Descriptive and aware of deficits    Extremity Assessment (includes Sensation/Coordination)  Upper Extremity Assessment: LUE deficits/detail,RUE deficits/detail RUE Deficits / Details: very mild dysmetria RUE Coordination: decreased fine motor,decreased gross motor LUE Deficits / Details: ataxia noted LUE Sensation: decreased proprioception,decreased light touch LUE Coordination: decreased fine motor,decreased gross motor  Lower Extremity Assessment: Defer to PT evaluation RLE Deficits / Details: Strength 5/5 LLE Deficits / Details: Strength 5/5    ADLs  Overall ADL's : Needs assistance/impaired Eating/Feeding: Modified independent,Bed level Grooming: Wash/dry hands,Wash/dry face,Oral care,Moderate assistance,Sitting Grooming Details (indicate cue type and reason): mod A for sitting balance EOB Upper Body Bathing: Moderate assistance,Sitting Upper Body Bathing Details (indicate cue type and reason): mod A for sitting balance EOB Lower Body Bathing: Maximal assistance,Bed level,Sitting/lateral leans Upper Body Dressing : Sitting,Maximal  assistance Upper Body Dressing Details (indicate cue type and reason): max A for sitting balance EOB Lower Body Dressing: Maximal assistance,Sitting/lateral leans,Bed level Lower Body Dressing Details (indicate cue type and reason): able to doff Rt sock with mod A for balance, but unable to don it due to significant Lt lateral lean Toilet Transfer: Total assistance Toilet Transfer Details (indicate cue type and reason): unable Toileting- Clothing Manipulation and Hygiene: Total assistance,Sitting/lateral lean,Bed level Functional mobility during ADLs: Maximal assistance,Moderate assistance (bed mobility)    Mobility  Overal bed mobility: Needs Assistance Bed Mobility: Supine to Sit,Sit to Supine Supine to sit: Min assist Sit to supine: Min assist General bed mobility comments: assist for balance upon moving to EOB and assist to control descent when moving shoulders back toward the bed    Transfers  Overall transfer level: Needs assistance Equipment used: None Transfers: Sit to/from Stand Sit to Stand: Min assist General transfer comment: MinA to rise and steady x 2, pt holding onto L bed rail, utilizing wide BOS for comfort.    Ambulation / Gait / Stairs / Engineer, drilling / Balance Dynamic Sitting Balance Sitting balance - Comments: Pt requires min guard - max a for sitting balance with heavy lt lateral lean Balance Overall balance assessment: Needs assistance Sitting-balance support: Feet supported Sitting balance-Leahy Scale: Poor Sitting balance - Comments: Pt requires min guard - max a for sitting balance with heavy lt lateral lean Postural control: Left lateral lean Standing balance support: Single extremity supported,During functional activity Standing balance-Leahy Scale: Poor Standing balance comment: reliant on single UE support, wide BOS    Special needs/care consideration Diabetic management yes   Previous Home Environment (from acute therapy  documentation) Living Arrangements: Children,Spouse/significant other Available Help at Discharge: Family,Available 24 hours/day Type of Home: House Home Layout: Multi-level Alternate Level Stairs-Rails: Left Alternate Level Stairs-Number of Steps: 4 Home Access: Stairs to enter Secretary/administrator of Steps: 1 Bathroom Shower/Tub: Radiographer, therapeutic Comments: Lives with wife  Discharge Living Setting Plans for Discharge Living Setting: Lives with (comment) (wife and daughter) Type of Home at Discharge: House Discharge Home Layout: Multi-level,Bed/bath upstairs,Able to live on main level with bedroom/bathroom,Laundry or work area in basement (4 steps to bed/bath, full flight to basement) Alternate Level Stairs-Rails: Left Alternate Level Stairs-Number of Steps: 4 steps to bedroom/bathroom, 14 to basement Discharge Home Access: Stairs to enter Entrance Stairs-Rails: None Entrance Stairs-Number of Steps: 1 Discharge Bathroom Shower/Tub: Walk-in shower Discharge Bathroom Toilet: Handicapped height Discharge Bathroom Accessibility: Yes How Accessible: Accessible via walker Does the patient have any problems obtaining your medications?: No  Social/Family/Support Systems Patient Roles: Spouse Anticipated Caregiver: Azzan Butler (spouse) Anticipated Caregiver's Contact Information: [phone] Ability/Limitations of Caregiver: min assist Caregiver Availability: 24/7 Discharge  Plan Discussed with Primary Caregiver: Yes Is Caregiver In Agreement with Plan?: Yes Does Caregiver/Family have Issues with Lodging/Transportation while Pt is in Rehab?: No  Goals Patient/Family Goal for Rehab: PT/OT supervision, SLP mod I Expected length of stay: 12-16 days Pt/Family Agrees to Admission and willing to participate: Yes Program Orientation Provided & Reviewed with Pt/Caregiver Including Roles  & Responsibilities: Yes  Barriers to Discharge: Insurance for SNF coverage  Decrease burden  of Care through IP rehab admission: n/a  Possible need for SNF placement upon discharge: Not anticipated. Pt with excellent family support.   Patient Condition: I have reviewed medical records from [facility], spoken with CM, and spouse. I discussed via phone for inpatient rehabilitation assessment.  Patient will benefit from ongoing PT, OT and SLP, can actively participate in 3 hours of therapy a day 5 days of the week, and can make measurable gains during the admission.  Patient will also benefit from the coordinated team approach during an Inpatient Acute Rehabilitation admission.  The patient will receive intensive therapy as well as Rehabilitation physician, nursing, social worker, and care management interventions.  Due to safety, disease management, medication administration, pain management and patient education the patient requires 24 hour a day rehabilitation nursing.  The patient is currently min assist with mobility and max assist with basic ADLs.  Discharge setting and therapy post discharge at home with home health is anticipated.  Patient has agreed to participate in the Acute Inpatient Rehabilitation Program and will admit today.  Preadmission Screen Completed By:  Stephania Fragmin, PT, DPT 10/21/2020 10:03 AM ______________________________________________________________________   Discussed status with Dr. Riley Kill on 10/21/20  at 10:03 AM  and received approval for admission today.  Admission Coordinator:  Stephania Fragmin, PT, time 10:03 AM Dorna Bloom 10/21/20    Assessment/Plan: Diagnosis: left dorsal medullary infarct 1. Does the need for close, 24 hr/day Medical supervision in concert with the patient's rehab needs make it unreasonable for this patient to be served in a less intensive setting? Yes 2. Co-Morbidities requiring supervision/potential complications: HTN, BPH 3. Due to bladder management, bowel management, safety, skin/wound care, disease management, medication  administration, pain management and patient education, does the patient require 24 hr/day rehab nursing? Yes 4. Does the patient require coordinated care of a physician, rehab nurse, PT, OT, and SLP to address physical and functional deficits in the context of the above medical diagnosis(es)? Yes Addressing deficits in the following areas: balance, endurance, locomotion, strength, transferring, bowel/bladder control, bathing, dressing, feeding, grooming, toileting, cognition and psychosocial support 5. Can the patient actively participate in an intensive therapy program of at least 3 hrs of therapy 5 days a week? Yes 6. The potential for patient to make measurable gains while on inpatient rehab is excellent 7. Anticipated functional outcomes upon discharge from inpatient rehab: modified independent and supervision PT, modified independent and supervision OT, modified independent SLP 8. Estimated rehab length of stay to reach the above functional goals is: 10-14 days 9. Anticipated discharge destination: Home 10. Overall Rehab/Functional Prognosis: excellent   MD Signature: Ranelle Oyster, MD, [facility] Physical Medicine [facility] Rehabilitation 10/21/2020

## 2020-10-20 NOTE — H&P (Signed)
Physical Medicine and Rehabilitation Admission H&P     DTO:[id number] Dennis Strickland is a 65 year old right-handed male with history of BPH, hyperlipidemia, hypertension, prediabetes, presumed TIA September 2021 maintained on aspirin 81 mg daily.  Per chart review lives with spouse, daughter and grandson.  Multilevel home.  Independent prior to admission.  Patient still works full-time.  Presented 10/17/2020 with left-sided numbness incoordination and headache of acute onset.  Noted systolic blood pressure 180s to 190s.  CT/MRI showed no acute intracranial abnormality.  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  CT angiogram head and neck negative CTA for emergent large vessel occlusion.  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  Multifocal severe left P2 stenosis.  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  Echocardiogram with ejection fraction of 60 to 65% no wall motion abnormalities grade 1 diastolic dysfunction.  Patient did not receive tPA.  Follow-up MRI showed new focus of abnormal diffusion restriction within the left dorsal medulla oblongata consistent with acute to subacute infarction.  Admission chemistries unremarkable except glucose 105 BUN 24 hemoglobin A1c 5.8.  Currently maintained on aspirin 325 mg daily and Plavix 75 mg daily for CVA prophylaxis x3 months.  Subcutaneous Lovenox for DVT prophylaxis.  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  Intracranial atherosclerotic disease.  Therapy evaluations completed due to patient decreased functional ability left side numbness recommendations physical medicine rehab consult patient was admitted for a comprehensive rehab program.  Review of Systems  Constitutional: Negative for chills and fever.  HENT: Negative for hearing loss.   Eyes:  Positive for double vision. Negative for blurred vision.  Respiratory: Negative for cough and shortness of breath.   Cardiovascular: Negative for chest pain, palpitations and leg swelling.  Gastrointestinal: Positive for constipation. Negative for heartburn, nausea and vomiting.  Genitourinary: Negative for dysuria, flank pain and hematuria.  Musculoskeletal: Positive for joint pain and myalgias.  Skin: Negative for rash.  Neurological: Positive for dizziness, sensory change, weakness and headaches.  All other systems reviewed and are negative.  Past Medical History:  Diagnosis Date  . BPH (benign prostatic hyperplasia)   . Dyslipidemia   . Hypertension   . Impaired glucose tolerance   . TIA (transient ischemic attack) 02/2020   Past Surgical History:  Procedure Laterality Date  . TOTAL KNEE ARTHROPLASTY     Family History  Problem Relation Age of Onset  . Hypertension Mother   . CVA Sister   . Lupus Sister   . CVA Brother 35   Social History:  reports that he has never smoked. He has never used smokeless tobacco. He reports that he does not drink alcohol and does not use drugs. Allergies: No Known Allergies Medications Prior to Admission  Medication Sig Dispense Refill  . acetaminophen (TYLENOL) 650 MG CR tablet Take 1,300 mg by mouth in the morning and at bedtime.    Marland Kitchen aspirin EC 81 MG tablet Take 162 mg by mouth in the morning and at bedtime. Swallow whole.    . Calcium Carb-Cholecalciferol 500-400 MG-UNIT TABS Take 1 tablet by mouth daily.    . cholecalciferol (VITAMIN D) 25 MCG (1000 UNIT) tablet Take 1,000 Units by mouth daily.    . finasteride (PROSCAR) 5 MG tablet Take 5 mg by mouth daily.    Marland Kitchen gabapentin (NEURONTIN) 300 MG capsule Take 300 mg by  mouth daily.    Marland Kitchen lisinopril (ZESTRIL) 20 MG tablet Take 20 mg by mouth 2 (two) times daily.    . metFORMIN (GLUCOPHAGE) 500 MG tablet Take 500 mg by mouth 2 (two) times daily.    . rosuvastatin (CRESTOR) 10 MG tablet Take 10  mg by mouth at bedtime.    . tamsulosin (FLOMAX) 0.4 MG CAPS capsule Take 0.8 mg by mouth daily.    . vitamin C (ASCORBIC ACID) 500 MG tablet Take 500 mg by mouth daily.      Drug Regimen Review  Drug regimen was reviewed and remains appropriate with no significant issues identified  Home: Home Living Family/patient expects to be discharged to:: Private residence Living Arrangements: Children,Spouse/significant other Available Help at Discharge: Family,Available 24 hours/day Type of Home: House Home Access: Stairs to enter [employer] of Steps: 1 Home Layout: Multi-level Alternate Level Stairs-Number of Steps: 4 Alternate Level Stairs-Rails: Left Bathroom Shower/Tub: Walk-in shower Home Equipment: Bedside commode,Shower seat - built in,Walker - 2 wheels,Cane - single point Additional Comments: Lives with wife   Functional History: Prior Function Level of Independence: Independent 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  Functional Status:  Mobility: Bed Mobility Overal bed mobility: Needs Assistance Bed Mobility: Supine to Sit,Sit to Supine Supine to sit: Min assist Sit to supine: Min assist General bed mobility comments: assist for balance upon moving to EOB and assist to control descent when moving shoulders back toward the bed Transfers Overall transfer level: Needs assistance Equipment used: None Transfers: Sit to/from Stand Sit to Stand: Min assist General transfer comment: MinA to rise and steady x 2, pt holding onto L bed rail, utilizing wide BOS for comfort.      ADL: ADL Overall ADL's : Needs assistance/impaired Eating/Feeding: Modified independent,Bed level Grooming: Wash/dry hands,Wash/dry face,Oral care,Moderate assistance,Sitting Grooming Details (indicate cue type and reason): mod A for sitting balance EOB Upper Body Bathing:  Moderate assistance,Sitting Upper Body Bathing Details (indicate cue type and reason): mod A for sitting balance EOB Lower Body Bathing: Maximal assistance,Bed level,Sitting/lateral leans Upper Body Dressing : Sitting,Maximal assistance Upper Body Dressing Details (indicate cue type and reason): max A for sitting balance EOB Lower Body Dressing: Maximal assistance,Sitting/lateral leans,Bed level Lower Body Dressing Details (indicate cue type and reason): able to doff Rt sock with mod A for balance, but unable to don it due to significant Lt lateral lean Toilet Transfer: Total assistance Toilet Transfer Details (indicate cue type and reason): unable Toileting- Clothing Manipulation and Hygiene: Total assistance,Sitting/lateral lean,Bed level Functional mobility during ADLs: Maximal assistance,Moderate assistance (bed mobility)  Cognition: Cognition Overall Cognitive Status: Within Functional Limits for tasks assessed Orientation Level: Oriented X4 Cognition Arousal/Alertness: Awake/alert Behavior During Therapy: WFL for tasks assessed/performed Overall Cognitive Status: Within Functional Limits for tasks assessed General Comments: Descriptive and aware of deficits  Physical Exam: Blood pressure 140/86, pulse 77, temperature 98.6 F (37 C), temperature source Oral, resp. rate (!) 22, height 6\' 1"  (1.854 m), weight 120.2 kg, SpO2 97 %. Physical Exam Constitutional:      General: He is not in acute distress.    Appearance: He is obese.  HENT:     Head: Normocephalic and atraumatic.     Nose: Nose normal.     Mouth/Throat:     Mouth: Mucous membranes are moist.  Eyes:     Extraocular Movements: Extraocular movements intact.     Comments: EOMI  are grossly intact but he c/o diplopia. Left eyeglass lens taped  Cardiovascular:     Rate and Rhythm: Normal rate and regular rhythm.     Heart sounds: No murmur heard. No gallop.   Pulmonary:     Effort: Pulmonary effort is normal. No  respiratory distress.     Breath sounds: No wheezing.  Abdominal:     General: Bowel sounds are normal. There is no distension.     Tenderness: There is no abdominal tenderness.  Musculoskeletal:        General: No swelling.     Cervical back: Normal range of motion.     Right lower leg: No edema.     Left lower leg: No edema.     Comments: Old TKA scar right knee, full ROM  Skin:    General: Skin is warm.     Coloration: Skin is not jaundiced or pale.  Neurological:     Comments: Alert and oriented x 3. Normal insight and awareness. Intact Memory. Normal language and speech. Diplopia, dysphonia. Left hemi-facial sensory loss. Decreased depth perception with FTN, HTS. Strength nearly 5/5 in UE and LE bilaterally. Sensed pain and light touch in all 4 limbs.   Psychiatric:        Mood and Affect: Mood normal.        Behavior: Behavior normal.     Results for orders placed or performed during the hospital encounter of 10/17/20 (from the past 48 hour(s))  Glucose, capillary     Status: Abnormal   Collection Time: 10/18/20  4:02 PM  Result Value Ref Range   Glucose-Capillary 133 (H) 70 - 99 mg/dL    Comment: Glucose reference range applies only to samples taken after fasting for at least 8 hours.   Comment 1 Notify RN   Glucose, capillary     Status: Abnormal   Collection Time: 10/18/20  9:13 PM  Result Value Ref Range   Glucose-Capillary 104 (H) 70 - 99 mg/dL    Comment: Glucose reference range applies only to samples taken after fasting for at least 8 hours.  Glucose, capillary     Status: None   Collection Time: 10/19/20  6:09 AM  Result Value Ref Range   Glucose-Capillary 98 70 - 99 mg/dL    Comment: Glucose reference range applies only to samples taken after fasting for at least 8 hours.  Glucose, capillary     Status: Abnormal   Collection Time: 10/19/20 11:02 AM  Result Value Ref Range   Glucose-Capillary 100 (H) 70 - 99 mg/dL    Comment: Glucose reference range applies  only to samples taken after fasting for at least 8 hours.   Comment 1 Notify RN   Glucose, capillary     Status: Abnormal   Collection Time: 10/19/20  3:58 PM  Result Value Ref Range   Glucose-Capillary 101 (H) 70 - 99 mg/dL    Comment: Glucose reference range applies only to samples taken after fasting for at least 8 hours.  Glucose, capillary     Status: Abnormal   Collection Time: 10/19/20  9:05 PM  Result Value Ref Range   Glucose-Capillary 147 (H) 70 - 99 mg/dL    Comment: Glucose reference range applies only to samples taken after fasting for at least 8 hours.  Glucose, capillary     Status: Abnormal   Collection Time: 10/20/20  6:05 AM  Result Value Ref Range   Glucose-Capillary 111 (H) 70 - 99 mg/dL  Comment: Glucose reference range applies only to samples taken after fasting for at least 8 hours.   ECHOCARDIOGRAM COMPLETE  Result Date: 10/18/2020    ECHOCARDIOGRAM REPORT   Patient Name:   Caryl BisJeffery Halvorsen Sr. Date of Exam: 10/18/2020 Medical Rec #:  [id number]        Height:       73.0 in Accession #:    [id number]       Weight:       265.0 lb Date of Birth:  [birth year]        BSA:          2.425 m Patient Age:    65 years         BP:           117/52 mmHg Patient Gender: M                HR:           67 bpm. Exam Location:  Inpatient Procedure: 2D Echo Indications:    TIA  History:        Patient has no prior history of Echocardiogram examinations.                 Risk Factors:Hypertension and Dyslipidemia.  Sonographer:    Delcie RochLauren Pennington Referring Phys: [id number] JENNIFER YATES IMPRESSIONS  1. Left ventricular ejection fraction, by estimation, is 60 to 65%. The left ventricle has normal function. The left ventricle has no regional wall motion abnormalities. There is mild left ventricular hypertrophy. Left ventricular diastolic parameters are consistent with Grade I diastolic dysfunction (impaired relaxation).  2. Right ventricular systolic function is normal. The right ventricular size is  normal.  3. The mitral valve is normal in structure. Trivial mitral valve regurgitation. No evidence of mitral stenosis.  4. The aortic valve is tricuspid. There is mild thickening of the aortic valve. Aortic valve regurgitation is not visualized. No aortic stenosis is present. Conclusion(s)/Recommendation(s): No intracardiac source of embolism detected on this transthoracic study. A transesophageal echocardiogram is recommended to exclude cardiac source of embolism if clinically indicated. FINDINGS  Left Ventricle: Left ventricular ejection fraction, by estimation, is 60 to 65%. The left ventricle has normal function. The left ventricle has no regional wall motion abnormalities. The left ventricular internal cavity size was normal in size. There is  mild left ventricular hypertrophy. Left ventricular diastolic parameters are consistent with Grade I diastolic dysfunction (impaired relaxation). Right Ventricle: The right ventricular size is normal. No increase in right ventricular wall thickness. Right ventricular systolic function is normal. Left Atrium: Left atrial size was normal in size. Right Atrium: Right atrial size was normal in size. Pericardium: There is no evidence of pericardial effusion. Mitral Valve: The mitral valve is normal in structure. Trivial mitral valve regurgitation. No evidence of mitral valve stenosis. Tricuspid Valve: The tricuspid valve is normal in structure. Tricuspid valve regurgitation is trivial. No evidence of tricuspid stenosis. Aortic Valve: The aortic valve is tricuspid. There is mild thickening of the aortic valve. Aortic valve regurgitation is not visualized. No aortic stenosis is present. Pulmonic Valve: The pulmonic valve was normal in structure. Pulmonic valve regurgitation is trivial. No evidence of pulmonic stenosis. Aorta: The aortic root is normal in size and structure. Venous: The inferior vena cava was not well visualized. IAS/Shunts: The interatrial septum appears to be  lipomatous. No atrial level shunt detected by color flow Doppler.  LEFT VENTRICLE PLAX 2D LVIDd:         4.90  cm  Diastology LVIDs:         3.00 cm  LV e' lateral:   7.51 cm/s LV PW:         1.10 cm  LV E/e' lateral: 12.6 LV IVS:        0.90 cm LVOT diam:     2.40 cm LV SV:         116 LV SV Index:   48 LVOT Area:     4.52 cm  RIGHT VENTRICLE TAPSE (M-mode): 2.2 cm LEFT ATRIUM             Index       RIGHT ATRIUM           Index LA diam:        4.20 cm 1.73 cm/m  RA Area:     12.30 cm LA Vol (A2C):   77.6 ml 32.00 ml/m RA Volume:   25.30 ml  10.43 ml/m LA Vol (A4C):   54.6 ml 22.52 ml/m LA Biplane Vol: 65.3 ml 26.93 ml/m  AORTIC VALVE LVOT Vmax:   124.00 cm/s LVOT Vmean:  80.200 cm/s LVOT VTI:    0.256 m  AORTA Ao Root diam: 3.60 cm Ao Asc diam:  3.40 cm MITRAL VALVE                TRICUSPID VALVE MV Area (PHT): 3.08 cm     TR Peak grad:   21.3 mmHg MV Decel Time: 246 msec     TR Vmax:        231.00 cm/s MV E velocity: 94.30 cm/s MV A velocity: 118.00 cm/s  SHUNTS MV E/A ratio:  0.80         Systemic VTI:  0.26 m                             Systemic Diam: 2.40 cm Weston Brass MD Electronically signed by Weston Brass MD Signature Date/Time: 10/18/2020/5:01:51 PM    Final        Medical Problem List and Plan: 1.  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 He is working through [employer] 2.  Antithrombotics: -DVT/anticoagulation: Lovenox  -antiplatelet therapy: Aspirin 325 mg daily and Plavix 75 mg daily x3 months then Plavix alone 3. Pain Management: Neurontin 300 mg daily 4. Mood: Provide emotional support  -antipsychotic agents: N/A 5. Neuropsych: This patient is capable of making decisions on his own behalf. 6. Skin/Wound Care: Routine skin  checks 7. Fluids/Electrolytes/Nutrition: pt has good appetite  -check labs in AM 8.  Hyperlipidemia.  Crestor 9.  Prediabetes.  Hemoglobin A1c 5.8.  SSI 10.  BPH.  Proscar 5 mg daily  -monitor voiding patterns     Charlton Amor, PA-C 10/20/2020

## 2020-10-20 NOTE — Sedation Documentation (Signed)
Patient transported to 5C17. Met by lydia-RN at the bedside. Report given. Groin site assessed. Clean, dry and intact. No drainage noted from dressing. Soft to palpation, no hematoma noted. Right wrist assessed where TR band was just removed 1 hour prior. Site is intact, however there was minimal drainage upon removal of band. Pressure dressing was placed and site marked to assess for further drainage. Upon 1 hour post removal assessment, no further drainage noted, pulses intact, no hematoma noted. Drainage present from when dressing placed initially.

## 2020-10-20 NOTE — Sedation Documentation (Signed)
TR BAND 13 cc @ 1036

## 2020-10-20 NOTE — Progress Notes (Signed)
SLP Cancellation Note  Patient Details Name: Dennis Trompeter Sr. MRN: [id number] DOB: 1956-03-27   Cancelled treatment:       Reason Eval/Treat Not Completed: Patient at procedure or test/unavailable Ferdinand Lango MA, CCC-SLP    Ardell Makarewicz Meryl 10/20/2020, 10:54 AM

## 2020-10-20 NOTE — Progress Notes (Signed)
PT Cancellation Note  Patient Details Name: Dennis Mccauley Sr. MRN: [id number] DOB: [birth year]   Cancelled Treatment:    Reason Eval/Treat Not Completed: Patient at procedure or test/unavailable   Will follow up later today as time allows;  Otherwise, will follow up for PT tomorrow;   Thank you,  Dennis Strickland, PT  Acute Rehabilitation Services Pager [phone] Office [phone]     Dennis Strickland 10/20/2020, 12:05 PM

## 2020-10-20 NOTE — Progress Notes (Shared)
PMR Admission Coordinator Pre-Admission Assessment   Patient: Dennis BisJeffery Marcoux Sr. is an 65 y.o., male MRN: [id number] DOB: [birth year] Height: 6\' 1"  (185.4 cm) Weight: 120.2 kg   Insurance Information HMO: ***    PPO: ***     PCP: ***     IPA: ***     80/20: ***     OTHER: *** PRIMARY: ***      Policy#: ***      Subscriber: *** CM Name: ***      Phone#: ***     Fax#: *** Pre-Cert#: ***      Employer: *** Benefits:  Phone #: ***     Name: *** Eff. Date: ***     Deduct: ***      Out of Pocket Max: ***      Life Max: *** CIR: ***      SNF: *** Outpatient: ***     Co-Pay: *** Home Health: ***      Co-Pay: *** DME: ***     Co-Pay: *** Providers: *** SECONDARY:       Policy#:      Phone#:    Financial Counselor:       Phone#:    The "Data Collection Information Summary" for patients in Inpatient Rehabilitation Facilities with attached "Privacy Act Statement-Health Care Records" was provided and verbally reviewed with: Patient and Family   Emergency Contact Information         Contact Information     Name Relation Home Work [employer]    [place], [place][name]" Spouse     [phone]         Current Medical History  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 is a 65 y.o. right-handed male with history of BPH hyperlipidemia hypertension, presumed TIA September 2021 maintained on aspirin 81 mg daily. Presented 10/17/2020 with left-sided numbness incoordination and headache of acute onset.  Noted systolic blood pressures 180s to 190s.  CT/MRI showed no acute intracranial abnormality.  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  CT angiogram of head and neck negative CTA for emergent large vessel occlusion.  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  Multifocal severe left P2 stenosis.  Occlusion of the left V4 segment  just as it courses into the cranial vault.  Echocardiogram with ejection fraction of 60 to 65% no wall motion abnormalities grade 1 diastolic dysfunction.  Patient did not receive tPA.  Follow-up MRI showed new focus of abnormal diffusion restriction within the left dorsal medulla oblongata consistent with acute to subacute infarction.  Admission chemistries unremarkable aside glucose 105 BUN 24, hemoglobin A1c 5.8.  Currently maintained on aspirin 325 mg daily and Plavix 75 mg daily for CVA prophylaxis.  Subcutaneous Lovenox for DVT prophylaxis.  Awaiting plan for cerebral angiogram.  Therapy evaluations completed due to patient decreased functional mobility left side numbness recommendations of physical medicine rehab consult.   Complete NIHSS TOTAL: 1   [facility] been [facility] by the rehabilitation admission coordinator and physician.   Past Medical History      Past Medical History:  Diagnosis Date  . BPH (benign prostatic hyperplasia)    . Dyslipidemia    . Hypertension    . Impaired glucose tolerance    . TIA (transient ischemic attack) 02/2020      Family History   family  history includes CVA in his sister; CVA (age of onset: 72) in his brother; Hypertension in his mother; Lupus in his sister.   Prior Rehab/Hospitalizations Has the patient had prior rehab or hospitalizations prior to admission? Yes   Has the patient had major surgery during 100 days prior to admission? Yes              Current Medications   Current Facility-Administered Medications:  .   stroke: mapping our early stages of recovery book, , Does not apply, Once, Jonah Blue, MD .  acetaminophen (TYLENOL) tablet 650 mg, 650 mg, Oral, Q4H PRN **OR** acetaminophen (TYLENOL) 160 MG/5ML solution 650 mg, 650 mg, Per Tube, Q4H PRN **OR** acetaminophen (TYLENOL) suppository 650 mg, 650 mg, Rectal, Q4H PRN, Jonah Blue, MD .  acetaminophen (TYLENOL) tablet 500 mg, 500 mg, Oral,  BID, Jonah Blue, MD, 500 mg at 10/19/20 2054 .  [DISCONTINUED] aspirin suppository 300 mg, 300 mg, Rectal, Daily **OR** aspirin tablet 325 mg, 325 mg, Oral, Daily, Jonah Blue, MD, 325 mg at 10/19/20 1020 .  clopidogrel (PLAVIX) tablet 75 mg, 75 mg, Oral, Daily, Marvel Plan, MD, 75 mg at 10/19/20 1021 .  enoxaparin (LOVENOX) injection 40 mg, 40 mg, Subcutaneous, Q24H, Jonah Blue, MD, 40 mg at 10/18/20 1840 .  feeding supplement (ENSURE ENLIVE / ENSURE PLUS) liquid 237 mL, 237 mL, Oral, BID BM, Vann, Jessica U, DO .  fentaNYL (SUBLIMAZE) 100 MCG/2ML injection, , , ,  .  fentaNYL (SUBLIMAZE) injection, , Intravenous, PRN, de Melchor Amour, Jerilynn Mages, MD, 25 mcg at 10/20/20 [phone] .  finasteride (PROSCAR) tablet 5 mg, 5 mg, Oral, Daily, Jonah Blue, MD, 5 mg at 10/19/20 1021 .  gabapentin (NEURONTIN) capsule 300 mg, 300 mg, Oral, Daily, Jonah Blue, MD, 300 mg at 10/19/20 1020 .  heparin sodium (porcine) 1000 UNIT/ML injection, , , ,  .  heparin sodium (porcine) injection, , , PRN, de Melchor Amour, [place], MD, 5,000 Units at 10/20/20 1018 .  hydrALAZINE (APRESOLINE) 20 MG/ML injection, , , ,  .  hydrALAZINE (APRESOLINE) injection 5 mg, 5 mg, Intravenous, Q4H PRN, Jonah Blue, MD, 5 mg at 10/17/20 1746 .  hydrALAZINE (APRESOLINE) injection, , , PRN, de Melchor Amour, [place], MD, 5 mg at 10/20/20 [phone] .  insulin aspart (novoLOG) injection 0-9 Units, 0-9 Units, Subcutaneous, TID WC, Jonah Blue, MD, 1 Units at 10/18/20 1838 .  iohexol (OMNIPAQUE) 240 MG/ML injection 150 mL, 150 mL, Intravenous, Once PRN, de Melchor Amour, Jerilynn Mages, MD .  iohexol (OMNIPAQUE) 240 MG/ML injection, , , ,  .  lidocaine (XYLOCAINE) 1 % (with pres) injection, , , ,  .  lidocaine (XYLOCAINE) 1 % (with pres) injection, , Infiltration, PRN, de Melchor Amour, Jerilynn Mages, MD, 10 mL at 10/20/20 0906 .  midazolam (VERSED) 2 MG/2ML injection, , , ,  .  midazolam (VERSED) injection, ,  Intravenous, PRN, de Melchor Amour, Jerilynn Mages, MD, 0.5 mg at 10/20/20 0954 .  multivitamin with minerals tablet 1 tablet, 1 tablet, Oral, Daily, Vann, Jessica U, DO, 1 tablet at 10/19/20 1752 .  nitroGLYCERIN 1 mg/10 mL (100 mcg/mL) - IR/CATH LAB, , , PRN, de Melchor Amour, [place], MD, 300 mcg at 10/20/20 1018 .  nitroGLYCERIN 100 mcg/mL intra-arterial injection, , , ,  .  rosuvastatin (CRESTOR) tablet 40 mg, 40 mg, Oral, QHS, Marvel Plan, MD, 40 mg at 10/19/20 2054 .  senna-docusate (Senokot-S) tablet 1 tablet, 1 tablet, Oral, QHS PRN, Jonah Blue, MD .  verapamil (ISOPTIN) 2.5 MG/ML injection, , , ,  .  verapamil (ISOPTIN) injection, , Intravenous, PRN, de Melchor Amour, [place], MD, 5 mg at 10/20/20 1018   Patients Current Diet:     Diet Order                      Diet NPO time specified  Diet effective now                      Precautions / Restrictions Precautions Precautions: Fall,Other (comment) Precaution Comments: Diplopia Restrictions Weight Bearing Restrictions: No    Has the patient had 2 or more falls or a fall with injury in the past year? 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 Independent   Indoor Mobility: Did the patient need assistance with walking from room to room (with or without device)? Independent   Stairs: Did the patient need assistance with internal or external stairs (with or without device)? Independent   Functional Cognition: Did the patient need help planning regular tasks such as shopping or remembering to take medications? Independent   Home Assistive Devices / Equipment Home Equipment: Bedside commode,Shower seat - built in,Walker - 2 wheels,Cane - single point   Prior Device Use: Indicate devices/aids used by the patient prior to  current illness, exacerbation or injury? cane following TKA   Current Functional Level Cognition   Overall Cognitive Status: Within Functional Limits for tasks assessed Orientation Level: Oriented X4 General Comments: Descriptive and aware of deficits    Extremity Assessment (includes Sensation/Coordination)   Upper Extremity Assessment: LUE deficits/detail,RUE deficits/detail RUE Deficits / Details: very mild dysmetria RUE Coordination: decreased fine motor,decreased gross motor LUE Deficits / Details: ataxia noted LUE Sensation: decreased proprioception,decreased light touch LUE Coordination: decreased fine motor,decreased gross motor  Lower Extremity Assessment: Defer to PT evaluation RLE Deficits / Details: Strength 5/5 LLE Deficits / Details: Strength 5/5     ADLs   Overall ADL's : Needs assistance/impaired Eating/Feeding: Modified independent,Bed level Grooming: Wash/dry hands,Wash/dry face,Oral care,Moderate assistance,Sitting Grooming Details (indicate cue type and reason): mod A for sitting balance EOB Upper Body Bathing: Moderate assistance,Sitting Upper Body Bathing Details (indicate cue type and reason): mod A for sitting balance EOB Lower Body Bathing: Maximal assistance,Bed level,Sitting/lateral leans Upper Body Dressing : Sitting,Maximal assistance Upper Body Dressing Details (indicate cue type and reason): max A for sitting balance EOB Lower Body Dressing: Maximal assistance,Sitting/lateral leans,Bed level Lower Body Dressing Details (indicate cue type and reason): able to doff Rt sock with mod A for balance, but unable to don it due to significant Lt lateral lean Toilet Transfer: Total assistance Toilet Transfer Details (indicate cue type and reason): unable Toileting- Clothing Manipulation and Hygiene: Total assistance,Sitting/lateral lean,Bed level Functional mobility during ADLs: Maximal assistance,Moderate assistance (bed mobility)     Mobility   Overal bed  mobility: Needs Assistance Bed Mobility: Supine to Sit,Sit to Supine Supine to sit: Min assist Sit to supine: Min assist General bed mobility comments: assist for balance upon moving to EOB and assist to control descent when moving shoulders back toward the bed     Transfers   Overall transfer level: Needs assistance Equipment used: None Transfers: Sit to/from Stand Sit to Stand: Min assist General transfer comment: MinA to rise and steady x 2, pt holding onto L bed rail, utilizing wide BOS for comfort.  Ambulation / Gait / Stairs / Clinical biochemist / Balance Dynamic Sitting Balance Sitting balance - Comments: Pt requires min guard - max a for sitting balance with heavy lt lateral lean Balance Overall balance assessment: Needs assistance Sitting-balance support: Feet supported Sitting balance-Leahy Scale: Poor Sitting balance - Comments: Pt requires min guard - max a for sitting balance with heavy lt lateral lean Postural control: Left lateral lean Standing balance support: Single extremity supported,During functional activity Standing balance-Leahy Scale: Poor Standing balance comment: reliant on single UE support, wide BOS     Special needs/care consideration Diabetic management yes    Previous Home Environment (from acute therapy documentation) Living Arrangements: Children,Spouse/significant other Available Help at Discharge: Family,Available 24 hours/day Type of Home: House Home Layout: Multi-level Alternate Level Stairs-Rails: Left Alternate Level Stairs-Number of Steps: 4 Home Access: Stairs to enter Secretary/administrator of Steps: 1 Bathroom Shower/Tub: Radiographer, therapeutic Comments: Lives with wife   Discharge Living Setting Plans for Discharge Living Setting: Lives with (comment) (wife and daughter) Type of Home at Discharge: House Discharge Home Layout: Multi-level,Bed/bath upstairs,Able to live on main level with  bedroom/bathroom,Laundry or work area in basement (4 steps to bed/bath, full flight to basement) Alternate Level Stairs-Rails: Left Alternate Level Stairs-Number of Steps: 4 steps to bedroom/bathroom, 14 to basement Discharge Home Access: Stairs to enter Entrance Stairs-Rails: None Entrance Stairs-Number of Steps: 1 Discharge Bathroom Shower/Tub: Walk-in shower Discharge Bathroom Toilet: Handicapped height Discharge Bathroom Accessibility: Yes How Accessible: Accessible via walker Does the patient have any problems obtaining your medications?: No   Social/Family/Support Systems Patient Roles: Spouse Anticipated Caregiver: Sava Proby (spouse) Anticipated Caregiver's Contact Information: [phone] Ability/Limitations of Caregiver: min assist Caregiver Availability: 24/7 Discharge Plan Discussed with Primary Caregiver: Yes Is Caregiver In Agreement with Plan?: Yes Does Caregiver/Family have Issues with Lodging/Transportation while Pt is in Rehab?: No   Goals Patient/Family Goal for Rehab: PT/OT supervision, SLP mod I Expected length of stay: 12-16 days Pt/Family Agrees to Admission and willing to participate: Yes Program Orientation Provided & Reviewed with Pt/Caregiver Including Roles  & Responsibilities: Yes  Barriers to Discharge: Insurance for SNF coverage   Decrease burden of Care through IP rehab admission: n/a   Possible need for SNF placement upon discharge: Not anticipated. Pt with excellent family support.    Patient Condition: I have reviewed medical records from [facility], spoken with CM, and spouse. I discussed via phone for inpatient rehabilitation assessment.  Patient will benefit from ongoing PT, OT and SLP, can actively participate in 3 hours of therapy a day 5 days of the week, and can make measurable gains during the admission.  Patient will also benefit from the coordinated team approach during an Inpatient Acute Rehabilitation admission.  The patient  will receive intensive therapy as well as Rehabilitation physician, nursing, social worker, and care management interventions.  Due to safety, disease management, medication administration, pain management and patient education the patient requires 24 hour a day rehabilitation nursing.  The patient is currently min assist with mobility and max assist with basic ADLs.  Discharge setting and therapy post discharge at home with home health is anticipated.  Patient has agreed to participate in the Acute Inpatient Rehabilitation Program and will admit pending insurance authorization ***.   Preadmission Screen Completed By:  Stephania Fragmin, PT, DPT 10/20/2020 10:47 AM

## 2020-10-20 NOTE — Progress Notes (Signed)
STROKE TEAM PROGRESS NOTE   SUBJECTIVE (INTERVAL HISTORY) His wife is at the bedside listening on the phone. Pt diagnostic cerebral catheter angiogram performed today with confirmed left vertebral artery occlusion.  Patient continues to have diplopia and ataxia in both voice.  Vital signs are stable.  OBJECTIVE Temp:  [98.4 F (36.9 C)-99 F (37.2 C)] 98.6 F (37 C) (05/09 1558) Pulse Rate:  [62-89] 89 (05/09 1558) Cardiac Rhythm: Normal sinus rhythm (05/09 1245) Resp:  [10-22] 17 (05/09 1558) BP: (127-185)/(79-133) 143/84 (05/09 1558) SpO2:  [95 %-100 %] 97 % (05/09 1558)  Recent Labs  Lab 10/19/20 1558 10/19/20 2105 10/20/20 0605 10/20/20 1311 10/20/20 1556  GLUCAP 101* 147* 111* 100* 105*   Recent Labs  Lab 10/17/20 0218 10/17/20 0227  NA 141 142  K 4.1 4.0  CL 106 108  CO2 26  --   GLUCOSE 105* 107*  BUN 24* 24*  CREATININE 1.17 1.10  CALCIUM 9.4  --    Recent Labs  Lab 10/17/20 0218  AST 27  ALT 28  ALKPHOS 75  BILITOT 0.4  PROT 7.0  ALBUMIN 4.2   Recent Labs  Lab 10/17/20 0218 10/17/20 0227  WBC 7.0  --   NEUTROABS 4.4  --   HGB 13.9 13.9  HCT 43.9 41.0  MCV 88.9  --   PLT 151  --    No results for input(s): CKTOTAL, CKMB, CKMBINDEX, TROPONINI in the last 168 hours. No results for input(s): LABPROT, INR in the last 72 hours. No results for input(s): COLORURINE, LABSPEC, Pence, GLUCOSEU, HGBUR, BILIRUBINUR, KETONESUR, PROTEINUR, UROBILINOGEN, NITRITE, LEUKOCYTESUR in the last 72 hours.  Invalid input(s): APPERANCEUR     Component Value Date/Time   CHOL 156 10/17/2020 1017   TRIG 189 (H) 10/17/2020 1017   HDL 38 (L) 10/17/2020 1017   CHOLHDL 4.1 10/17/2020 1017   VLDL 38 10/17/2020 1017   LDLCALC 80 10/17/2020 1017   Lab Results  Component Value Date   HGBA1C 5.8 (H) 10/17/2020   No results found for: LABOPIA, COCAINSCRNUR, LABBENZ, AMPHETMU, THCU, LABBARB  No results for input(s): ETH in the last 168 hours.  I have personally  reviewed the radiological images below and agree with the radiology interpretations.  MR BRAIN WO CONTRAST  Result Date: 10/18/2020 CLINICAL DATA:  Stroke follow-up EXAM: MRI HEAD WITHOUT CONTRAST TECHNIQUE: Multiplanar, multiecho pulse sequences of the brain and surrounding structures were obtained without intravenous contrast. COMPARISON:  10/17/2020 brain MRI FINDINGS: Brain: There is a new focus of abnormal diffusion restriction within the left dorsal medulla oblongata (5:56). No other diffusion abnormality. No acute hemorrhage. No mass effect. There is multifocal hyperintense T2-weighted signal within the white matter. Parenchymal volume and CSF spaces are normal. The midline structures are normal. Vascular: Major flow voids are preserved. Skull and upper cervical spine: Normal calvarium and skull base. Visualized upper cervical spine and soft tissues are normal. Sinuses/Orbits:No paranasal sinus fluid levels or advanced mucosal thickening. No mastoid or middle ear effusion. Normal orbits. IMPRESSION: New focus of abnormal diffusion restriction within the left dorsal medulla oblongata, consistent with acute to subacute infarct. Electronically Signed   By: Ulyses Jarred M.D.   On: 10/18/2020 01:01   MR BRAIN WO CONTRAST  Result Date: 10/17/2020 CLINICAL DATA:  Initial evaluation for acute stroke. EXAM: MRI HEAD WITHOUT CONTRAST TECHNIQUE: Multiplanar, multiecho pulse sequences of the brain and surrounding structures were obtained without intravenous contrast. COMPARISON:  Prior studies from earlier the same day as well as previous  MRI from 03/05/2020. FINDINGS: Brain: Examination degraded by motion artifact. Generalized age-related cerebral atrophy. Patchy T2/FLAIR hyperintensity within the periventricular and deep white matter both cerebral hemispheres most consistent with chronic small vessel ischemic disease, moderate in nature. Superimposed remote lacunar infarct present at the right thalamus. No  abnormal foci of restricted diffusion to suggest acute or subacute ischemia. Gray-white matter differentiation maintained. No encephalomalacia to suggest chronic cortical infarction. No evidence for acute or chronic intracranial hemorrhage. No mass lesion, midline shift or mass effect. No hydrocephalus or extra-axial fluid collection. Pituitary gland and suprasellar region within normal limits. Midline structures intact. Vascular: Absent flow void within the left V4 segment, consistent with previously identified occlusion. Major intracranial vascular flow voids are otherwise maintained. Skull and upper cervical spine: Craniocervical junction within normal limits. Upper cervical spine normal. Bone marrow signal intensity within normal limits. No focal marrow replacing lesion. No scalp soft tissue abnormality. Sinuses/Orbits: Globes orbital soft tissues demonstrate no acute finding. Mild scattered mucosal thickening noted within the ethmoidal air cells and maxillary sinuses. Paranasal sinuses are otherwise clear. No mastoid effusion. Inner ear structures grossly normal. Other: None. IMPRESSION: 1. No acute intracranial abnormality. 2. 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 Overall, appearance is similar as compared to previous MRI from 03/05/2020. Electronically Signed   By: Jeannine Boga M.D.   On: 10/17/2020 04:23   ECHOCARDIOGRAM COMPLETE  Result Date: 10/18/2020    ECHOCARDIOGRAM REPORT   Patient Name:   Sriman Tally Sr. Date of Exam: 10/18/2020 Medical Rec #:  [id number]        Height:       73.0 in Accession #:    [id number]       Weight:       265.0 lb Date of Birth:  [birth year]        BSA:          2.425 m Patient Age:    65 years         BP:           117/52 mmHg Patient Gender: M                HR:           67 bpm. Exam Location:  Inpatient Procedure: 2D Echo Indications:    TIA  History:         Patient has no prior history of Echocardiogram examinations.                 Risk Factors:Hypertension and Dyslipidemia.  Sonographer:    Johny Chess Referring Phys: [place]  1. Left ventricular ejection fraction, by estimation, is 60 to 65%. The left ventricle has normal function. The left ventricle has no regional wall motion abnormalities. There is mild left ventricular hypertrophy. Left ventricular diastolic parameters are consistent with Grade I diastolic dysfunction (impaired relaxation).  2. Right ventricular systolic function is normal. The right ventricular size is normal.  3. The mitral valve is normal in structure. Trivial mitral valve regurgitation. No evidence of mitral stenosis.  4. The aortic valve is tricuspid. There is mild thickening of the aortic valve. Aortic valve regurgitation is not visualized. No aortic stenosis is present. Conclusion(s)/Recommendation(s): No intracardiac source of embolism detected on this transthoracic study. A transesophageal echocardiogram is recommended to exclude cardiac source of embolism if clinically indicated. FINDINGS  Left Ventricle: Left ventricular ejection fraction, by estimation, is 60  to 65%. The left ventricle has normal function. The left ventricle has no regional wall motion abnormalities. The left ventricular internal cavity size was normal in size. There is  mild left ventricular hypertrophy. Left ventricular diastolic parameters are consistent with Grade I diastolic dysfunction (impaired relaxation). Right Ventricle: The right ventricular size is normal. No increase in right ventricular wall thickness. Right ventricular systolic function is normal. Left Atrium: Left atrial size was normal in size. Right Atrium: Right atrial size was normal in size. Pericardium: There is no evidence of pericardial effusion. Mitral Valve: The mitral valve is normal in structure. Trivial mitral valve regurgitation. No evidence of mitral valve  stenosis. Tricuspid Valve: The tricuspid valve is normal in structure. Tricuspid valve regurgitation is trivial. No evidence of tricuspid stenosis. Aortic Valve: The aortic valve is tricuspid. There is mild thickening of the aortic valve. Aortic valve regurgitation is not visualized. No aortic stenosis is present. Pulmonic Valve: The pulmonic valve was normal in structure. Pulmonic valve regurgitation is trivial. No evidence of pulmonic stenosis. Aorta: The aortic root is normal in size and structure. Venous: The inferior vena cava was not well visualized. IAS/Shunts: The interatrial septum appears to be lipomatous. No atrial level shunt detected by color flow Doppler.  LEFT VENTRICLE PLAX 2D LVIDd:         4.90 cm  Diastology LVIDs:         3.00 cm  LV e' lateral:   7.51 cm/s LV PW:         1.10 cm  LV E/e' lateral: 12.6 LV IVS:        0.90 cm LVOT diam:     2.40 cm LV SV:         116 LV SV Index:   48 LVOT Area:     4.52 cm  RIGHT VENTRICLE TAPSE (M-mode): 2.2 cm LEFT ATRIUM             Index       RIGHT ATRIUM           Index LA diam:        4.20 cm 1.73 cm/m  RA Area:     12.30 cm LA Vol (A2C):   77.6 ml 32.00 ml/m RA Volume:   25.30 ml  10.43 ml/m LA Vol (A4C):   54.6 ml 22.52 ml/m LA Biplane Vol: 65.3 ml 26.93 ml/m  AORTIC VALVE LVOT Vmax:   124.00 cm/s LVOT Vmean:  80.200 cm/s LVOT VTI:    0.256 m  AORTA Ao Root diam: 3.60 cm Ao Asc diam:  3.40 cm MITRAL VALVE                TRICUSPID VALVE MV Area (PHT): 3.08 cm     TR Peak grad:   21.3 mmHg MV Decel Time: 246 msec     TR Vmax:        231.00 cm/s MV E velocity: 94.30 cm/s MV A velocity: 118.00 cm/s  SHUNTS MV E/A ratio:  0.80         Systemic VTI:  0.26 m                             Systemic Diam: 2.40 cm Cherlynn Kaiser MD Electronically signed by Cherlynn Kaiser MD Signature Date/Time: 10/18/2020/5:01:51 PM    Final    CT HEAD CODE STROKE WO CONTRAST  Result Date: 10/17/2020 CLINICAL DATA:  Code stroke. Initial evaluation for neuro deficit,  stroke  suspected. EXAM: CT HEAD WITHOUT CONTRAST TECHNIQUE: Contiguous axial images were obtained from the base of the skull through the vertex without intravenous contrast. COMPARISON:  None. FINDINGS: Brain: Age-related cerebral atrophy with moderate chronic microvascular ischemic disease. Small remote lacunar infarct at the right thalamus. No acute intracranial hemorrhage. No acute large vessel territory infarct. No mass lesion, midline shift or mass effect. No hydrocephalus or extra-axial fluid collection. Vascular: No hyperdense vessel. Skull: Scalp soft tissues within normal limits.  Calvarium intact. Sinuses/Orbits: Globes and orbital soft tissues demonstrate no acute finding. Paranasal sinuses are largely clear. No mastoid effusion. Other: None. ASPECTS [facility] Stroke Program Early CT Score) - Ganglionic level infarction (caudate, lentiform nuclei, internal capsule, insula, M1-M3 cortex): 7 - Supraganglionic infarction (M4-M6 cortex): 3 Total score (0-10 with 10 being normal): 10 IMPRESSION: 1. No acute intracranial infarct or other abnormality. 2. ASPECTS is 10. 3. Age-related cerebral atrophy with chronic small vessel ischemic disease. These results were communicated to Dr. Rory Percy at 2:35 amon 5/6/2022by text page via the [facility] messaging system. Electronically Signed   By: Jeannine Boga M.D.   On: 10/17/2020 02:36   CT ANGIO HEAD NECK W WO CM W PERF (CODE STROKE)  Result Date: 10/17/2020 CLINICAL DATA:  Follow-up examination for acute stroke. EXAM: CT ANGIOGRAPHY HEAD AND NECK CT PERFUSION BRAIN TECHNIQUE: Multidetector CT imaging of the head and neck was performed using the standard protocol during bolus administration of intravenous contrast. Multiplanar CT image reconstructions and MIPs were obtained to evaluate the vascular anatomy. Carotid stenosis measurements (when applicable) are obtained utilizing NASCET criteria, using the distal internal carotid diameter as the denominator. Multiphase CT  imaging of the brain was performed following IV bolus contrast injection. Subsequent parametric perfusion maps were calculated using RAPID software. CONTRAST:  4m OMNIPAQUE IOHEXOL 350 MG/ML SOLN COMPARISON:  Prior head CT from earlier the same day. FINDINGS: CTA NECK FINDINGS Aortic arch: Examination technically limited by motion and timing of the contrast bolus. Visualized aortic arch normal caliber with normal branch pattern. No stenosis about the origin of the great vessels. Right carotid system: Right CCA patent from its origin to the bifurcation. Mild mixed plaque about the right bifurcation without significant stenosis. Right ICA patent distally without stenosis, dissection or occlusion. Left carotid system: Left CCA patent from its origin to the bifurcation without stenosis. Mild mixed plaque about the left bifurcation without significant stenosis. Left ICA widely patent distally without stenosis, dissection or occlusion. Vertebral arteries: Both vertebral arteries arise from subclavian arteries. Right vertebral artery dominant. Origin and proximal left vertebral artery not well evaluated due to adjacent venous contamination. Visualized portions of the vertebral arteries patent within the neck without appreciable stenosis or other acute vascular abnormality. Skeleton: No acute osseous abnormality. No discrete or worrisome osseous lesions. Other neck: No other visible acute soft tissue abnormality within the neck. No mass or adenopathy. Upper chest: Visualized upper chest demonstrates no acute finding. Review of the MIP images confirms the above findings CTA HEAD FINDINGS Anterior circulation: Focal severe stenosis involving the horizontal petrous right ICA (series 7, image 127). Petrous left ICA widely patent. Additional scattered plaque within the carotid siphons with no more than mild stenosis elsewhere. A1 segments patent bilaterally. Normal anterior communicating artery complex. Anterior cerebral  arteries patent to their distal aspects without appreciable stenosis. No M1 stenosis or occlusion. Normal MCA bifurcations. Distal MCA branches well perfused. Posterior circulation: Dominant right V4 segment patent to the vertebrobasilar junction without stenosis. Right PICA patent.  Left vertebral artery largely occludes just as it courses into the cranial vault. Left PICA is perfused at the skull base. Basilar patent to its distal aspect without stenosis. Superior cerebellar arteries patent bilaterally. Both PCAs primarily supplied via the basilar. Multifocal atheromatous irregularity seen throughout the PCAs bilaterally. Associated multifocal severe left P2 stenoses (series 10, image 21). PCAs otherwise perfused to their distal aspects without proximal stenosis. Venous sinuses: Grossly patent allowing for timing the contrast bolus. Anatomic variants: None significant. No visible aneurysm. Review of the MIP images confirms the above findings CT Brain Perfusion Findings: ASPECTS: 10. CBF (<30%) Volume: 71m Perfusion (Tmax>6.0s) volume: 7519mMismatch Volume: 19m73mnfarction Location:Negative CT perfusion for acute ischemia. Delayed perfusion seen within the left cerebellar hemisphere related to the occluded left vertebral artery. IMPRESSION: CTA HEAD AND NECK IMPRESSION: 1. Negative CTA for emergent large vessel occlusion. 2. 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 3. Multifocal severe left P2 stenoses. 4. 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 Dominant right vertebral artery widely patent. 5. Mild atheromatous disease about the major arterial vasculature within the neck. No flow-limiting stenosis within the neck. CT PERFUSION IMPRESSION: 1. Negative CT perfusion for acute ischemia. 2. 7 cc perfusion deficit within the left cerebellar hemisphere, in keeping with the occluded left V4 segment. Electronically Signed   By: BenJeannine BogaD.   On:  10/17/2020 03:57    PHYSICAL EXAM  Temp:  [98.4 F (36.9 C)-99 F (37.2 C)] 98.6 F (37 C) (05/09 1558) Pulse Rate:  [62-89] 89 (05/09 1558) Resp:  [10-22] 17 (05/09 1558) BP: (127-185)/(79-133) 143/84 (05/09 1558) SpO2:  [95 %-100 %] 97 % (05/09 1558)  General - Well nourished, well developed, middle-aged Caucasian male Ophthalmologic - fundi not visualized due to noncooperation.  Cardiovascular - Regular rhythm and rate.  Mental Status -  Level of arousal and orientation to time, place, and person were intact. Language including expression, naming, repetition, comprehension was assessed and found intact. Fund of Knowledge was assessed and was intact.  Cranial Nerves II - XII - II - Visual field intact OU. III, IV, VI - Extraocular movements intact.  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 V - Facial light touch sensation intact bilaterally, but feeling left facial tingling. VII - Facial movement intact bilaterally. VIII - Hearing intact bilaterally. Upbeat nystagmus on bilateral horizontal gaze, more prominent on the left gaze X - Palate elevates symmetrically. Pt does have hoarseness.   XI - Chin turning & shoulder shrug intact bilaterally. XII - Tongue protrusion intact.  Motor Strength - The patient's strength was normal in all extremities and pronator drift was absent.  Bulk was normal and fasciculations were absent.   Motor Tone - Muscle tone was assessed at the neck and appendages and was normal.  Reflexes - The patient's reflexes were symmetrical in all extremities and he had no pathological reflexes.  Sensory - Light touch, temperature/pinprick were assessed and were symmetrical.    Coordination - The patient had normal movements in the right hand and leg with no ataxia or dysmetria. However, left FTN ataxia and HTS mild dysmetria Tremor was absent.  Gait and Station - deferred.   ASSESSMENT/PLAN Mr. JefDerrick Tiegs. is a 65 35o.  male with history of hypertension, hyperlipidemia, TIA 02/2020 admitted for transient left temporal headache, vertigo, swallowing difficulty, hoarseness, left-sided numbness and discoordination after 2 bad sneezes. No tPA given due to  mild symptoms.      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  Focal severe stenosis right petrous ICA, tandem stenosis severe left P2  MR repeat confirmed small punctate left lateral medullary infarct   cerebral angiogram 10/20/2020 confirms left vertebral artery occlusion   2D Echo ejection fraction 60 to 65%.  No wall motion abnormalities.  LDL 80  HgbA1c 5.8  ESR 5 and CRP 0.6  Hypercoagulable labs pending and autoimmune labs negative Lovenox for VTE prophylaxis  aspirin 81 mg daily prior to admission, now on aspirin 325 mg daily and clopidogrel 75 mg daily.  Continue DAPT for 3 months and then Plavix alone given intracranial stenosis/occlusion.  Patient counseled to be compliant with his antithrombotic medications  Ongoing aggressive stroke risk factor management  Therapy recommendations: Pending  Disposition: Pending  History of TIA  02/2020 admitted to [facility] for similar episode with difficulty walking, left-sided numbness tingling, dizziness after 5 episode of sneeze.  CTA head and neck was reported normal however, on further review, patient ready had right petrous ICA severe stenosis, left V4 occlusion.  Left P2 stenosis present however not reported likely due to venous contamination.  MRI no acute infarct.  2D echo unremarkable.  Patient symptoms resolved in ED at the time.  Patient discharged with DAPT and Lipitor 80.  Now we think it back, concerning that episode  likely due to left VA dissection / occlusion after sneeze  Hypertension . Stable on the high end . Permissive hypertension (OK if <220/120) for 24-48 hours post stroke and then gradually reach goal within 5-7 days.  Long term BP goal 130-150 given multifocal intracranial severe stenosis.  Hyperlipidemia  Home meds: Crestor 20  LDL 80, goal < 70  Now on Crestor 40  Continue statin at discharge  Other Stroke Risk Factors  Advanced age  Obesity, Body mass index is 34.96 kg/m.   Other Active Problems    Hospital day # 3  Recommend aspirin and Plavix for 3 months given large vessel occlusion followed by Plavix alone and aggressive risk factor modification.  I discussed with Dr. Norma Fredrickson and Dr. Eliseo Squires I spent  35 minutes in total face-to-face time with the patient, more than 50% of which was spent in counseling and coordination of care, reviewing test results, images and medication, and discussing the diagnosis, treatment plan and potential prognosis. This patient's care requiresreview of multiple databases, neurological assessment, discussion with family, other specialists and medical decision making of high complexity. I had long discussion with patient and wife at bedside, updated pt current condition, treatment plan and potential prognosis, and answered all the questions.  They expressed understanding and appreciation.    Antony Contras, MD   10/20/2020 5:19 PM    To contact Stroke Continuity provider, please refer to [email or web address] After hours, contact General Neurology

## 2020-10-20 NOTE — Sedation Documentation (Signed)
5 Fr Exoseal deployed- right groin, holding pressure at this time

## 2020-10-20 NOTE — Progress Notes (Signed)
Progress Note    Dennis Bis Sr.  KNL:[id number] DOB: 04-28-1956  DOA: 10/17/2020 PCP: Ignacia Palma., MD    Brief Narrative:     Medical records reviewed and are as summarized below:  Dennis Bis Sr. is an 65 y.o. male with medical history significant of HTN; IGT; elevated PSA; TKR 4 months ago; and TIA (02/2020) presenting with neurologic changes.He notices L face and foot tingling and voice changes.   Over the last 1-2 weeks, he has had severe vertigo and was prescribed Meclizine.  Last night, he noticed severe headache that woke him from sleep in his left temple.  Symptoms were similar to prior TIA.  +CVA on MRI. CIR pending  Assessment/Plan:   Principal Problem:   Acute CVA (cerebrovascular accident) (HCC) Active Problems:   Hypertension   Dyslipidemia   BPH (benign prostatic hyperplasia)   Impaired glucose tolerance   Vertigo   Occlusion of left vertebral artery   CVA -Patient presenting with hypophonia, severe vertigo, and L face and body tingling - MRI: New focus of abnormal diffusion restriction within the left dorsal medulla oblongata, consistent with acute to subacute infarct., s/p cerebral angiogram:  -Echo: Left ventricular ejection fraction, by estimation, is 60 to 65%. The  left ventricle has normal function. The left ventricle has no regional  wall motion abnormalities. There is mild left ventricular hypertrophy.  Left ventricular diastolic parameters  are consistent with Grade I diastolic dysfunction (impaired relaxation -Neurology consult: Dual antiplatelet is recommended for 3 months.   -PT/OT- CIR -asa/plavix  HTN -Allow permissive HTNfor now -Holdlisinopril and plan to restart after angiogram  HLD -Check FLP - 156/38/80/189 -Continue Crestor but increase to 20 mg daily for goal LDL <70  IGT -A1c is 5.8, indicating good control -Hold home PO Metformin -Will order moderate-scale SSI -Continue Neurontin  BPH -Continue Proscar -Hold  tamsulosin for now  obesity Body mass index is 34.96 kg/m.   Family Communication/Anticipated D/C date and plan/Code Status   DVT prophylaxis: Lovenox ordered. Code Status: Full Code.  Disposition Plan: Status is: Inpatient  Remains inpatient appropriate because:IV treatments appropriate due to intensity of illness or inability to take PO and Inpatient level of care appropriate due to severity of illness   Dispo: The patient is from: Home              Anticipated d/c is to: Home              Patient currently is not medically stable to d/c.   Difficult to place patient No         Medical Consultants:    IR  neurology  Subjective:   Just back from IR  Objective:    Vitals:   10/20/20 1130 10/20/20 1145 10/20/20 1215 10/20/20 1245  BP: (!) 141/82 140/86 (!) 154/94 (!) 145/98  Pulse: 63 77 71 83  Resp: 18 (!) 22 14 18   Temp:      TempSrc:      SpO2: 100% 97% 97% 98%  Weight:      Height:        Intake/Output Summary (Last 24 hours) at 10/20/2020 1251 Last data filed at 10/20/2020 0408 Gross per 24 hour  Intake 480 ml  Output 900 ml  Net -420 ml   Filed Weights   10/17/20 1806  Weight: 120.2 kg    Exam:  General: Appearance:    Obese male in no acute distress     Lungs:  respirations unlabored  Heart:    Normal heart rate. Normal rhythm. No murmurs, rubs, or gallops.   MS:   All extremities are intact.   Neurologic:   Awake, alert, oriented x 3    Data Reviewed:   I have personally reviewed following labs and imaging studies:  Labs: Labs show the following:   Basic Metabolic Panel: Recent Labs  Lab 10/17/20 0218 10/17/20 0227  NA 141 142  K 4.1 4.0  CL 106 108  CO2 26  --   GLUCOSE 105* 107*  BUN 24* 24*  CREATININE 1.17 1.10  CALCIUM 9.4  --    GFR Estimated Creatinine Clearance: 89.7 mL/min (by C-G formula based on SCr of 1.1 mg/dL). Liver Function Tests: Recent Labs  Lab 10/17/20 0218  AST 27  ALT 28  ALKPHOS 75   BILITOT 0.4  PROT 7.0  ALBUMIN 4.2   No results for input(s): LIPASE, AMYLASE in the last 168 hours. No results for input(s): AMMONIA in the last 168 hours. Coagulation profile Recent Labs  Lab 10/17/20 0218  INR 1.0    CBC: Recent Labs  Lab 10/17/20 0218 10/17/20 0227  WBC 7.0  --   NEUTROABS 4.4  --   HGB 13.9 13.9  HCT 43.9 41.0  MCV 88.9  --   PLT 151  --    Cardiac Enzymes: No results for input(s): CKTOTAL, CKMB, CKMBINDEX, TROPONINI in the last 168 hours. BNP (last 3 results) No results for input(s): PROBNP in the last 8760 hours. CBG: Recent Labs  Lab 10/19/20 0609 10/19/20 1102 10/19/20 1558 10/19/20 2105 10/20/20 0605  GLUCAP 98 100* 101* 147* 111*   D-Dimer: No results for input(s): DDIMER in the last 72 hours. Hgb A1c: No results for input(s): HGBA1C in the last 72 hours. Lipid Profile: No results for input(s): CHOL, HDL, LDLCALC, TRIG, CHOLHDL, LDLDIRECT in the last 72 hours. Thyroid function studies: No results for input(s): TSH, T4TOTAL, T3FREE, THYROIDAB in the last 72 hours.  Invalid input(s): FREET3 Anemia work up: No results for input(s): VITAMINB12, FOLATE, FERRITIN, TIBC, IRON, RETICCTPCT in the last 72 hours. Sepsis Labs: Recent Labs  Lab 10/17/20 0218  WBC 7.0    Microbiology Recent Results (from the past 240 hour(s))  SARS CORONAVIRUS 2 (TAT 6-24 HRS) Nasopharyngeal Nasopharyngeal Swab     Status: None   Collection Time: 10/17/20  9:13 AM   Specimen: Nasopharyngeal Swab  Result Value Ref Range Status   SARS Coronavirus 2 NEGATIVE NEGATIVE Final    Comment: (NOTE) SARS-CoV-2 target nucleic acids are NOT DETECTED.  The SARS-CoV-2 RNA is generally detectable in upper and lower respiratory specimens during the acute phase of infection. Negative results do not preclude SARS-CoV-2 infection, do not rule out co-infections with other pathogens, and should not be used as the sole basis for treatment or other patient management  decisions. Negative results must be combined with clinical observations, patient history, and epidemiological information. The expected result is Negative.  Fact Sheet for Patients: HairSlick.no  Fact Sheet for Healthcare Providers: quierodirigir.com  This test is not yet approved or cleared by the [place] FDA and  has been authorized for detection and/or diagnosis of SARS-CoV-2 by FDA under an Emergency Use Authorization (EUA). This EUA will remain  in effect (meaning this test can be used) for the duration of the COVID-19 declaration under Se ction 564(b)(1) of the Act, 21 U.S.C. section 360bbb-3(b)(1), unless the authorization is terminated or revoked sooner.  Performed at [facility]  Lab, [address], [place], [place] [address]     Procedures and diagnostic studies:  ECHOCARDIOGRAM COMPLETE  Result Date: 10/18/2020    ECHOCARDIOGRAM REPORT   Patient Name:   Kaiden Pech Sr. Date of Exam: 10/18/2020 Medical Rec #:  [id number]        Height:       73.0 in Accession #:    [id number]       Weight:       265.0 lb Date of Birth:  1956/02/22        BSA:          2.425 m Patient Age:    65 years         BP:           117/52 mmHg Patient Gender: M                HR:           67 bpm. Exam Location:  Inpatient Procedure: 2D Echo Indications:    TIA  History:        Patient has no prior history of Echocardiogram examinations.                 Risk Factors:Hypertension and Dyslipidemia.  Sonographer:    Delcie Roch Referring Phys: [id number] JENNIFER YATES IMPRESSIONS  1. Left ventricular ejection fraction, by estimation, is 60 to 65%. The left ventricle has normal function. The left ventricle has no regional wall motion abnormalities. There is mild left ventricular hypertrophy. Left ventricular diastolic parameters are consistent with Grade I diastolic dysfunction (impaired relaxation).  2. Right ventricular systolic function is normal.  The right ventricular size is normal.  3. The mitral valve is normal in structure. Trivial mitral valve regurgitation. No evidence of mitral stenosis.  4. The aortic valve is tricuspid. There is mild thickening of the aortic valve. Aortic valve regurgitation is not visualized. No aortic stenosis is present. Conclusion(s)/Recommendation(s): No intracardiac source of embolism detected on this transthoracic study. A transesophageal echocardiogram is recommended to exclude cardiac source of embolism if clinically indicated. FINDINGS  Left Ventricle: Left ventricular ejection fraction, by estimation, is 60 to 65%. The left ventricle has normal function. The left ventricle has no regional wall motion abnormalities. The left ventricular internal cavity size was normal in size. There is  mild left ventricular hypertrophy. Left ventricular diastolic parameters are consistent with Grade I diastolic dysfunction (impaired relaxation). Right Ventricle: The right ventricular size is normal. No increase in right ventricular wall thickness. Right ventricular systolic function is normal. Left Atrium: Left atrial size was normal in size. Right Atrium: Right atrial size was normal in size. Pericardium: There is no evidence of pericardial effusion. Mitral Valve: The mitral valve is normal in structure. Trivial mitral valve regurgitation. No evidence of mitral valve stenosis. Tricuspid Valve: The tricuspid valve is normal in structure. Tricuspid valve regurgitation is trivial. No evidence of tricuspid stenosis. Aortic Valve: The aortic valve is tricuspid. There is mild thickening of the aortic valve. Aortic valve regurgitation is not visualized. No aortic stenosis is present. Pulmonic Valve: The pulmonic valve was normal in structure. Pulmonic valve regurgitation is trivial. No evidence of pulmonic stenosis. Aorta: The aortic root is normal in size and structure. Venous: The inferior vena cava was not well visualized. IAS/Shunts: The  interatrial septum appears to be lipomatous. No atrial level shunt detected by color flow Doppler.  LEFT VENTRICLE PLAX 2D LVIDd:         4.90 cm  Diastology LVIDs:         3.00 cm  LV e' lateral:   7.51 cm/s LV PW:         1.10 cm  LV E/e' lateral: 12.6 LV IVS:        0.90 cm LVOT diam:     2.40 cm LV SV:         116 LV SV Index:   48 LVOT Area:     4.52 cm  RIGHT VENTRICLE TAPSE (M-mode): 2.2 cm LEFT ATRIUM             Index       RIGHT ATRIUM           Index LA diam:        4.20 cm 1.73 cm/m  RA Area:     12.30 cm LA Vol (A2C):   77.6 ml 32.00 ml/m RA Volume:   25.30 ml  10.43 ml/m LA Vol (A4C):   54.6 ml 22.52 ml/m LA Biplane Vol: 65.3 ml 26.93 ml/m  AORTIC VALVE LVOT Vmax:   124.00 cm/s LVOT Vmean:  80.200 cm/s LVOT VTI:    0.256 m  AORTA Ao Root diam: 3.60 cm Ao Asc diam:  3.40 cm MITRAL VALVE                TRICUSPID VALVE MV Area (PHT): 3.08 cm     TR Peak grad:   21.3 mmHg MV Decel Time: 246 msec     TR Vmax:        231.00 cm/s MV E velocity: 94.30 cm/s MV A velocity: 118.00 cm/s  SHUNTS MV E/A ratio:  0.80         Systemic VTI:  0.26 m                             Systemic Diam: 2.40 cm Weston BrassGayatri Acharya MD Electronically signed by Weston BrassGayatri Acharya MD Signature Date/Time: 10/18/2020/5:01:51 PM    Final     Medications:   .  stroke: mapping our early stages of recovery book   Does not apply Once  . acetaminophen  500 mg Oral BID  . aspirin  325 mg Oral Daily  . clopidogrel  75 mg Oral Daily  . enoxaparin (LOVENOX) injection  40 mg Subcutaneous Q24H  . feeding supplement  237 mL Oral BID BM  . finasteride  5 mg Oral Daily  . gabapentin  300 mg Oral Daily  . insulin aspart  0-9 Units Subcutaneous TID WC  . iohexol      . multivitamin with minerals  1 tablet Oral Daily  . rosuvastatin  40 mg Oral QHS   Continuous Infusions:    LOS: 3 days   Joseph ArtJessica U Pheng Prokop  [facility]   How to contact the [facility] Attending or Consulting provider 7A - 7P or covering provider during after hours 7P  -7A, for this patient?  1. Check the care team in [facility] and look for a) attending/consulting [facility] provider listed and b) the [facility] team listed 2. Log into [email or web address] and use 's universal password to access. If you do not have the password, please contact the hospital operator. 3. Locate the [facility] provider you are looking for under [facility] and page to a number that you can be directly reached. 4. If you still have difficulty reaching the provider, please page the [facility] (Director on Call) for the Hospitalists listed on amion for  assistance.  10/20/2020, 12:51 PM

## 2020-10-20 NOTE — Progress Notes (Signed)
Inpatient Rehab Admissions Coordinator:   Consult received.  I called patient's room but he was off the floor for procedure.  I spoke to his wife over the phone to review expectations and goals of CIR.  We discussed average length of stay to be about 2 weeks, but dependent upon progress could be more or less.  We discussed goals of supervision level, and [name] states that pt will have support from herself, and her daughter who was a CNA.  They have all equipment left over from pt's TKA in January.  She is agreeable to start insurance authorization process, which I will do today.    Estill Dooms, PT, DPT Admissions Coordinator [phone] 10/20/20  10:44 AM

## 2020-10-20 NOTE — Procedures (Signed)
INTERVENTIONAL NEURORADIOLOGY BRIEF POSTPROCEDURE NOTE  Diagnostic cerebral angiogram   Attending: Dr. Baldemar Lenis  Assistant: None.  Diagnosis: Intracranial atherosclerotic disease  Access site: RCFA, 27F; Right distal radial artery, 27F  Access closure: 27F exoseal; inflatable band  Anesthesia: Moderate sedation  Medication used: 1.5 Mg Versed IV; 50 mcg Fentanyl IV.  Complications: None.   Estimated blood loss: Negligible.   Specimen: None.   Findings: Occlusion of the intracranial left vertebral artery.  Intracranial atherosclerotic disease with occlusion of the intracranial left vertebral artery and approximately 60% stenosis of the petrous segment of the right ICA. See further details in complete report.  The patient tolerated the procedure well without incident or complication and is in stable condition.

## 2020-10-21 ENCOUNTER — Inpatient Hospital Stay (HOSPITAL_COMMUNITY)
Admission: RE | Admit: 2020-10-21 | Discharge: 2020-10-31 | DRG: 057 | Disposition: A | Discharge status: Home / Self Care | Payer: Medicare Other | Source: Intra-hospital | Attending: Physical Medicine and Rehabilitation | Admitting: Physical Medicine and Rehabilitation

## 2020-10-21 ENCOUNTER — Other Ambulatory Visit: Payer: Self-pay

## 2020-10-21 ENCOUNTER — Encounter (HOSPITAL_COMMUNITY): Payer: Self-pay | Admitting: Physical Medicine and Rehabilitation

## 2020-10-21 DIAGNOSIS — E785 Hyperlipidemia, unspecified: Secondary | ICD-10-CM | POA: Diagnosis present

## 2020-10-21 DIAGNOSIS — Z7984 Long term (current) use of oral hypoglycemic drugs: Secondary | ICD-10-CM

## 2020-10-21 DIAGNOSIS — R7303 Prediabetes: Secondary | ICD-10-CM | POA: Diagnosis present

## 2020-10-21 DIAGNOSIS — E669 Obesity, unspecified: Secondary | ICD-10-CM | POA: Diagnosis present

## 2020-10-21 DIAGNOSIS — H532 Diplopia: Secondary | ICD-10-CM | POA: Diagnosis present

## 2020-10-21 DIAGNOSIS — R110 Nausea: Secondary | ICD-10-CM | POA: Diagnosis present

## 2020-10-21 DIAGNOSIS — N400 Enlarged prostate without lower urinary tract symptoms: Secondary | ICD-10-CM | POA: Diagnosis present

## 2020-10-21 DIAGNOSIS — R490 Dysphonia: Secondary | ICD-10-CM | POA: Diagnosis present

## 2020-10-21 DIAGNOSIS — I69398 Other sequelae of cerebral infarction: Secondary | ICD-10-CM | POA: Diagnosis present

## 2020-10-21 DIAGNOSIS — Z6834 Body mass index (BMI) 34.0-34.9, adult: Secondary | ICD-10-CM | POA: Diagnosis not present

## 2020-10-21 DIAGNOSIS — Z79899 Other long term (current) drug therapy: Secondary | ICD-10-CM | POA: Diagnosis not present

## 2020-10-21 DIAGNOSIS — J3801 Paralysis of vocal cords and larynx, unilateral: Secondary | ICD-10-CM | POA: Diagnosis present

## 2020-10-21 DIAGNOSIS — G463 Brain stem stroke syndrome: Secondary | ICD-10-CM | POA: Diagnosis not present

## 2020-10-21 DIAGNOSIS — Z8249 Family history of ischemic heart disease and other diseases of the circulatory system: Secondary | ICD-10-CM

## 2020-10-21 DIAGNOSIS — R200 Anesthesia of skin: Secondary | ICD-10-CM | POA: Diagnosis present

## 2020-10-21 DIAGNOSIS — I639 Cerebral infarction, unspecified: Secondary | ICD-10-CM | POA: Diagnosis present

## 2020-10-21 DIAGNOSIS — R42 Dizziness and giddiness: Secondary | ICD-10-CM | POA: Diagnosis present

## 2020-10-21 DIAGNOSIS — Z7982 Long term (current) use of aspirin: Secondary | ICD-10-CM

## 2020-10-21 DIAGNOSIS — R2689 Other abnormalities of gait and mobility: Secondary | ICD-10-CM | POA: Diagnosis present

## 2020-10-21 DIAGNOSIS — Z832 Family history of diseases of the blood and blood-forming organs and certain disorders involving the immune mechanism: Secondary | ICD-10-CM

## 2020-10-21 DIAGNOSIS — Z823 Family history of stroke: Secondary | ICD-10-CM | POA: Diagnosis not present

## 2020-10-21 DIAGNOSIS — I10 Essential (primary) hypertension: Secondary | ICD-10-CM | POA: Diagnosis present

## 2020-10-21 DIAGNOSIS — G464 Cerebellar stroke syndrome: Secondary | ICD-10-CM

## 2020-10-21 LAB — GLUCOSE, CAPILLARY
Glucose-Capillary: 122 mg/dL — ABNORMAL HIGH (ref 70–99)
Glucose-Capillary: 121 mg/dL — ABNORMAL HIGH (ref 70–99)
Glucose-Capillary: 130 mg/dL — ABNORMAL HIGH (ref 70–99)
Glucose-Capillary: 117 mg/dL — ABNORMAL HIGH (ref 70–99)

## 2020-10-21 LAB — CRYOGLOBULIN

## 2020-10-21 MED ORDER — CLOPIDOGREL BISULFATE 75 MG PO TABS: 75.0000 mg | ORAL_TABLET | Freq: Every day | ORAL | Status: DC

## 2020-10-21 MED ORDER — SENNOSIDES-DOCUSATE SODIUM 8.6-50 MG PO TABS
1.0000 | ORAL_TABLET | Freq: Every evening | ORAL | Status: DC | PRN
  Administered 2020-10-29: 1 via ORAL
  Filled 2020-10-21 (×2): qty 1

## 2020-10-21 MED ORDER — EXERCISE FOR HEART AND HEALTH BOOK
Freq: Once | Status: AC
  Filled 2020-10-21: qty 1

## 2020-10-21 MED ORDER — BLOOD PRESSURE CONTROL BOOK
Freq: Once | Status: AC
  Filled 2020-10-21: qty 1

## 2020-10-21 MED ORDER — ASPIRIN 325 MG PO TABS: 325.0000 mg | ORAL_TABLET | Freq: Every day | ORAL | Status: AC

## 2020-10-21 MED ORDER — ADULT MULTIVITAMIN W/MINERALS CH
1.0000 | ORAL_TABLET | Freq: Every day | ORAL | Status: DC
  Administered 2020-10-22 – 2020-10-31 (×10): 1 via ORAL
  Filled 2020-10-21 (×10): qty 1

## 2020-10-21 MED ORDER — ENSURE ENLIVE PO LIQD
237.0000 mL | Freq: Two times a day (BID) | ORAL | Status: DC
  Administered 2020-10-22 – 2020-10-23 (×3): 237 mL via ORAL

## 2020-10-21 MED ORDER — ENOXAPARIN SODIUM 40 MG/0.4ML IJ SOSY: 40.0000 mg | PREFILLED_SYRINGE | INTRAMUSCULAR | Status: DC

## 2020-10-21 MED ORDER — ROSUVASTATIN CALCIUM 20 MG PO TABS
40.0000 mg | ORAL_TABLET | Freq: Every day | ORAL | Status: DC
  Administered 2020-10-21 – 2020-10-30 (×10): 40 mg via ORAL
  Filled 2020-10-21 (×10): qty 2

## 2020-10-21 MED ORDER — ACETAMINOPHEN 325 MG PO TABS
650.0000 mg | ORAL_TABLET | ORAL | Status: DC | PRN
  Administered 2020-10-25 – 2020-10-30 (×8): 650 mg via ORAL
  Filled 2020-10-21 (×8): qty 2

## 2020-10-21 MED ORDER — ACETAMINOPHEN 650 MG RE SUPP: 650.0000 mg | RECTAL | Status: DC | PRN

## 2020-10-21 MED ORDER — ENOXAPARIN SODIUM 40 MG/0.4ML IJ SOSY
40.0000 mg | PREFILLED_SYRINGE | INTRAMUSCULAR | Status: DC
  Administered 2020-10-21 – 2020-10-27 (×7): 40 mg via SUBCUTANEOUS
  Filled 2020-10-21 (×7): qty 0.4

## 2020-10-21 MED ORDER — ACETAMINOPHEN 325 MG PO TABS: 325.0000 mg | ORAL_TABLET | ORAL | Status: DC | PRN

## 2020-10-21 MED ORDER — CLOPIDOGREL BISULFATE 75 MG PO TABS
75.0000 mg | ORAL_TABLET | Freq: Every day | ORAL | Status: DC
  Administered 2020-10-22 – 2020-10-31 (×10): 75 mg via ORAL
  Filled 2020-10-21 (×10): qty 1

## 2020-10-21 MED ORDER — METFORMIN HCL 500 MG PO TABS: 500.0000 mg | ORAL_TABLET | Freq: Two times a day (BID) | ORAL | Status: DC

## 2020-10-21 MED ORDER — ACETAMINOPHEN 160 MG/5ML PO SOLN: 650.0000 mg | ORAL | Status: DC | PRN

## 2020-10-21 MED ORDER — INSULIN ASPART 100 UNIT/ML IJ SOLN
0.0000 [IU] | Freq: Three times a day (TID) | INTRAMUSCULAR | Status: DC
  Administered 2020-10-21 – 2020-10-27 (×2): 1 [IU] via SUBCUTANEOUS

## 2020-10-21 MED ORDER — ADULT MULTIVITAMIN W/MINERALS CH: 1.0000 | ORAL_TABLET | Freq: Every day | ORAL | Status: AC

## 2020-10-21 MED ORDER — FINASTERIDE 5 MG PO TABS
5.0000 mg | ORAL_TABLET | Freq: Every day | ORAL | Status: DC
  Administered 2020-10-22 – 2020-10-31 (×10): 5 mg via ORAL
  Filled 2020-10-21 (×10): qty 1

## 2020-10-21 MED ORDER — ROSUVASTATIN CALCIUM 40 MG PO TABS: 40.0000 mg | ORAL_TABLET | Freq: Every day | ORAL | Status: DC

## 2020-10-21 MED ORDER — GABAPENTIN 300 MG PO CAPS
300.0000 mg | ORAL_CAPSULE | Freq: Every day | ORAL | Status: DC
  Administered 2020-10-22 – 2020-10-23 (×2): 300 mg via ORAL
  Filled 2020-10-21 (×2): qty 1

## 2020-10-21 MED ORDER — ASPIRIN 325 MG PO TABS
325.0000 mg | ORAL_TABLET | Freq: Every day | ORAL | Status: DC
  Administered 2020-10-22 – 2020-10-31 (×10): 325 mg via ORAL
  Filled 2020-10-21 (×10): qty 1

## 2020-10-21 NOTE — Progress Notes (Signed)
Physical Therapy Treatment Patient Details Name: Dennis Hard Sr. MRN: [id number] DOB: 07/04/1955 Today's Date: 10/21/2020    History of Present Illness Pt is a 65 y.o. M who presents with hypophonia, severe vertigo, and L face/body tingling. 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 CTA showing left V4 occlusion. Plan for cerebral angiogram on Monday. Significant PMH: R TKA 4 months ago, HTN, TIA (02/2020).    PT Comments    Continuing work on functional mobility and activity tolerance;  Significant dizziness and dyscoordination persist, but pt was able to take a few steps today with moderate assist of 2; he is a Chief Executive Officer and very motivated; I anticipate good progress at CIR    Follow Up Recommendations  CIR     Equipment Recommendations  Other (comment) (TBA)    Recommendations for Other Services       Precautions / Restrictions Precautions Precautions: Fall;Other (comment) Precaution Comments: Diplopia    Mobility  Bed Mobility Overal bed mobility: Needs Assistance Bed Mobility: Supine to Sit     Supine to sit: Min guard     General bed mobility comments: Minguard for safety; improving, and able to stabilize without physical assist upon sitting    Transfers Overall transfer level: Needs assistance Equipment used: None Transfers: Sit to/from Stand Sit to Stand: Min assist         General transfer comment: min assist to rise and steady, substantial sway almost immediately upon standing  Ambulation/Gait Ambulation/Gait assistance: Mod assist;+2 physical assistance Gait Distance (Feet): 5 Feet (including pivot steps) Assistive device: 2 person hand held assist       General Gait Details: heavy mod assist to steady and max step-by-step cues for LE advancement, weight shfit into single limb stance, and stabilize before taking the next step; Marked difficulty  stabilizing center of mass over stance LE (R and L)   Stairs             Wheelchair Mobility    Modified Rankin (Stroke Patients Only) Modified Rankin (Stroke Patients Only) Pre-Morbid Rankin Score: No symptoms Modified Rankin: Severe disability     Balance Overall balance assessment: Needs assistance Sitting-balance support: Feet supported Sitting balance-Leahy Scale: Fair   Postural control: Left lateral lean   Standing balance-Leahy Scale: Poor Standing balance comment: reliant on single UE support, wide BOS                            Cognition Arousal/Alertness: Awake/alert Behavior During Therapy: WFL for tasks assessed/performed Overall Cognitive Status: Within Functional Limits for tasks assessed                                 General Comments: Descriptive and aware of deficits      Exercises      General Comments        Pertinent Vitals/Pain Pain Assessment: No/denies pain    Home Living                      Prior Function            PT Goals (current goals can now be found in the care plan section) Acute Rehab PT Goals Patient Stated Goal: to get back to normal PT Goal Formulation: With patient Time For Goal Achievement: 11/01/20 Potential to Achieve  Goals: Good Progress towards PT goals: Progressing toward goals    Frequency    Min 4X/week      PT Plan Current plan remains appropriate    Co-evaluation              AM-PAC PT "6 Clicks" Mobility   Outcome Measure  Help needed turning from your back to your side while in a flat bed without using bedrails?: None Help needed moving from lying on your back to sitting on the side of a flat bed without using bedrails?: A Little Help needed moving to and from a bed to a chair (including a wheelchair)?: A Lot Help needed standing up from a chair using your arms (e.g., wheelchair or bedside chair)?: A Lot Help needed to walk in hospital room?: A  Lot Help needed climbing 3-5 steps with a railing? : A Lot 6 Click Score: 15    End of Session Equipment Utilized During Treatment: Gait belt Activity Tolerance: Other (comment) (limited by diplopia, dizziness) Patient left: in chair;Other (comment);with call bell/phone within reach (preparing for transfer to Rehab bed) Nurse Communication: Mobility status PT Visit Diagnosis: Unsteadiness on feet (R26.81);Difficulty in walking, not elsewhere classified (R26.2);Other symptoms and signs involving the nervous system (R29.898)     Time: 7939-0300 PT Time Calculation (min) (ACUTE ONLY): 12 min  Charges:  $Gait Training: 8-22 mins                     Van Clines, [place]  Acute Rehabilitation Services Pager [phone] Office [phone]    Levi Aland 10/21/2020, 2:23 PM

## 2020-10-21 NOTE — Progress Notes (Signed)
Physical Medicine and Rehabilitation Consult Reason for Consult: Left side numbness, incoordination and headache Referring Physician: Dr. Marlin Canary     HPI: Dennis Valone Sr. is a 65 y.o. right-handed male with history of BPH hyperlipidemia hypertension, presumed TIA September 2021 maintained on aspirin 81 mg daily.  Per chart review patient lives with spouse daughter and grandson.  Multilevel home.  Independent prior to admission.  Patient still works full-time.  Presented 10/17/2020 with left-sided numbness incoordination and headache of acute onset.  Noted systolic blood pressures 180s to 190s.  CT/MRI showed no acute intracranial abnormality.  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  CT angiogram of head and neck negative CTA for emergent large vessel occlusion.  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  Multifocal severe left P2 stenosis.  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  Echocardiogram with ejection fraction of 60 to 65% no wall motion abnormalities grade 1 diastolic dysfunction.  Patient did not receive tPA.  Follow-up MRI showed new focus of abnormal diffusion restriction within the left dorsal medulla oblongata consistent with acute to subacute infarction.  Admission chemistries unremarkable aside glucose 105 BUN 24, hemoglobin A1c 5.8.  Currently maintained on aspirin 325 mg daily and Plavix 75 mg daily for CVA prophylaxis.  Subcutaneous Lovenox for DVT prophylaxis.  Awaiting plan for cerebral angiogram.  Therapy evaluations completed due to patient decreased functional mobility left side numbness recommendations of physical medicine rehab consult.   Patient just has come back from cerebral angiogram.  Initially access was tried at the right groin but then moved up to the right  Radial.  Currently the patient does  not move the right upper or right lower limb for least 4 hours. Patient notes that he does not feel cold on the left side when he swallows cold water Swallowing liquids normally per patient.  Solids needs to chew a little extra.   Review of Systems  Constitutional: Negative for chills and fever.  HENT: Negative for hearing loss.   Eyes: Negative for blurred vision and double vision.  Respiratory: Negative for cough and shortness of breath.   Cardiovascular: Negative for chest pain, palpitations and leg swelling.  Gastrointestinal: Positive for constipation. Negative for heartburn, nausea and vomiting.  Genitourinary: Negative for dysuria, flank pain and hematuria.  Musculoskeletal: Positive for joint pain and myalgias.  Skin: Negative for rash.  Neurological: Positive for dizziness, sensory change, weakness and headaches.  All other systems reviewed and are negative.       Past Medical History:  Diagnosis Date  . BPH (benign prostatic hyperplasia)    . Dyslipidemia    . Hypertension    . Impaired glucose tolerance    . TIA (transient ischemic attack) 02/2020         Past Surgical History:  Procedure Laterality Date  . TOTAL KNEE ARTHROPLASTY             Family History  Problem Relation Age of Onset  . Hypertension Mother    . CVA Sister    . Lupus Sister    . CVA Brother 79    Social History:  reports that he has never smoked. He has never used smokeless tobacco. He reports that he does not drink alcohol and does not use drugs. Allergies: No Known Allergies  Medications Prior to Admission  Medication Sig Dispense Refill  . acetaminophen (TYLENOL) 650 MG CR tablet Take 1,300 mg by mouth in the morning and at bedtime.      Marland Kitchen. aspirin EC 81 MG tablet Take 162 mg by mouth in the morning and at bedtime. Swallow whole.      . Calcium Carb-Cholecalciferol 500-400 MG-UNIT TABS Take 1 tablet by mouth daily.      . cholecalciferol (VITAMIN D) 25 MCG (1000 UNIT) tablet Take  1,000 Units by mouth daily.      . finasteride (PROSCAR) 5 MG tablet Take 5 mg by mouth daily.      Marland Kitchen. gabapentin (NEURONTIN) 300 MG capsule Take 300 mg by mouth daily.      Marland Kitchen. lisinopril (ZESTRIL) 20 MG tablet Take 20 mg by mouth 2 (two) times daily.      . metFORMIN (GLUCOPHAGE) 500 MG tablet Take 500 mg by mouth 2 (two) times daily.      . rosuvastatin (CRESTOR) 10 MG tablet Take 10 mg by mouth at bedtime.      . tamsulosin (FLOMAX) 0.4 MG CAPS capsule Take 0.8 mg by mouth daily.      . vitamin C (ASCORBIC ACID) 500 MG tablet Take 500 mg by mouth daily.          Home: Home Living Family/patient expects to be discharged to:: Private residence Living Arrangements: Children,Spouse/significant other Available Help at Discharge: Family,Available 24 hours/day Type of Home: House Home Access: Stairs to enter Entergy CorporationEntrance Stairs-Number of Steps: 1 Home Layout: Multi-level Alternate Level Stairs-Number of Steps: 4 Alternate Level Stairs-Rails: Left Bathroom Shower/Tub: Walk-in shower Home Equipment: Bedside commode,Shower seat - built in,Walker - 2 wheels,Cane - single point Additional Comments: Lives with wife  Functional History: Prior Function Level of Independence: Independent Comments: 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 Functional Status:  Mobility: Bed Mobility Overal bed mobility: Needs Assistance Bed Mobility: Supine to Sit,Sit to Supine Supine to sit: Min assist Sit to supine: Min assist General bed mobility comments: assist for balance upon moving to EOB and assist to control descent when moving shoulders back toward the bed Transfers Overall transfer level: Needs assistance Equipment used: None Transfers: Sit to/from Stand Sit to Stand: Min assist General transfer comment: MinA to rise and steady x 2, pt holding onto L bed rail, utilizing wide BOS for comfort.    ADL: ADL Overall ADL's : Needs assistance/impaired Eating/Feeding: Modified independent,Bed level Grooming: Wash/dry hands,Wash/dry face,Oral care,Moderate assistance,Sitting Grooming Details (indicate cue type and reason): mod A for sitting balance EOB Upper Body Bathing: Moderate assistance,Sitting Upper Body Bathing Details (indicate cue type and reason): mod A for sitting balance EOB Lower Body Bathing: Maximal assistance,Bed level,Sitting/lateral leans Upper Body Dressing : Sitting,Maximal assistance Upper Body Dressing Details (indicate cue type and reason): max A for sitting balance EOB Lower Body Dressing: Maximal assistance,Sitting/lateral leans,Bed level Lower Body Dressing Details (indicate cue type and reason): able to doff Rt sock with mod A for balance, but unable to don it due to significant Lt lateral lean Toilet Transfer: Total assistance Toilet Transfer Details (indicate cue type and reason): unable Toileting- Clothing Manipulation and Hygiene: Total assistance,Sitting/lateral lean,Bed level Functional mobility during ADLs: Maximal assistance,Moderate assistance (bed mobility)   Cognition: Cognition Overall Cognitive Status: Within Functional Limits for tasks assessed Orientation Level: Oriented X4 Cognition Arousal/Alertness: Awake/alert Behavior During Therapy: WFL for tasks assessed/performed Overall Cognitive Status:  Within Functional Limits for tasks assessed General Comments: Descriptive and aware of deficits   Blood pressure (!) 153/93, pulse 62, temperature 98.6 F (37 C), temperature source Oral, resp. rate 20, height 6\' 1"  (1.854 m), weight 120.2 kg, SpO2 98 %. Physical Exam Vitals and nursing note reviewed.  Constitutional:      Appearance: He is obese.  HENT:     Head: Normocephalic and atraumatic.  Eyes:     Extraocular Movements: Extraocular movements intact.     Conjunctiva/sclera: Conjunctivae normal.     Pupils: Pupils are equal, round, and  reactive to light.  Cardiovascular:     Rate and Rhythm: Normal rate and regular rhythm.  Pulmonary:     Effort: Pulmonary effort is normal. No respiratory distress.     Breath sounds: Normal breath sounds. No wheezing.  Abdominal:     General: Abdomen is flat. Bowel sounds are normal. There is no distension.     Palpations: Abdomen is soft.  Musculoskeletal:     Cervical back: Normal range of motion and neck supple.     Comments: There is swelling at the right wrist radial aspect, no bleeding Right upper and right lower limb range of motion not tested secondary to restrictions Left upper extremity left lower extremity range of motion is full  Skin:    General: Skin is warm and dry.  Neurological:     Mental Status: He is alert and oriented to person, place, and time.     Comments: Patient is alert.  Makes eye contact with examiner.  Follows simple commands.  Provides name and age.  Left facial sensation is reduced in V1 V2 V3  Limb sensation intact bilateral upper and lower limbs to light touch.  Motor strength is 5/5 in the left deltoid bicep tricep grip hip flexor knee extensor ankle dorsiflexor Cerebellar dysmetria with past-pointing left finger-nose-finger Left pupil abduction disconjugate during left lateral gaze  Psychiatric:        Mood and Affect: Mood normal.        Behavior: Behavior normal.    Voice is hoarse   Lab Results Last 24 Hours       Results for orders placed or performed during the hospital encounter of 10/17/20 (from the past 24 hour(s))  Glucose, capillary     Status: None    Collection Time: 10/19/20  6:09 AM  Result Value Ref Range    Glucose-Capillary 98 70 - 99 mg/dL  Glucose, capillary     Status: Abnormal    Collection Time: 10/19/20 11:02 AM  Result Value Ref Range    Glucose-Capillary 100 (H) 70 - 99 mg/dL    Comment 1 Notify RN    Glucose, capillary     Status: Abnormal    Collection Time: 10/19/20  3:58 PM  Result Value Ref Range     Glucose-Capillary 101 (H) 70 - 99 mg/dL  Glucose, capillary     Status: Abnormal    Collection Time: 10/19/20  9:05 PM  Result Value Ref Range    Glucose-Capillary 147 (H) 70 - 99 mg/dL       Imaging Results (Last 48 hours)  ECHOCARDIOGRAM COMPLETE   Result Date: 10/18/2020    ECHOCARDIOGRAM REPORT   Patient Name:   Dennis Rowser Sr. Date of Exam: 10/18/2020 Medical Rec #:  12/18/2020        Height:       73.0 in Accession #:    [id number]  Weight:       265.0 lb Date of Birth:  03-10-1956        BSA:          2.425 m Patient Age:    65 years         BP:           117/52 mmHg Patient Gender: M                HR:           67 bpm. Exam Location:  Inpatient Procedure: 2D Echo Indications:    TIA  History:        Patient has no prior history of Echocardiogram examinations.                 Risk Factors:Hypertension and Dyslipidemia.  Sonographer:    Delcie Roch Referring Phys: [id number] JENNIFER YATES IMPRESSIONS  1. Left ventricular ejection fraction, by estimation, is 60 to 65%. The left ventricle has normal function. The left ventricle has no regional wall motion abnormalities. There is mild left ventricular hypertrophy. Left ventricular diastolic parameters are consistent with Grade I diastolic dysfunction (impaired relaxation).  2. Right ventricular systolic function is normal. The right ventricular size is normal.  3. The mitral valve is normal in structure. Trivial mitral valve regurgitation. No evidence of mitral stenosis.  4. The aortic valve is tricuspid. There is mild thickening of the aortic valve. Aortic valve regurgitation is not visualized. No aortic stenosis is present. Conclusion(s)/Recommendation(s): No intracardiac source of embolism detected on this transthoracic study. A transesophageal echocardiogram is recommended to exclude cardiac source of embolism if clinically indicated. FINDINGS  Left Ventricle: Left ventricular ejection fraction, by estimation, is 60 to 65%. The left ventricle  has normal function. The left ventricle has no regional wall motion abnormalities. The left ventricular internal cavity size was normal in size. There is  mild left ventricular hypertrophy. Left ventricular diastolic parameters are consistent with Grade I diastolic dysfunction (impaired relaxation). Right Ventricle: The right ventricular size is normal. No increase in right ventricular wall thickness. Right ventricular systolic function is normal. Left Atrium: Left atrial size was normal in size. Right Atrium: Right atrial size was normal in size. Pericardium: There is no evidence of pericardial effusion. Mitral Valve: The mitral valve is normal in structure. Trivial mitral valve regurgitation. No evidence of mitral valve stenosis. Tricuspid Valve: The tricuspid valve is normal in structure. Tricuspid valve regurgitation is trivial. No evidence of tricuspid stenosis. Aortic Valve: The aortic valve is tricuspid. There is mild thickening of the aortic valve. Aortic valve regurgitation is not visualized. No aortic stenosis is present. Pulmonic Valve: The pulmonic valve was normal in structure. Pulmonic valve regurgitation is trivial. No evidence of pulmonic stenosis. Aorta: The aortic root is normal in size and structure. Venous: The inferior vena cava was not well visualized. IAS/Shunts: The interatrial septum appears to be lipomatous. No atrial level shunt detected by color flow Doppler.  LEFT VENTRICLE PLAX 2D LVIDd:         4.90 cm  Diastology LVIDs:         3.00 cm  LV e' lateral:   7.51 cm/s LV PW:         1.10 cm  LV E/e' lateral: 12.6 LV IVS:        0.90 cm LVOT diam:     2.40 cm LV SV:         116 LV SV Index:   48 LVOT  Area:     4.52 cm  RIGHT VENTRICLE TAPSE (M-mode): 2.2 cm LEFT ATRIUM             Index       RIGHT ATRIUM           Index LA diam:        4.20 cm 1.73 cm/m  RA Area:     12.30 cm LA Vol (A2C):   77.6 ml 32.00 ml/m RA Volume:   25.30 ml  10.43 ml/m LA Vol (A4C):   54.6 ml 22.52 ml/m LA  Biplane Vol: 65.3 ml 26.93 ml/m  AORTIC VALVE LVOT Vmax:   124.00 cm/s LVOT Vmean:  80.200 cm/s LVOT VTI:    0.256 m  AORTA Ao Root diam: 3.60 cm Ao Asc diam:  3.40 cm MITRAL VALVE                TRICUSPID VALVE MV Area (PHT): 3.08 cm     TR Peak grad:   21.3 mmHg MV Decel Time: 246 msec     TR Vmax:        231.00 cm/s MV E velocity: 94.30 cm/s MV A velocity: 118.00 cm/s  SHUNTS MV E/A ratio:  0.80         Systemic VTI:  0.26 m                             Systemic Diam: 2.40 cm Weston Brass MD Electronically signed by Weston Brass MD Signature Date/Time: 10/18/2020/5:01:51 PM    Final        Assessment/Plan: Diagnosis: 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 Does the need for close, 24 hr/day medical supervision in concert with the patient's rehab needs make it unreasonable for this patient to be served in a less intensive setting? Yes 2. Co-Morbidities requiring supervision/potential complications: History of TIA, morbid obesity, uncontrolled hypertension, urinary incontinence 3. Due to bladder management, bowel management, safety, skin/wound care, disease management, medication administration, pain management, patient education and Currently with external catheter, does the patient require 24 hr/day rehab nursing? Yes 4. Does the patient require coordinated care of a physician, rehab nurse, therapy disciplines of PT, OT, speech to address physical and functional deficits in the context of the above medical diagnosis(es)? Yes Addressing deficits in the following areas: balance, endurance, locomotion, strength, transferring, bowel/bladder control, bathing, dressing, feeding, grooming, toileting, swallowing and psychosocial support 5. Can the patient actively participate in an intensive therapy program of at least 3 hrs of therapy per day at least 5 days per week? Yes 6. The potential for patient to make measurable  gains while on inpatient rehab is excellent 7. Anticipated functional outcomes upon discharge from inpatient rehab are modified independent and supervision  with PT, modified independent and supervision with OT, independent with SLP. 8. Estimated rehab length of stay to reach the above functional goals is: 10 to 14 days 9. Anticipated discharge destination: Home 10. Overall Rehab/Functional Prognosis: good   RECOMMENDATIONS: This patient's condition is appropriate for continued rehabilitative care in the following setting: CIR Patient has agreed to participate in recommended program. Yes Note that insurance prior authorization may be required for reimbursement for recommended care.   Comment: Need to know if there are any stenting procedures planned in the next couple days prior to admission to rehab.  IR report and follow-up are pending  Mcarthur Rossetti Angiulli, PA-C 10/20/2020    "I have personally performed a face to face diagnostic evaluation of this patient.  Additionally, I have reviewed and concur with the physician assistant's documentation above." Erick Colace M.D. [facility] Fellow Am Acad of [employer] Med and Rehab Diplomate Am Board of Electrodiagnostic Med Fellow Am Board of Interventional Pain

## 2020-10-21 NOTE — Progress Notes (Signed)
Nsg Discharge Note  Report given to [facility]. Patient discharged to [facility] 5C04.  Admit Date:  10/17/2020 Discharge date: 10/21/2020   Caryl Bis Sr. to be D/C'd Rehab per MD order.  AVS completed.  Copy for chart, and copy for patient signed, and dated. Patient/caregiver able to verbalize understanding.  Discharge Medication: Allergies as of 10/21/2020   No Known Allergies     Medication List    STOP taking these medications   aspirin EC 81 MG tablet Replaced by: aspirin 325 MG tablet     TAKE these medications   acetaminophen 650 MG CR tablet Commonly known as: TYLENOL Take 1,300 mg by mouth in the morning and at bedtime.   aspirin 325 MG tablet Take 1 tablet (325 mg total) by mouth daily. Replaces: aspirin EC 81 MG tablet   Calcium Carb-Cholecalciferol 500-400 MG-UNIT Tabs Take 1 tablet by mouth daily.   cholecalciferol 25 MCG (1000 UNIT) tablet Commonly known as: VITAMIN D Take 1,000 Units by mouth daily.   clopidogrel 75 MG tablet Commonly known as: PLAVIX Take 1 tablet (75 mg total) by mouth daily.   finasteride 5 MG tablet Commonly known as: PROSCAR Take 5 mg by mouth daily.   gabapentin 300 MG capsule Commonly known as: NEURONTIN Take 300 mg by mouth daily.   lisinopril 20 MG tablet Commonly known as: ZESTRIL Take 20 mg by mouth 2 (two) times daily.   metFORMIN 500 MG tablet Commonly known as: GLUCOPHAGE Take 1 tablet (500 mg total) by mouth 2 (two) times daily. Start taking on: Oct 24, 2020 What changed: These instructions start on Oct 24, 2020. If you are unsure what to do until then, ask your doctor or other care provider.   multivitamin with minerals Tabs tablet Take 1 tablet by mouth daily.   rosuvastatin 40 MG tablet Commonly known as: CRESTOR Take 1 tablet (40 mg total) by mouth at bedtime. What changed:   medication strength  how much to take   tamsulosin 0.4 MG Caps capsule Commonly known as: FLOMAX Take 0.8 mg by mouth daily.    vitamin C 500 MG tablet Commonly known as: ASCORBIC ACID Take 500 mg by mouth daily.       Discharge Assessment: Vitals:   10/21/20 0835 10/21/20 1200  BP: (!) 142/93 (!) 142/85  Pulse: 77 71  Resp: 18 18  Temp: 98.4 F (36.9 C) 98.2 F (36.8 C)  SpO2: 93% 98%   Skin clean, dry and intact without evidence of skin break down, no evidence of skin tears noted. IV catheter discontinued intact. Site without signs and symptoms of complications - no redness or edema noted at insertion site, patient denies c/o pain - only slight tenderness at site.  Dressing with slight pressure applied.  D/c Instructions-Education: Discharge instructions given to patient/family with verbalized understanding. D/c education completed with patient/family including follow up instructions, medication list, d/c activities limitations if indicated, with other d/c instructions as indicated by MD - patient able to verbalize understanding, all questions fully answered. Patient instructed to return to ED, call 911, or call MD for any changes in condition.  Patient escorted via WC, and D/C home via private auto.  Boykin Nearing, RN 10/21/2020 2:11 PM

## 2020-10-21 NOTE — Progress Notes (Signed)
Inpatient Rehab Admissions Coordinator:   Awaiting determination from insurance regarding prior authorization.   Estill Dooms, PT, DPT Admissions Coordinator [phone] 10/21/20  8:27 AM

## 2020-10-21 NOTE — Discharge Summary (Signed)
Physician Discharge Summary  Dennis Strickland. ZOX:[id number] DOB: 07-09-1955 DOA: 10/17/2020  PCP: Ignacia Palma., MD  Admit date: 10/17/2020 Discharge date: 10/21/2020  Admitted From: home Discharge disposition: CIR   Recommendations for Outpatient Follow-Up:   aspirin 325 mg daily and clopidogrel 75 mg daily.  Continue DAPT for 3 months and then Plavix alone given intracranial stenosis/occlusion.   Discharge Diagnosis:   Principal Problem:   Acute CVA (cerebrovascular accident) (HCC) Active Problems:   Hypertension   Dyslipidemia   BPH (benign prostatic hyperplasia)   Impaired glucose tolerance   Vertigo   Occlusion of left vertebral artery    Discharge Condition: Improved.  Diet recommendation: Low sodium, heart healthy/carb mod  Wound care: None.  Code status: Full.   History of Present Illness:  Dennis Strickland. is a 65 y.o. male with medical history significant of HTN; IGT; elevated PSA; TKR 4 months ago; and TIA (02/2020) presenting with neurologic changes.He notices L face and foot tingling and voice changes.   Over the last 1-2 weeks, he has had severe vertigo and was prescribed Meclizine.  Last night, he noticed severe headache that woke him from sleep in his left temple.  Symptoms were similar to prior TIA.  He had an attempt at LP in the ER and this was not successful.  The facial symptoms are always there but currently intensified.  All symptoms are on the left - leg/foot tingling, facial tingling, vertigo, L visual blurriness.  No dysarthria.  His phonal quality is different from usual.  He feels dry/parched right now  Hospital Course by Problem:   CVA -Patient presenting with hypophonia, severe vertigo, and L face and body tingling - MRI: New focus of abnormal diffusion restriction within the left dorsal medulla oblongata, consistent with acute to subacute infarct., s/p cerebral angiogram:  -Echo: Left ventricular ejection fraction, by estimation, is  60 to 65%. The  left ventricle has normal function. The left ventricle has no regional  wall motion abnormalities. There is mild left ventricular hypertrophy.  Left ventricular diastolic parameters  are consistent with Grade I diastolic dysfunction (impaired relaxation -Neurology consult: Dual antiplatelet is recommended for 3 months.  -PT/OT- CIR -asa/plavix  HTN -resume home meds  HLD -Check FLP- 156/38/80/189 -Continue Crestor but increase for goal LDL <70  IGT -A1c is 5.8, indicatinggood control -resume POMetformin in 48 hours -Continue Neurontin  BPH -Continue Proscar  obesity Body mass index is 34.96 kg/m.    Medical Consultants:   Neurology CIR   Discharge Exam:   Vitals:   10/21/20 0403 10/21/20 0835  BP: (!) 143/83 (!) 142/93  Pulse: 73 77  Resp: 16 18  Temp: 97.9 F (36.6 C) 98.4 F (36.9 C)  SpO2: 94% 93%   Vitals:   10/20/20 2001 10/20/20 2338 10/21/20 0403 10/21/20 0835  BP: (!) 141/85 (!) 145/94 (!) 143/83 (!) 142/93  Pulse: 93 83 73 77  Resp: Temp: 99.6 F (37.6 C) 97.9 F (36.6 C) 97.9 F (36.6 C) 98.4 F (36.9 C)  TempSrc: Oral Oral Oral Oral  SpO2: 94% 96% 94% 93%  Weight:      Height:        General exam: Appears calm and comfortable.  The results of significant diagnostics from this hospitalization (including imaging, microbiology, ancillary and laboratory) are listed below for reference.     Procedures and Diagnostic Studies:   MR BRAIN WO CONTRAST  Result Date: 10/18/2020 CLINICAL DATA:  Stroke follow-up EXAM: MRI HEAD WITHOUT CONTRAST TECHNIQUE: Multiplanar, multiecho pulse sequences of the brain and surrounding structures were obtained without intravenous contrast. COMPARISON:  10/17/2020 brain MRI FINDINGS: Brain: There is a new focus of abnormal diffusion restriction within the left dorsal medulla oblongata (5:56). No other diffusion abnormality. No acute hemorrhage. No mass effect. There is  multifocal hyperintense T2-weighted signal within the white matter. Parenchymal volume and CSF spaces are normal. The midline structures are normal. Vascular: Major flow voids are preserved. Skull and upper cervical spine: Normal calvarium and skull base. Visualized upper cervical spine and soft tissues are normal. Sinuses/Orbits:No paranasal sinus fluid levels or advanced mucosal thickening. No mastoid or middle ear effusion. Normal orbits. IMPRESSION: New focus of abnormal diffusion restriction within the left dorsal medulla oblongata, consistent with acute to subacute infarct. Electronically Signed   By: Deatra Robinson M.D.   On: 10/18/2020 01:01   MR BRAIN WO CONTRAST  Result Date: 10/17/2020 CLINICAL DATA:  Initial evaluation for acute stroke. EXAM: MRI HEAD WITHOUT CONTRAST TECHNIQUE: Multiplanar, multiecho pulse sequences of the brain and surrounding structures were obtained without intravenous contrast. COMPARISON:  Prior studies from earlier the same day as well as previous MRI from 03/05/2020. FINDINGS: Brain: Examination degraded by motion artifact. Generalized age-related cerebral atrophy. Patchy T2/FLAIR hyperintensity within the periventricular and deep white matter both cerebral hemispheres most consistent with chronic small vessel ischemic disease, moderate in nature. Superimposed remote lacunar infarct present at the right thalamus. No abnormal foci of restricted diffusion to suggest acute or subacute ischemia. Gray-white matter differentiation maintained. No encephalomalacia to suggest chronic cortical infarction. No evidence for acute or chronic intracranial hemorrhage. No mass lesion, midline shift or mass effect. No hydrocephalus or extra-axial fluid collection. Pituitary gland and suprasellar region within normal limits. Midline structures intact. Vascular: Absent flow void within the left V4 segment, consistent with previously identified occlusion. Major intracranial vascular flow voids are  otherwise maintained. Skull and upper cervical spine: Craniocervical junction within normal limits. Upper cervical spine normal. Bone marrow signal intensity within normal limits. No focal marrow replacing lesion. No scalp soft tissue abnormality. Sinuses/Orbits: Globes orbital soft tissues demonstrate no acute finding. Mild scattered mucosal thickening noted within the ethmoidal air cells and maxillary sinuses. Paranasal sinuses are otherwise clear. No mastoid effusion. Inner ear structures grossly normal. Other: None. IMPRESSION: 1. No acute intracranial abnormality. 2. 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 Overall, appearance is similar as compared to previous MRI from 03/05/2020. Electronically Signed   By: Rise Mu M.D.   On: 10/17/2020 04:23   ECHOCARDIOGRAM COMPLETE  Result Date: 10/18/2020    ECHOCARDIOGRAM REPORT   Patient Name:   Dennis Strickland. Date of Exam: 10/18/2020 Medical Rec #:  [id number]        Height:       73.0 in Accession #:    [id number]       Weight:       265.0 lb Date of Birth:  1955/07/12        BSA:          2.425 m Patient Age:    65 years         BP:           117/52 mmHg Patient Gender: M                HR:  67 bpm. Exam Location:  Inpatient Procedure: 2D Echo Indications:    TIA  History:        Patient has no prior history of Echocardiogram examinations.                 Risk Factors:Hypertension and Dyslipidemia.  Sonographer:    Delcie RochLauren Pennington Referring Phys: [id number] JENNIFER YATES IMPRESSIONS  1. Left ventricular ejection fraction, by estimation, is 60 to 65%. The left ventricle has normal function. The left ventricle has no regional wall motion abnormalities. There is mild left ventricular hypertrophy. Left ventricular diastolic parameters are consistent with Grade I diastolic dysfunction (impaired relaxation).  2. Right ventricular systolic function  is normal. The right ventricular size is normal.  3. The mitral valve is normal in structure. Trivial mitral valve regurgitation. No evidence of mitral stenosis.  4. The aortic valve is tricuspid. There is mild thickening of the aortic valve. Aortic valve regurgitation is not visualized. No aortic stenosis is present. Conclusion(s)/Recommendation(s): No intracardiac source of embolism detected on this transthoracic study. A transesophageal echocardiogram is recommended to exclude cardiac source of embolism if clinically indicated. FINDINGS  Left Ventricle: Left ventricular ejection fraction, by estimation, is 60 to 65%. The left ventricle has normal function. The left ventricle has no regional wall motion abnormalities. The left ventricular internal cavity size was normal in size. There is  mild left ventricular hypertrophy. Left ventricular diastolic parameters are consistent with Grade I diastolic dysfunction (impaired relaxation). Right Ventricle: The right ventricular size is normal. No increase in right ventricular wall thickness. Right ventricular systolic function is normal. Left Atrium: Left atrial size was normal in size. Right Atrium: Right atrial size was normal in size. Pericardium: There is no evidence of pericardial effusion. Mitral Valve: The mitral valve is normal in structure. Trivial mitral valve regurgitation. No evidence of mitral valve stenosis. Tricuspid Valve: The tricuspid valve is normal in structure. Tricuspid valve regurgitation is trivial. No evidence of tricuspid stenosis. Aortic Valve: The aortic valve is tricuspid. There is mild thickening of the aortic valve. Aortic valve regurgitation is not visualized. No aortic stenosis is present. Pulmonic Valve: The pulmonic valve was normal in structure. Pulmonic valve regurgitation is trivial. No evidence of pulmonic stenosis. Aorta: The aortic root is normal in size and structure. Venous: The inferior vena cava was not well visualized.  IAS/Shunts: The interatrial septum appears to be lipomatous. No atrial level shunt detected by color flow Doppler.  LEFT VENTRICLE PLAX 2D LVIDd:         4.90 cm  Diastology LVIDs:         3.00 cm  LV e' lateral:   7.51 cm/s LV PW:         1.10 cm  LV E/e' lateral: 12.6 LV IVS:        0.90 cm LVOT diam:     2.40 cm LV SV:         116 LV SV Index:   48 LVOT Area:     4.52 cm  RIGHT VENTRICLE TAPSE (M-mode): 2.2 cm LEFT ATRIUM             Index       RIGHT ATRIUM           Index LA diam:        4.20 cm 1.73 cm/m  RA Area:     12.30 cm LA Vol (A2C):   77.6 ml 32.00 ml/m RA Volume:   25.30 ml  10.43 ml/m LA  Vol (A4C):   54.6 ml 22.52 ml/m LA Biplane Vol: 65.3 ml 26.93 ml/m  AORTIC VALVE LVOT Vmax:   124.00 cm/s LVOT Vmean:  80.200 cm/s LVOT VTI:    0.256 m  AORTA Ao Root diam: 3.60 cm Ao Asc diam:  3.40 cm MITRAL VALVE                TRICUSPID VALVE MV Area (PHT): 3.08 cm     TR Peak grad:   21.3 mmHg MV Decel Time: 246 msec     TR Vmax:        231.00 cm/s MV E velocity: 94.30 cm/s MV A velocity: 118.00 cm/s  SHUNTS MV E/A ratio:  0.80         Systemic VTI:  0.26 m                             Systemic Diam: 2.40 cm Weston Brass MD Electronically signed by Weston Brass MD Signature Date/Time: 10/18/2020/5:01:51 PM    Final    CT HEAD CODE STROKE WO CONTRAST  Result Date: 10/17/2020 CLINICAL DATA:  Code stroke. Initial evaluation for neuro deficit, stroke suspected. EXAM: CT HEAD WITHOUT CONTRAST TECHNIQUE: Contiguous axial images were obtained from the base of the skull through the vertex without intravenous contrast. COMPARISON:  None. FINDINGS: Brain: Age-related cerebral atrophy with moderate chronic microvascular ischemic disease. Small remote lacunar infarct at the right thalamus. No acute intracranial hemorrhage. No acute large vessel territory infarct. No mass lesion, midline shift or mass effect. No hydrocephalus or extra-axial fluid collection. Vascular: No hyperdense vessel. Skull: Scalp soft  tissues within normal limits.  Calvarium intact. Sinuses/Orbits: Globes and orbital soft tissues demonstrate no acute finding. Paranasal sinuses are largely clear. No mastoid effusion. Other: None. ASPECTS [facility] Stroke Program Early CT Score) - Ganglionic level infarction (caudate, lentiform nuclei, internal capsule, insula, M1-M3 cortex): 7 - Supraganglionic infarction (M4-M6 cortex): 3 Total score (0-10 with 10 being normal): 10 IMPRESSION: 1. No acute intracranial infarct or other abnormality. 2. ASPECTS is 10. 3. Age-related cerebral atrophy with chronic small vessel ischemic disease. These results were communicated to Dr. Wilford Corner at 2:35 amon 5/6/2022by text page via the [facility] messaging system. Electronically Signed   By: Rise Mu M.D.   On: 10/17/2020 02:36   CT ANGIO HEAD NECK W WO CM W PERF (CODE STROKE)  Result Date: 10/17/2020 CLINICAL DATA:  Follow-up examination for acute stroke. EXAM: CT ANGIOGRAPHY HEAD AND NECK CT PERFUSION BRAIN TECHNIQUE: Multidetector CT imaging of the head and neck was performed using the standard protocol during bolus administration of intravenous contrast. Multiplanar CT image reconstructions and MIPs were obtained to evaluate the vascular anatomy. Carotid stenosis measurements (when applicable) are obtained utilizing NASCET criteria, using the distal internal carotid diameter as the denominator. Multiphase CT imaging of the brain was performed following IV bolus contrast injection. Subsequent parametric perfusion maps were calculated using RAPID software. CONTRAST:  75mL OMNIPAQUE IOHEXOL 350 MG/ML SOLN COMPARISON:  Prior head CT from earlier the same day. FINDINGS: CTA NECK FINDINGS Aortic arch: Examination technically limited by motion and timing of the contrast bolus. Visualized aortic arch normal caliber with normal branch pattern. No stenosis about the origin of the great vessels. Right carotid system: Right CCA patent from its origin to the bifurcation.  Mild mixed plaque about the right bifurcation without significant stenosis. Right ICA patent distally without stenosis, dissection or occlusion. Left carotid system: Left CCA  patent from its origin to the bifurcation without stenosis. Mild mixed plaque about the left bifurcation without significant stenosis. Left ICA widely patent distally without stenosis, dissection or occlusion. Vertebral arteries: Both vertebral arteries arise from subclavian arteries. Right vertebral artery dominant. Origin and proximal left vertebral artery not well evaluated due to adjacent venous contamination. Visualized portions of the vertebral arteries patent within the neck without appreciable stenosis or other acute vascular abnormality. Skeleton: No acute osseous abnormality. No discrete or worrisome osseous lesions. Other neck: No other visible acute soft tissue abnormality within the neck. No mass or adenopathy. Upper chest: Visualized upper chest demonstrates no acute finding. Review of the MIP images confirms the above findings CTA HEAD FINDINGS Anterior circulation: Focal severe stenosis involving the horizontal petrous right ICA (series 7, image 127). Petrous left ICA widely patent. Additional scattered plaque within the carotid siphons with no more than mild stenosis elsewhere. A1 segments patent bilaterally. Normal anterior communicating artery complex. Anterior cerebral arteries patent to their distal aspects without appreciable stenosis. No M1 stenosis or occlusion. Normal MCA bifurcations. Distal MCA branches well perfused. Posterior circulation: Dominant right V4 segment patent to the vertebrobasilar junction without stenosis. Right PICA patent. Left vertebral artery largely occludes just as it courses into the cranial vault. Left PICA is perfused at the skull base. Basilar patent to its distal aspect without stenosis. Superior cerebellar arteries patent bilaterally. Both PCAs primarily supplied via the basilar. Multifocal  atheromatous irregularity seen throughout the PCAs bilaterally. Associated multifocal severe left P2 stenoses (series 10, image 21). PCAs otherwise perfused to their distal aspects without proximal stenosis. Venous sinuses: Grossly patent allowing for timing the contrast bolus. Anatomic variants: None significant. No visible aneurysm. Review of the MIP images confirms the above findings CT Brain Perfusion Findings: ASPECTS: 10. CBF (<30%) Volume: 64mL Perfusion (Tmax>6.0s) volume: 96mL Mismatch Volume: 61mL Infarction Location:Negative CT perfusion for acute ischemia. Delayed perfusion seen within the left cerebellar hemisphere related to the occluded left vertebral artery. IMPRESSION: CTA HEAD AND NECK IMPRESSION: 1. Negative CTA for emergent large vessel occlusion. 2. 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 3. Multifocal severe left P2 stenoses. 4. 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 Dominant right vertebral artery widely patent. 5. Mild atheromatous disease about the major arterial vasculature within the neck. No flow-limiting stenosis within the neck. CT PERFUSION IMPRESSION: 1. Negative CT perfusion for acute ischemia. 2. 7 cc perfusion deficit within the left cerebellar hemisphere, in keeping with the occluded left V4 segment. Electronically Signed   By: Rise Mu M.D.   On: 10/17/2020 03:57     Labs:   Basic Metabolic Panel: Recent Labs  Lab 10/17/20 0218 10/17/20 0227  NA 141 142  K 4.1 4.0  CL 106 108  CO2 26  --   GLUCOSE 105* 107*  BUN 24* 24*  CREATININE 1.17 1.10  CALCIUM 9.4  --    GFR Estimated Creatinine Clearance: 89.7 mL/min (by C-G formula based on SCr of 1.1 mg/dL). Liver Function Tests: Recent Labs  Lab 10/17/20 0218  AST 27  ALT 28  ALKPHOS 75  BILITOT 0.4  PROT 7.0  ALBUMIN 4.2   No results for input(s): LIPASE, AMYLASE in the last 168 hours. No results for input(s): AMMONIA in the last  168 hours. Coagulation profile Recent Labs  Lab 10/17/20 0218  INR 1.0    CBC: Recent Labs  Lab 10/17/20 0218 10/17/20 0227  WBC 7.0  --  NEUTROABS 4.4  --   HGB 13.9 13.9  HCT 43.9 41.0  MCV 88.9  --   PLT 151  --    Cardiac Enzymes: No results for input(s): CKTOTAL, CKMB, CKMBINDEX, TROPONINI in the last 168 hours. BNP: Invalid input(s): POCBNP CBG: Recent Labs  Lab 10/20/20 0605 10/20/20 1311 10/20/20 1556 10/20/20 2102 10/21/20 0603  GLUCAP 111* 100* 105* 127* 122*   D-Dimer No results for input(s): DDIMER in the last 72 hours. Hgb A1c No results for input(s): HGBA1C in the last 72 hours. Lipid Profile No results for input(s): CHOL, HDL, LDLCALC, TRIG, CHOLHDL, LDLDIRECT in the last 72 hours. Thyroid function studies No results for input(s): TSH, T4TOTAL, T3FREE, THYROIDAB in the last 72 hours.  Invalid input(s): FREET3 Anemia work up No results for input(s): VITAMINB12, FOLATE, FERRITIN, TIBC, IRON, RETICCTPCT in the last 72 hours. Microbiology Recent Results (from the past 240 hour(s))  SARS CORONAVIRUS 2 (TAT 6-24 HRS) Nasopharyngeal Nasopharyngeal Swab     Status: None   Collection Time: 10/17/20  9:13 AM   Specimen: Nasopharyngeal Swab  Result Value Ref Range Status   SARS Coronavirus 2 NEGATIVE NEGATIVE Final    Comment: (NOTE) SARS-CoV-2 target nucleic acids are NOT DETECTED.  The SARS-CoV-2 RNA is generally detectable in upper and lower respiratory specimens during the acute phase of infection. Negative results do not preclude SARS-CoV-2 infection, do not rule out co-infections with other pathogens, and should not be used as the sole basis for treatment or other patient management decisions. Negative results must be combined with clinical observations, patient history, and epidemiological information. The expected result is Negative.  Fact Sheet for Patients: HairSlick.no  Fact Sheet for Healthcare  Providers: quierodirigir.com  This test is not yet approved or cleared by the [place] FDA and  has been authorized for detection and/or diagnosis of SARS-CoV-2 by FDA under an Emergency Use Authorization (EUA). This EUA will remain  in effect (meaning this test can be used) for the duration of the COVID-19 declaration under Se ction 564(b)(1) of the Act, 21 U.S.C. section 360bbb-3(b)(1), unless the authorization is terminated or revoked sooner.  Performed at [facility], [address] [place], [place] [address]      Discharge Instructions:   Discharge Instructions    Diet Carb Modified   Complete by: As directed    Increase activity slowly   Complete by: As directed    No wound care   Complete by: As directed      Allergies as of 10/21/2020   No Known Allergies     Medication List    STOP taking these medications   aspirin EC 81 MG tablet Replaced by: aspirin 325 MG tablet     TAKE these medications   acetaminophen 650 MG CR tablet Commonly known as: TYLENOL Take 1,300 mg by mouth in the morning and at bedtime.   aspirin 325 MG tablet Take 1 tablet (325 mg total) by mouth daily. Replaces: aspirin EC 81 MG tablet   Calcium Carb-Cholecalciferol 500-400 MG-UNIT Tabs Take 1 tablet by mouth daily.   cholecalciferol 25 MCG (1000 UNIT) tablet Commonly known as: VITAMIN D Take 1,000 Units by mouth daily.   clopidogrel 75 MG tablet Commonly known as: PLAVIX Take 1 tablet (75 mg total) by mouth daily.   finasteride 5 MG tablet Commonly known as: PROSCAR Take 5 mg by mouth daily.   gabapentin 300 MG capsule Commonly known as: NEURONTIN Take 300 mg by mouth daily.  lisinopril 20 MG tablet Commonly known as: ZESTRIL Take 20 mg by mouth 2 (two) times daily.   metFORMIN 500 MG tablet Commonly known as: GLUCOPHAGE Take 1 tablet (500 mg total) by mouth 2 (two) times daily. Start taking on: Oct 24, 2020 What changed:  These instructions start on Oct 24, 2020. If you are unsure what to do until then, ask your doctor or other care provider.   multivitamin with minerals Tabs tablet Take 1 tablet by mouth daily.   rosuvastatin 40 MG tablet Commonly known as: CRESTOR Take 1 tablet (40 mg total) by mouth at bedtime. What changed:   medication strength  how much to take   tamsulosin 0.4 MG Caps capsule Commonly known as: FLOMAX Take 0.8 mg by mouth daily.   vitamin C 500 MG tablet Commonly known as: ASCORBIC ACID Take 500 mg by mouth daily.         Time coordinating discharge: 35 min  Signed:  Joseph Art DO  [facility] 10/21/2020, 10:23 AM

## 2020-10-21 NOTE — Progress Notes (Signed)
Inpatient Rehabilitation Medication Review by a Pharmacist  A complete drug regimen review was completed for this patient to identify any potential clinically significant medication issues.  Clinically significant medication issues were identified:  yes   Type of Medication Issue Identified Description of Issue Urgent (address now) Non-Urgent (address on AM team rounds) Plan Plan Accepted by Provider? (Yes / No / Pending AM Rounds)                                Additional Drug Therapy Needed  Calcium Carb-Cholecalciferol, cholecalciferol 25, lisinopril, metformin, flomax, vit C Non-urgent Message PA   Other         Name of provider notified for urgent issues identified: Deatra Ina, PA  Provider Method of Notification: Secure chat   For non-urgent medication issues to be resolved on team rounds tomorrow morning a [facility] Secure Chat Handoff was sent to:    Pharmacist comments:   Time spent performing this drug regimen review (minutes):  10

## 2020-10-21 NOTE — Progress Notes (Signed)
Inpatient Rehabilitation  Patient information reviewed and entered into eRehab system by Lauren Aguayo M. Kaylynn Chamblin, M.A., CCC/SLP, PPS Coordinator.  Information including medical coding, functional ability and quality indicators will be reviewed and updated through discharge.    

## 2020-10-21 NOTE — Progress Notes (Signed)
Inpatient Rehab Admissions Coordinator:    I have insurance approval and a bed available for pt to admit to CIR today. Dr. Riley Kill in agreement.  Will let pt/family and TOC team know.   Estill Dooms, PT, DPT Admissions Coordinator [phone] 10/21/20  10:03 AM

## 2020-10-21 NOTE — H&P (Signed)
Physical Medicine and Rehabilitation Admission H&P       ZOX:[id number] Dennis Strickland is a 65 year old right-handed male with history of BPH, hyperlipidemia, hypertension, prediabetes, presumed TIA September 2021 maintained on aspirin 81 mg daily.  Per chart review lives with spouse, daughter and grandson.  Multilevel home.  Independent prior to admission.  Patient still works full-time.  Presented 10/17/2020 with left-sided numbness incoordination and headache of acute onset.  Noted systolic blood pressure 180s to 190s.  CT/MRI showed no acute intracranial abnormality.  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  CT angiogram head and neck negative CTA for emergent large vessel occlusion.  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  Multifocal severe left P2 stenosis.  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  Echocardiogram with ejection fraction of 60 to 65% no wall motion abnormalities grade 1 diastolic dysfunction.  Patient did not receive tPA.  Follow-up MRI showed new focus of abnormal diffusion restriction within the left dorsal medulla oblongata consistent with acute to subacute infarction.  Admission chemistries unremarkable except glucose 105 BUN 24 hemoglobin A1c 5.8.  Currently maintained on aspirin 325 mg daily and Plavix 75 mg daily for CVA prophylaxis x3 months.  Subcutaneous Lovenox for DVT prophylaxis.  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  Intracranial atherosclerotic disease.  Therapy evaluations completed due to patient decreased functional ability left side numbness recommendations physical medicine rehab consult patient was admitted for a comprehensive rehab program.   Review of Systems  Constitutional: Negative for chills and fever.  HENT: Negative for hearing loss.    Eyes: Positive for double vision. Negative for blurred vision.  Respiratory: Negative for cough and shortness of breath.   Cardiovascular: Negative for chest pain, palpitations and leg swelling.  Gastrointestinal: Positive for constipation. Negative for heartburn, nausea and vomiting.  Genitourinary: Negative for dysuria, flank pain and hematuria.  Musculoskeletal: Positive for joint pain and myalgias.  Skin: Negative for rash.  Neurological: Positive for dizziness, sensory change, weakness and headaches.  All other systems reviewed and are negative.       Past Medical History:  Diagnosis Date  . BPH (benign prostatic hyperplasia)    . Dyslipidemia    . Hypertension    . Impaired glucose tolerance    . TIA (transient ischemic attack) 02/2020         Past Surgical History:  Procedure Laterality Date  . TOTAL KNEE ARTHROPLASTY             Family History  Problem Relation Age of Onset  . Hypertension Mother    . CVA Sister    . Lupus Sister    . CVA Brother 20    Social History:  reports that he has never smoked. He has never used smokeless tobacco. He reports that he does not drink alcohol and does not use drugs. Allergies: No Known Allergies       Medications Prior to Admission  Medication Sig Dispense Refill  . acetaminophen (TYLENOL) 650 MG CR tablet Take 1,300 mg by mouth in the morning and at bedtime.      Marland Kitchen aspirin EC 81 MG tablet Take 162 mg by mouth in the morning and at bedtime. Swallow whole.      . Calcium Carb-Cholecalciferol 500-400 MG-UNIT TABS Take 1 tablet by mouth daily.      Marland Kitchen  cholecalciferol (VITAMIN D) 25 MCG (1000 UNIT) tablet Take 1,000 Units by mouth daily.      . finasteride (PROSCAR) 5 MG tablet Take 5 mg by mouth daily.      Marland Kitchen gabapentin (NEURONTIN) 300 MG capsule Take 300 mg by mouth daily.      Marland Kitchen lisinopril (ZESTRIL) 20 MG tablet Take 20 mg by mouth 2 (two) times daily.      . metFORMIN (GLUCOPHAGE) 500 MG tablet Take 500 mg by mouth 2 (two)  times daily.      . rosuvastatin (CRESTOR) 10 MG tablet Take 10 mg by mouth at bedtime.      . tamsulosin (FLOMAX) 0.4 MG CAPS capsule Take 0.8 mg by mouth daily.      . vitamin C (ASCORBIC ACID) 500 MG tablet Take 500 mg by mouth daily.          Drug Regimen Review  Drug regimen was reviewed and remains appropriate with no significant issues identified   Home: Home Living Family/patient expects to be discharged to:: Private residence Living Arrangements: Children,Spouse/significant other Available Help at Discharge: Family,Available 24 hours/day Type of Home: House Home Access: Stairs to enter [employer] of Steps: 1 Home Layout: Multi-level Alternate Level Stairs-Number of Steps: 4 Alternate Level Stairs-Rails: Left Bathroom Shower/Tub: Walk-in shower Home Equipment: Bedside commode,Shower seat - built in,Walker - 2 wheels,Cane - single point Additional Comments: Lives with wife   Functional History: Prior Function Level of Independence: Independent 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   Functional Status:  Mobility: Bed Mobility Overal bed mobility: Needs Assistance Bed Mobility: Supine to Sit,Sit to Supine Supine to sit: Min assist Sit to supine: Min assist General bed mobility comments: assist for balance upon moving to EOB and assist to control descent when moving shoulders back toward the bed Transfers Overall transfer level: Needs assistance Equipment used: None Transfers: Sit to/from Stand Sit to Stand: Min assist General transfer comment: MinA to rise and steady x 2, pt holding onto L bed rail, utilizing wide BOS for comfort.   ADL: ADL Overall ADL's : Needs assistance/impaired Eating/Feeding: Modified independent,Bed level Grooming: Wash/dry hands,Wash/dry face,Oral care,Moderate assistance,Sitting Grooming Details (indicate cue type  and reason): mod A for sitting balance EOB Upper Body Bathing: Moderate assistance,Sitting Upper Body Bathing Details (indicate cue type and reason): mod A for sitting balance EOB Lower Body Bathing: Maximal assistance,Bed level,Sitting/lateral leans Upper Body Dressing : Sitting,Maximal assistance Upper Body Dressing Details (indicate cue type and reason): max A for sitting balance EOB Lower Body Dressing: Maximal assistance,Sitting/lateral leans,Bed level Lower Body Dressing Details (indicate cue type and reason): able to doff Rt sock with mod A for balance, but unable to don it due to significant Lt lateral lean Toilet Transfer: Total assistance Toilet Transfer Details (indicate cue type and reason): unable Toileting- Clothing Manipulation and Hygiene: Total assistance,Sitting/lateral lean,Bed level Functional mobility during ADLs: Maximal assistance,Moderate assistance (bed mobility)   Cognition: Cognition Overall Cognitive Status: Within Functional Limits for tasks assessed Orientation Level: Oriented X4 Cognition Arousal/Alertness: Awake/alert Behavior During Therapy: WFL for tasks assessed/performed Overall Cognitive Status: Within Functional Limits for tasks assessed General Comments: Descriptive and aware of deficits   Physical Exam: Blood pressure 140/86, pulse 77, temperature 98.6 F (37 C), temperature source Oral, resp. rate (!) 22, height 6\' 1"  (1.854 m), weight 120.2 kg, SpO2 97 %. Physical Exam Constitutional:      General:  He is not in acute distress.    Appearance: He is obese.  HENT:     Head: Normocephalic and atraumatic.     Nose: Nose normal.     Mouth/Throat:     Mouth: Mucous membranes are moist.  Eyes:     Extraocular Movements: Extraocular movements intact.     Comments: EOMI are grossly intact but he c/o diplopia. Left eyeglass lens taped  Cardiovascular:     Rate and Rhythm: Normal rate and regular rhythm.     Heart sounds: No murmur heard. No  gallop.   Pulmonary:     Effort: Pulmonary effort is normal. No respiratory distress.     Breath sounds: No wheezing.  Abdominal:     General: Bowel sounds are normal. There is no distension.     Tenderness: There is no abdominal tenderness.  Musculoskeletal:        General: No swelling.     Cervical back: Normal range of motion.     Right lower leg: No edema.     Left lower leg: No edema.     Comments: Old TKA scar right knee, full ROM  Skin:    General: Skin is warm.     Coloration: Skin is not jaundiced or pale.  Neurological:     Comments: Alert and oriented x 3. Normal insight and awareness. Intact Memory. Normal language and speech. Diplopia, dysphonia. Left hemi-facial sensory loss. Decreased depth perception with FTN, HTS. Strength nearly 5/5 in UE and LE bilaterally. Sensed pain and light touch in all 4 limbs.   Psychiatric:        Mood and Affect: Mood normal.        Behavior: Behavior normal.        Lab Results Last 48 Hours        Results for orders placed or performed during the hospital encounter of 10/17/20 (from the past 48 hour(s))  Glucose, capillary     Status: Abnormal    Collection Time: 10/18/20  4:02 PM  Result Value Ref Range    Glucose-Capillary 133 (H) 70 - 99 mg/dL      Comment: Glucose reference range applies only to samples taken after fasting for at least 8 hours.    Comment 1 Notify RN    Glucose, capillary     Status: Abnormal    Collection Time: 10/18/20  9:13 PM  Result Value Ref Range    Glucose-Capillary 104 (H) 70 - 99 mg/dL      Comment: Glucose reference range applies only to samples taken after fasting for at least 8 hours.  Glucose, capillary     Status: None    Collection Time: 10/19/20  6:09 AM  Result Value Ref Range    Glucose-Capillary 98 70 - 99 mg/dL      Comment: Glucose reference range applies only to samples taken after fasting for at least 8 hours.  Glucose, capillary     Status: Abnormal    Collection Time: 10/19/20  11:02 AM  Result Value Ref Range    Glucose-Capillary 100 (H) 70 - 99 mg/dL      Comment: Glucose reference range applies only to samples taken after fasting for at least 8 hours.    Comment 1 Notify RN    Glucose, capillary     Status: Abnormal    Collection Time: 10/19/20  3:58 PM  Result Value Ref Range    Glucose-Capillary 101 (H) 70 - 99 mg/dL  Comment: Glucose reference range applies only to samples taken after fasting for at least 8 hours.  Glucose, capillary     Status: Abnormal    Collection Time: 10/19/20  9:05 PM  Result Value Ref Range    Glucose-Capillary 147 (H) 70 - 99 mg/dL      Comment: Glucose reference range applies only to samples taken after fasting for at least 8 hours.  Glucose, capillary     Status: Abnormal    Collection Time: 10/20/20  6:05 AM  Result Value Ref Range    Glucose-Capillary 111 (H) 70 - 99 mg/dL      Comment: Glucose reference range applies only to samples taken after fasting for at least 8 hours.       Imaging Results (Last 48 hours)  ECHOCARDIOGRAM COMPLETE   Result Date: 10/18/2020    ECHOCARDIOGRAM REPORT   Patient Name:   Dennis Pietrzyk Sr. Date of Exam: 10/18/2020 Medical Rec #:  [id number]        Height:       73.0 in Accession #:    [id number]       Weight:       265.0 lb Date of Birth:  10/03/1955        BSA:          2.425 m Patient Age:    65 years         BP:           117/52 mmHg Patient Gender: M                HR:           67 bpm. Exam Location:  Inpatient Procedure: 2D Echo Indications:    TIA  History:        Patient has no prior history of Echocardiogram examinations.                 Risk Factors:Hypertension and Dyslipidemia.  Sonographer:    Delcie Roch Referring Phys: [id number] JENNIFER YATES IMPRESSIONS  1. Left ventricular ejection fraction, by estimation, is 60 to 65%. The left ventricle has normal function. The left ventricle has no regional wall motion abnormalities. There is mild left ventricular hypertrophy. Left  ventricular diastolic parameters are consistent with Grade I diastolic dysfunction (impaired relaxation).  2. Right ventricular systolic function is normal. The right ventricular size is normal.  3. The mitral valve is normal in structure. Trivial mitral valve regurgitation. No evidence of mitral stenosis.  4. The aortic valve is tricuspid. There is mild thickening of the aortic valve. Aortic valve regurgitation is not visualized. No aortic stenosis is present. Conclusion(s)/Recommendation(s): No intracardiac source of embolism detected on this transthoracic study. A transesophageal echocardiogram is recommended to exclude cardiac source of embolism if clinically indicated. FINDINGS  Left Ventricle: Left ventricular ejection fraction, by estimation, is 60 to 65%. The left ventricle has normal function. The left ventricle has no regional wall motion abnormalities. The left ventricular internal cavity size was normal in size. There is  mild left ventricular hypertrophy. Left ventricular diastolic parameters are consistent with Grade I diastolic dysfunction (impaired relaxation). Right Ventricle: The right ventricular size is normal. No increase in right ventricular wall thickness. Right ventricular systolic function is normal. Left Atrium: Left atrial size was normal in size. Right Atrium: Right atrial size was normal in size. Pericardium: There is no evidence of pericardial effusion. Mitral Valve: The mitral valve is normal in structure. Trivial mitral valve regurgitation. No evidence of  mitral valve stenosis. Tricuspid Valve: The tricuspid valve is normal in structure. Tricuspid valve regurgitation is trivial. No evidence of tricuspid stenosis. Aortic Valve: The aortic valve is tricuspid. There is mild thickening of the aortic valve. Aortic valve regurgitation is not visualized. No aortic stenosis is present. Pulmonic Valve: The pulmonic valve was normal in structure. Pulmonic valve regurgitation is trivial. No  evidence of pulmonic stenosis. Aorta: The aortic root is normal in size and structure. Venous: The inferior vena cava was not well visualized. IAS/Shunts: The interatrial septum appears to be lipomatous. No atrial level shunt detected by color flow Doppler.  LEFT VENTRICLE PLAX 2D LVIDd:         4.90 cm  Diastology LVIDs:         3.00 cm  LV e' lateral:   7.51 cm/s LV PW:         1.10 cm  LV E/e' lateral: 12.6 LV IVS:        0.90 cm LVOT diam:     2.40 cm LV SV:         116 LV SV Index:   48 LVOT Area:     4.52 cm  RIGHT VENTRICLE TAPSE (M-mode): 2.2 cm LEFT ATRIUM             Index       RIGHT ATRIUM           Index LA diam:        4.20 cm 1.73 cm/m  RA Area:     12.30 cm LA Vol (A2C):   77.6 ml 32.00 ml/m RA Volume:   25.30 ml  10.43 ml/m LA Vol (A4C):   54.6 ml 22.52 ml/m LA Biplane Vol: 65.3 ml 26.93 ml/m  AORTIC VALVE LVOT Vmax:   124.00 cm/s LVOT Vmean:  80.200 cm/s LVOT VTI:    0.256 m  AORTA Ao Root diam: 3.60 cm Ao Asc diam:  3.40 cm MITRAL VALVE                TRICUSPID VALVE MV Area (PHT): 3.08 cm     TR Peak grad:   21.3 mmHg MV Decel Time: 246 msec     TR Vmax:        231.00 cm/s MV E velocity: 94.30 cm/s MV A velocity: 118.00 cm/s  SHUNTS MV E/A ratio:  0.80         Systemic VTI:  0.26 m                             Systemic Diam: 2.40 cm Dennis Brass MD Electronically signed by Dennis Brass MD Signature Date/Time: 10/18/2020/5:01:51 PM    Final              Medical Problem List and Plan: 1.  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 He is working through [employer] 2.  Antithrombotics: -DVT/anticoagulation: Lovenox             -antiplatelet therapy: Aspirin 325 mg daily and Plavix 75 mg daily x3 months then  Plavix alone 3. Pain Management: Neurontin 300 mg daily 4. Mood: Provide emotional support             -antipsychotic agents: N/A 5. Neuropsych: This patient is capable of making decisions on his own behalf. 6. Skin/Wound Care: Routine skin checks 7. Fluids/Electrolytes/Nutrition: pt has good appetite             -check labs in AM 8.  Hyperlipidemia.  Crestor 9.  Prediabetes.  Hemoglobin A1c 5.8.  SSI 10.  BPH.  Proscar 5 mg daily             -monitor voiding patterns       Charlton Amor, PA-C 10/20/2020  I have personally performed a face to face diagnostic evaluation of this patient and formulated the key components of the plan.  Additionally, I have personally reviewed laboratory data, imaging studies, as well as relevant notes and concur with the physician assistant's documentation above.  The patient's status has not changed from the original H&P.  Any changes in documentation from the acute care chart have been noted above.  Ranelle Oyster, MD, [place] [facility]

## 2020-10-21 NOTE — Progress Notes (Addendum)
PMR Admission Coordinator Pre-Admission Assessment   Patient: Dennis Brassell Sr. is an 65 y.o., male MRN: [id number] DOB: [birth year] Height: 6\' 1"  (185.4 cm) Weight: 120.2 kg   Insurance Information HMO: yes    PPO:      PCP:      IPA:      80/20:      OTHER:  PRIMARY: BCBS Medicare      Policy#:      Subscriber: pt CM Name: [id number]      Phone#: [phone]     Fax#: [phone] Pre-Cert#: [id number] auth for CIR given by [id number] with [facility] Medicare with updates due on 5/18 to fax listed above.     Employer:  Benefits:  Phone #:      Name:  Eff. Date: 06/14/20     Deduct: $0      Out of Pocket Max: $4200 ($824.47)      Life Max: n/a CIR: $335/admit      SNF: 20 full days Outpatient:      Co-Pay: $40/visit Home Health: 100%      Co-Pay:  DME: 80%     Co-Pay: 20% Providers:  SECONDARY:       Policy#:      Phone#:    08/12/20:       Phone#:    The Artist Information Summary" for patients in Inpatient Rehabilitation Facilities with attached "Privacy Act Statement-Health Care Records" was provided and verbally reviewed with: Patient and Family   Emergency Contact Information         Contact Information     Name Relation Home Work [place], Weiser "[name]" Spouse     [phone]         Current Medical History  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 is a 66 y.o. right-handed male with history of BPH hyperlipidemia hypertension, presumed TIA September 2021 maintained on aspirin 81 mg daily. Presented 10/17/2020 with left-sided numbness incoordination and headache of acute onset.  Noted systolic blood pressures 180s to 190s.  CT/MRI showed no acute intracranial abnormality.  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  CT angiogram of head and neck negative CTA for emergent large vessel  occlusion.  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  Multifocal severe left P2 stenosis.  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  Echocardiogram with ejection fraction of 60 to 65% no wall motion abnormalities grade 1 diastolic dysfunction.  Patient did not receive tPA.  Follow-up MRI showed new focus of abnormal diffusion restriction within the left dorsal medulla oblongata consistent with acute to subacute infarction.  Admission chemistries unremarkable aside glucose 105 BUN 24, hemoglobin A1c 5.8.  Currently maintained on aspirin 325 mg daily and Plavix 75 mg daily for CVA prophylaxis.  Subcutaneous Lovenox for DVT prophylaxis.  Awaiting plan for cerebral angiogram.  Therapy evaluations completed due to patient decreased functional mobility left side numbness recommendations of physical medicine rehab consult.   Complete NIHSS TOTAL: 1   Patient's medical record from 12/17/2020 has been reviewed by the rehabilitation admission coordinator and physician.   Past Medical History      Past Medical History:  Diagnosis Date  . BPH (benign prostatic hyperplasia)    . Dyslipidemia    . Hypertension    . Impaired  glucose tolerance    . TIA (transient ischemic attack) 02/2020      Family History   family history includes CVA in his sister; CVA (age of onset: 54) in his brother; Hypertension in his mother; Lupus in his sister.   Prior Rehab/Hospitalizations Has the patient had prior rehab or hospitalizations prior to admission? Yes   Has the patient had major surgery during 100 days prior to admission? Yes              Current Medications   Current Facility-Administered Medications:  .   stroke: mapping our early stages of recovery book, , Does not apply, Once, Jonah Blue, MD .  acetaminophen (TYLENOL) tablet 650 mg, 650 mg, Oral, Q4H PRN **OR** acetaminophen (TYLENOL) 160 MG/5ML solution 650 mg, 650 mg, Per Tube, Q4H PRN **OR**  acetaminophen (TYLENOL) suppository 650 mg, 650 mg, Rectal, Q4H PRN, Jonah Blue, MD .  acetaminophen (TYLENOL) tablet 500 mg, 500 mg, Oral, BID, Jonah Blue, MD, 500 mg at 10/21/20 0902 .  [DISCONTINUED] aspirin suppository 300 mg, 300 mg, Rectal, Daily **OR** aspirin tablet 325 mg, 325 mg, Oral, Daily, Jonah Blue, MD, 325 mg at 10/21/20 0902 .  clopidogrel (PLAVIX) tablet 75 mg, 75 mg, Oral, Daily, Marvel Plan, MD, 75 mg at 10/21/20 0902 .  enoxaparin (LOVENOX) injection 40 mg, 40 mg, Subcutaneous, Q24H, Jonah Blue, MD, 40 mg at 10/20/20 1610 .  feeding supplement (ENSURE ENLIVE / ENSURE PLUS) liquid 237 mL, 237 mL, Oral, BID BM, Vann, Jessica U, DO, 237 mL at 10/21/20 0903 .  finasteride (PROSCAR) tablet 5 mg, 5 mg, Oral, Daily, Jonah Blue, MD, 5 mg at 10/21/20 0902 .  gabapentin (NEURONTIN) capsule 300 mg, 300 mg, Oral, Daily, Jonah Blue, MD, 300 mg at 10/21/20 0903 .  hydrALAZINE (APRESOLINE) injection 5 mg, 5 mg, Intravenous, Q4H PRN, Jonah Blue, MD, 5 mg at 10/17/20 1746 .  insulin aspart (novoLOG) injection 0-9 Units, 0-9 Units, Subcutaneous, TID WC, Jonah Blue, MD, 1 Units at 10/18/20 1838 .  multivitamin with minerals tablet 1 tablet, 1 tablet, Oral, Daily, Marlin Canary U, DO, 1 tablet at 10/21/20 0902 .  rosuvastatin (CRESTOR) tablet 40 mg, 40 mg, Oral, QHS, Marvel Plan, MD, 40 mg at 10/20/20 2203 .  senna-docusate (Senokot-S) tablet 1 tablet, 1 tablet, Oral, QHS PRN, Jonah Blue, MD   Patients Current Diet:     Diet Order                      Diet Carb Modified              Diet regular Room service appropriate? Yes; Fluid consistency: Thin  Diet effective now                      Precautions / Restrictions Precautions Precautions: Fall,Other (comment) Precaution Comments: Diplopia Restrictions Weight Bearing Restrictions: No    Has the patient had 2 or more falls or a fall with injury in the past year? 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 Independent   Indoor Mobility: Did the patient need assistance with walking from room to room (with or without device)? Independent   Stairs: Did the patient need assistance with internal or external stairs (with or without device)? Independent   Functional  Cognition: Did the patient need help planning regular tasks such as shopping or remembering to take medications? Independent   Home Assistive Devices / Equipment Home Equipment: Bedside commode,Shower seat - built in,Walker - 2 wheels,Cane - single point   Prior Device Use: Indicate devices/aids used by the patient prior to current illness, exacerbation or injury? cane following TKA   Current Functional Level Cognition   Overall Cognitive Status: Within Functional Limits for tasks assessed Orientation Level: Oriented X4 General Comments: Descriptive and aware of deficits    Extremity Assessment (includes Sensation/Coordination)   Upper Extremity Assessment: LUE deficits/detail,RUE deficits/detail RUE Deficits / Details: very mild dysmetria RUE Coordination: decreased fine motor,decreased gross motor LUE Deficits / Details: ataxia noted LUE Sensation: decreased proprioception,decreased light touch LUE Coordination: decreased fine motor,decreased gross motor  Lower Extremity Assessment: Defer to PT evaluation RLE Deficits / Details: Strength 5/5 LLE Deficits / Details: Strength 5/5     ADLs   Overall ADL's : Needs assistance/impaired Eating/Feeding: Modified independent,Bed level Grooming: Wash/dry hands,Wash/dry face,Oral care,Moderate assistance,Sitting Grooming Details (indicate cue type and reason): mod A for sitting balance EOB Upper Body Bathing: Moderate assistance,Sitting Upper Body Bathing Details  (indicate cue type and reason): mod A for sitting balance EOB Lower Body Bathing: Maximal assistance,Bed level,Sitting/lateral leans Upper Body Dressing : Sitting,Maximal assistance Upper Body Dressing Details (indicate cue type and reason): max A for sitting balance EOB Lower Body Dressing: Maximal assistance,Sitting/lateral leans,Bed level Lower Body Dressing Details (indicate cue type and reason): able to doff Rt sock with mod A for balance, but unable to don it due to significant Lt lateral lean Toilet Transfer: Total assistance Toilet Transfer Details (indicate cue type and reason): unable Toileting- Clothing Manipulation and Hygiene: Total assistance,Sitting/lateral lean,Bed level Functional mobility during ADLs: Maximal assistance,Moderate assistance (bed mobility)     Mobility   Overal bed mobility: Needs Assistance Bed Mobility: Supine to Sit,Sit to Supine Supine to sit: Min assist Sit to supine: Min assist General bed mobility comments: assist for balance upon moving to EOB and assist to control descent when moving shoulders back toward the bed     Transfers   Overall transfer level: Needs assistance Equipment used: None Transfers: Sit to/from Stand Sit to Stand: Min assist General transfer comment: MinA to rise and steady x 2, pt holding onto L bed rail, utilizing wide BOS for comfort.     Ambulation / Gait / Stairs / Clinical biochemist / Balance Dynamic Sitting Balance Sitting balance - Comments: Pt requires min guard - max a for sitting balance with heavy lt lateral lean Balance Overall balance assessment: Needs assistance Sitting-balance support: Feet supported Sitting balance-Leahy Scale: Poor Sitting balance - Comments: Pt requires min guard - max a for sitting balance with heavy lt lateral lean Postural control: Left lateral lean Standing balance support: Single extremity supported,During functional activity Standing balance-Leahy Scale:  Poor Standing balance comment: reliant on single UE support, wide BOS     Special needs/care consideration Diabetic management yes    Previous Home Environment (from acute therapy documentation) Living Arrangements: Children,Spouse/significant other Available Help at Discharge: Family,Available 24 hours/day Type of Home: House Home Layout: Multi-level Alternate Level Stairs-Rails: Left Alternate Level Stairs-Number of Steps: 4 Home Access: Stairs to enter Secretary/administrator of Steps: 1 Bathroom Shower/Tub: Radiographer, therapeutic Comments: Lives with wife   Discharge Living Setting Plans for Discharge Living Setting: Lives with (comment) (wife and daughter) Type of Home at Discharge: House  Discharge Home Layout: Multi-level,Bed/bath upstairs,Able to live on main level with bedroom/bathroom,Laundry or work area in basement (4 steps to bed/bath, full flight to basement) Alternate Level Stairs-Rails: Left Alternate Level Stairs-Number of Steps: 4 steps to bedroom/bathroom, 14 to basement Discharge Home Access: Stairs to enter Entrance Stairs-Rails: None Entrance Stairs-Number of Steps: 1 Discharge Bathroom Shower/Tub: Walk-in shower Discharge Bathroom Toilet: Handicapped height Discharge Bathroom Accessibility: Yes How Accessible: Accessible via walker Does the patient have any problems obtaining your medications?: No   Social/Family/Support Systems Patient Roles: Spouse Anticipated Caregiver: Tomasita Morrowammy Shark (spouse) Anticipated Caregiver's Contact Information: [phone] Ability/Limitations of Caregiver: min assist Caregiver Availability: 24/7 Discharge Plan Discussed with Primary Caregiver: Yes Is Caregiver In Agreement with Plan?: Yes Does Caregiver/Family have Issues with Lodging/Transportation while Pt is in Rehab?: No   Goals Patient/Family Goal for Rehab: PT/OT supervision, SLP mod I Expected length of stay: 12-16 days Pt/Family Agrees to Admission and willing to  participate: Yes Program Orientation Provided & Reviewed with Pt/Caregiver Including Roles  & Responsibilities: Yes  Barriers to Discharge: Insurance for SNF coverage   Decrease burden of Care through IP rehab admission: n/a   Possible need for SNF placement upon discharge: Not anticipated. Pt with excellent family support.    Patient Condition: [facility], spoken with CM, and spouse. I discussed via phone for inpatient rehabilitation assessment.  Patient will benefit from ongoing PT, OT and SLP, can actively participate in 3 hours of therapy a day 5 days of the week, and can make measurable gains during the admission.  Patient will also benefit from the coordinated team approach during an Inpatient Acute Rehabilitation admission.  The patient will receive intensive therapy as well as Rehabilitation physician, nursing, social worker, and care management interventions.  Due to safety, disease management, medication administration, pain management and patient education the patient requires 24 hour a day rehabilitation nursing.  The patient is currently min assist with mobility and max assist with basic ADLs.  Discharge setting and therapy post discharge at home with home health is anticipated.  Patient has agreed to participate in the Acute Inpatient Rehabilitation Program and will admit today.   Preadmission Screen Completed By:  Stephania Fragminaitlin E Christna Kulick, PT, DPT 10/21/2020 10:03 AM ______________________________________________________________________   Discussed status with Dr. Riley KillSwartz on 10/21/20  at 10:03 AM  and received approval for admission today.   Admission Coordinator:  Stephania FragminCaitlin E Kenzie Flakes, PT, time 10:03 AM Dorna Bloom/Date 10/21/20     Assessment/Plan: Diagnosis: left dorsal medullary infarct 1. Does the need for close, 24 hr/day Medical supervision in concert with the patient's rehab needs make it unreasonable for this patient to be served in a less intensive  setting? Yes 2. Co-Morbidities requiring supervision/potential complications: HTN, BPH 3. Due to bladder management, bowel management, safety, skin/wound care, disease management, medication administration, pain management and patient education, does the patient require 24 hr/day rehab nursing? Yes 4. Does the patient require coordinated care of a physician, rehab nurse, PT, OT, and SLP to address physical and functional deficits in the context of the above medical diagnosis(es)? Yes Addressing deficits in the following areas: balance, endurance, locomotion, strength, transferring, bowel/bladder control, bathing, dressing, feeding, grooming, toileting, cognition and psychosocial support 5. Can the patient actively participate in an intensive therapy program of at least 3 hrs of therapy 5 days a week? Yes 6. The potential for patient to make measurable gains while on inpatient rehab is excellent 7. Anticipated functional outcomes upon discharge from inpatient  rehab: modified independent and supervision PT, modified independent and supervision OT, modified independent SLP 8. Estimated rehab length of stay to reach the above functional goals is: 10-14 days 9. Anticipated discharge destination: Home 10. Overall Rehab/Functional Prognosis: excellent     MD Signature: Ranelle Oyster, MD, [facility] Physical Medicine & Rehabilitation 10/21/2020

## 2020-10-22 DIAGNOSIS — I639 Cerebral infarction, unspecified: Secondary | ICD-10-CM

## 2020-10-22 LAB — CBC WITH DIFFERENTIAL/PLATELET
Abs Immature Granulocytes: 0.02 10*3/uL (ref 0.00–0.07)
Basophils Absolute: 0 10*3/uL (ref 0.0–0.1)
Basophils Relative: 0 %
Eosinophils Absolute: 0.2 10*3/uL (ref 0.0–0.5)
Eosinophils Relative: 2 %
HCT: 46.3 % (ref 39.0–52.0)
Hemoglobin: 14.6 g/dL (ref 13.0–17.0)
Immature Granulocytes: 0 %
Lymphocytes Relative: 15 %
Lymphs Abs: 1.4 10*3/uL (ref 0.7–4.0)
MCH: 28 pg (ref 26.0–34.0)
MCHC: 31.5 g/dL (ref 30.0–36.0)
MCV: 88.7 fL (ref 80.0–100.0)
Monocytes Absolute: 0.7 10*3/uL (ref 0.1–1.0)
Monocytes Relative: 8 %
Neutro Abs: 6.6 10*3/uL (ref 1.7–7.7)
Neutrophils Relative %: 75 %
Platelets: 161 10*3/uL (ref 150–400)
RBC: 5.22 MIL/uL (ref 4.22–5.81)
RDW: 14.1 % (ref 11.5–15.5)
WBC: 8.9 10*3/uL (ref 4.0–10.5)
nRBC: 0 % (ref 0.0–0.2)

## 2020-10-22 LAB — GLUCOSE, CAPILLARY
Glucose-Capillary: 100 mg/dL — ABNORMAL HIGH (ref 70–99)
Glucose-Capillary: 86 mg/dL (ref 70–99)
Glucose-Capillary: 85 mg/dL (ref 70–99)
Glucose-Capillary: 116 mg/dL — ABNORMAL HIGH (ref 70–99)

## 2020-10-22 LAB — COMPREHENSIVE METABOLIC PANEL
ALT: 34 U/L (ref 0–44)
AST: 21 U/L (ref 15–41)
Albumin: 3.6 g/dL (ref 3.5–5.0)
Alkaline Phosphatase: 74 U/L (ref 38–126)
Anion gap: 7 (ref 5–15)
BUN: 17 mg/dL (ref 8–23)
CO2: 28 mmol/L (ref 22–32)
Calcium: 9.4 mg/dL (ref 8.9–10.3)
Chloride: 104 mmol/L (ref 98–111)
Creatinine, Ser: 1.18 mg/dL (ref 0.61–1.24)
GFR, Estimated: >60 mL/min (ref >60)
Glucose, Bld: 109 mg/dL — ABNORMAL HIGH (ref 70–99)
Potassium: 4.9 mmol/L (ref 3.5–5.1)
Sodium: 139 mmol/L (ref 135–145)
Total Bilirubin: 0.4 mg/dL (ref 0.3–1.2)
Total Protein: 6.6 g/dL (ref 6.5–8.1)

## 2020-10-22 MED ORDER — TAMSULOSIN HCL 0.4 MG PO CAPS
0.8000 mg | ORAL_CAPSULE | Freq: Every day | ORAL | Status: DC
  Administered 2020-10-22 – 2020-10-30 (×9): 0.8 mg via ORAL
  Filled 2020-10-22 (×9): qty 2

## 2020-10-22 MED ORDER — LISINOPRIL 20 MG PO TABS
20.0000 mg | ORAL_TABLET | Freq: Every day | ORAL | Status: DC
  Administered 2020-10-22 – 2020-10-31 (×10): 20 mg via ORAL
  Filled 2020-10-22 (×10): qty 1

## 2020-10-22 MED ORDER — VITAMIN D 25 MCG (1000 UNIT) PO TABS
1000.0000 [IU] | ORAL_TABLET | Freq: Every day | ORAL | Status: DC
  Administered 2020-10-22 – 2020-10-31 (×10): 1000 [IU] via ORAL
  Filled 2020-10-22 (×10): qty 1

## 2020-10-22 MED ORDER — METFORMIN HCL 500 MG PO TABS
500.0000 mg | ORAL_TABLET | Freq: Two times a day (BID) | ORAL | Status: DC
  Administered 2020-10-22 – 2020-10-31 (×18): 500 mg via ORAL
  Filled 2020-10-22 (×19): qty 1

## 2020-10-22 NOTE — Evaluation (Signed)
Speech Language Pathology Assessment and Plan  Patient Details  Name: Dennis Reigle Sr. 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 BPH (benign prostatic hyperplasia)   . Dyslipidemia   . Hypertension   . Impaired glucose tolerance   . TIA (transient ischemic attack) 02/2020   Past Surgical History:  Past Surgical History:  Procedure Laterality Date  . IR ANGIO INTRA EXTRACRAN SEL INTERNAL CAROTID BILAT MOD SED  10/20/2020  . IR ANGIO VERTEBRAL SEL SUBCLAVIAN INNOMINATE UNI L MOD SED  10/20/2020  . IR ANGIO VERTEBRAL SEL VERTEBRAL UNI R MOD SED  10/20/2020  . IR US GUIDE VASC ACCESS RIGHT  10/20/2020  . TOTAL KNEE ARTHROPLASTY      Assessment / Plan / Recommendation Clinical Impression Patient presents with a primary voice disorder but with cognitive-linguistic and speech abilities all WNL. RN observed patient to have some coughing when patient taking medications, but SLP did not observe any overt s/s aspiration or penetration when patient consuming thin liquids and regular texture solids. Swallow initiation appeared timely and no changes in patient's vocal quality after PO's. Patient's voice is significantly dysphonic and per patient report, (he is a very good historian), voice has not changed significantly during hospital admission. He denies any pain or soreness in throat. He was not intubated and denies prior vocal impairment and so likely due to medullary CVA. Voice therapy not indicated at this time as patient needs to be evaluated by ENT to determine appropriate course of treatment. SLP is recommending that patient see ENT  as an Outpatient following his CIR stay.    Skilled Therapeutic Interventions          Bedside swallow eval and speech-language eval  SLP Assessment  Patient does not need any further Speech Lanaguage Pathology Services    Recommendations  SLP Diet Recommendations: Thin;Age appropriate regular solids Liquid Administration via: Cup;Straw Medication Administration: Whole meds with liquid Supervision: Patient able to self feed Postural Changes and/or Swallow Maneuvers: Seated upright 90 degrees Oral Care Recommendations: Oral care BID Patient destination: Home Follow up Recommendations: Other (comment) (Outpatient ENT who can determine need for Outpatient ST for voice) Equipment Recommended: None recommended by SLP    SLP Frequency   N/A  SLP Duration  SLP Intensity  SLP Treatment/Interventions  N/A   N/A    N/A   Pain Pain Assessment Pain Scale: 0-10 Pain Score: 0-No pain  Prior Functioning Cognitive/Linguistic Baseline: Within functional limits Type of Home: House  Lives With: Spouse;Daughter;Other (Comment) (Grandson) Available Help at Discharge: Family;Available 24 hours/day Vocation: Full time employment  SLP Evaluation Cognition Overall Cognitive Status: Within Functional Limits for tasks assessed Arousal/Alertness: Awake/alert Orientation Level: Oriented X4 Attention: Focused;Sustained;Divided Focused Attention: Appears intact Sustained Attention: Appears intact Divided Attention: Appears intact Memory: Appears intact Immediate Memory Recall: Sock;Blue;Bed Memory Recall Sock: Without Cue Memory Recall Blue: Without Cue Memory Recall Bed: Without Cue Awareness: Appears intact Problem Solving: Appears intact Executive Function: Reasoning;Sequencing;Initiating;Self Monitoring;Decision Making Reasoning:  Appears intact Sequencing: Appears intact Decision Making: Appears intact Initiating: Appears intact Self Monitoring: Appears intact Safety/Judgment:  Appears intact Comments: Awareness of L lean fluctuates - moves quickly and not always aware of L lean  Comprehension Auditory Comprehension Overall Auditory Comprehension: Appears within functional limits for tasks assessed Expression Expression Primary Mode of Expression: Verbal Verbal Expression Overall Verbal Expression: Appears within functional limits for tasks assessed Oral Motor Oral Motor/Sensory Function Overall Oral Motor/Sensory Function: Within functional limits Motor Speech Overall Motor Speech: Appears within functional limits for tasks assessed Respiration: Within functional limits Phonation: Hoarse;Other (comment) (dysphonic) Articulation: Within functional limitis Intelligibility: Intelligible Motor Planning: Witnin functional limits  Care Tool Care Tool Cognition Expression of Ideas and Wants Expression of Ideas and Wants: Without difficulty (complex and basic) - expresses complex messages without difficulty and with speech that is clear and easy to understand   Understanding Verbal and Non-Verbal Content Understanding Verbal and Non-Verbal Content: Understands (complex and basic) - clear comprehension without cues or repetitions   Memory/Recall Ability *first 3 days only Memory/Recall Ability *first 3 days only: Current season;Location of own room;Staff names and faces;That he or she is in a hospital/hospital unit     Bedside Swallowing Assessment General Date of Onset: 10/17/20 Previous Swallow Assessment: None found Diet Prior to this Study: Regular;Thin liquids Temperature Spikes Noted: No Respiratory Status: Room air History of Recent Intubation: No Behavior/Cognition: Alert;Cooperative;Pleasant mood Oral Cavity - Dentition: Adequate natural dentition Self-Feeding Abilities: Able to feed self Patient Positioning: Upright in bed Baseline Vocal Quality: Hoarse;Other (comment) (dysphonic) Volitional Cough: Strong  Oral Care Assessment   Ice  Chips Ice chips: Not tested Thin Liquid Thin Liquid: Within functional limits Presentation: Self Fed;Straw;Cup Nectar Thick  NT Honey Thick  NT Puree Puree: Not tested Solid Solid: Within functional limits Presentation: Self Fed BSE Assessment Risk for Aspiration Impact on safety and function: No limitations  Short Term Goals: No short term goals set  Refer to Care Plan for Long Term Goals  Recommendations for other services: Neuropsych  Discharge Criteria: Patient will be discharged from SLP if patient refuses treatment 3 consecutive times without medical reason, if treatment goals not met, if there is a change in medical status, if patient makes no progress towards goals or if patient is discharged from hospital.  The above assessment, treatment plan, treatment alternatives and goals were discussed and mutually agreed upon: by patient    Sonia Baller, [place], CCC-SLP 10/22/20 3:49 PM

## 2020-10-22 NOTE — Evaluation (Addendum)
Occupational Therapy Assessment and Plan  Patient Details  Name: Dennis Salguero Sr. 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 BPH (benign prostatic hyperplasia)   . Dyslipidemia   . Hypertension   . Impaired glucose tolerance   . TIA (transient ischemic attack) 02/2020   Past Surgical History:  Past Surgical History:  Procedure Laterality Date  . IR ANGIO INTRA EXTRACRAN SEL INTERNAL CAROTID BILAT MOD SED  10/20/2020  . IR ANGIO VERTEBRAL SEL SUBCLAVIAN INNOMINATE UNI L MOD SED  10/20/2020  . IR ANGIO VERTEBRAL SEL VERTEBRAL UNI R MOD SED  10/20/2020  . IR US GUIDE VASC ACCESS RIGHT  10/20/2020  . TOTAL KNEE ARTHROPLASTY      Assessment & Plan Clinical Impression:   Pt is a 65 y.o. M who presents with hypophonia, severe vertigo, and L face/body tingling. 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 CTA showing left V4 occlusion. Significant PMH: R TKA 4 months ago, HTN, TIA (02/2020). Patient transferred to CIR on 10/21/2020 .    Patient currently requires mod with basic self-care skills secondary to unbalanced muscle activation and decreased coordination, decreased visual acuity and decreased visual perceptual skills and decreased sitting balance, decreased standing balance, decreased postural control and decreased balance strategies.  Prior to hospitalization, patient could complete BADLs and IADLs with independent .  Patient will benefit from skilled intervention to increase independence with basic self-care skills and  increase level of independence with iADL prior to discharge home with care partner.  Anticipate patient will require intermittent supervision and follow up outpatient.  OT - End of Session Activity Tolerance: Tolerates 30+ min activity with multiple rests Endurance Deficit: Yes Endurance Deficit Description: required rest breaks throughout OT Assessment OT Patient demonstrates impairments in the following area(s): Balance;Safety;Endurance;Vision;Motor OT Basic ADL's Functional Problem(s): Bathing;Dressing;Toileting OT Advanced ADL's Functional Problem(s): Simple Meal Preparation;Full Meal Preparation;Light Housekeeping;Laundry OT Transfers Functional Problem(s): Toilet;Tub/Shower OT Plan OT Frequency: 5 out of 7 days OT Treatment/Interventions: Balance/vestibular training;Discharge planning;Self Care/advanced ADL retraining;Therapeutic Activities;Functional mobility training;Patient/family education;Therapeutic Exercise;Visual/perceptual remediation/compensation;DME/adaptive equipment instruction;UE/LE Strength taining/ROM;UE/LE Coordination activities OT Self Feeding Anticipated Outcome(s): mod I OT Basic Self-Care Anticipated Outcome(s): mod I OT Toileting Anticipated Outcome(s): mod I OT Bathroom Transfers Anticipated Outcome(s): mod I OT Recommendation Patient destination: Home Follow Up Recommendations: 24 hour supervision/assistance Equipment Recommended: Tub/shower seat;To be determined   OT Evaluation Precautions/Restrictions  Precautions Precautions: Fall;Other (comment) Precaution Comments: Diplopia, L lean Restrictions Weight Bearing Restrictions: No General Chart Reviewed: Yes Family/Caregiver Present: No Pain Pain Assessment Pain Scale: 0-10 Pain Score: 0-No pain Home Living/Prior Functioning Home Living Family/patient expects to be discharged to:: Private residence Living Arrangements: Spouse/significant other,Children,Other relatives Available Help at  Discharge: Family,Available 24 hours/day Type of Home: House Home Access: Stairs to enter [employer] of Steps: 1 Entrance Stairs-Rails: None Home Layout: Multi-level Alternate Level Stairs-Number of Steps: 4 Alternate Level Stairs-Rails: Left Bathroom Shower/Tub: Multimedia programmer: Handicapped height Bathroom Accessibility: Yes  Lives With: Spouse,Daughter,Other (Comment) (Grandson) IADL History Occupation: 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 Prior Function Level of Independence: Independent  with basic ADLs,Independent with transfers,Independent with homemaking with ambulation,Independent with gait  Able to Take Stairs?: Yes Driving: Yes Vocation: Full time employment Vocation Requirements: working at [employer]ist business Comments: has 3 point cane and RW from previous TIA Vision Baseline Vision/History: Wears glasses (Reading glasses) Wears Glasses: Reading only Patient Visual Report: Diplopia;Nausea/blurring vision with head movement Vision Assessment?: Vision impaired- to be further tested in functional context (Diplopia and blurring of vision noted in functional contexts with some under/overshooting of grasping items and noted nausea. No formal assessment on eval date but will continue to further assess.) Depth Perception: Undershoots;Overshoots Perception  Perception: Within Functional Limits Praxis Praxis: Intact Cognition Overall Cognitive Status: Within Functional Limits for tasks assessed Arousal/Alertness: Awake/alert Orientation Level: Person;Place;Situation Person: Oriented Place: Oriented Situation: Oriented Year: 2022 Month: May Day of the week: Wednesday Memory: Appears intact Immediate Memory Recall: Sock;Blue;Bed Memory Recall Sock: Without Cue Memory Recall Blue: Without Cue Memory Recall Bed: Without Cue Attention: Focused;Sustained;Divided Focused Attention: Appears  intact Sustained Attention: Appears intact Divided Attention: Appears intact Awareness: Appears intact Problem Solving: Appears intact Executive Function: Reasoning;Sequencing;Initiating;Self Monitoring;Decision Making Reasoning: Appears intact Sequencing: Appears intact Decision Making: Appears intact Initiating: Appears intact Self Monitoring: Appears intact Safety/Judgment: Appears intact Comments: Awareness of L lean fluctuates - moves quickly and not always aware of L lean Sensation Sensation Light Touch: Appears Intact Hot/Cold: Appears Intact Proprioception: Appears Intact Stereognosis: Appears Intact Additional Comments: pt reported numbness along L half of anterior face. Also unable to sense temperature on L side of mouth when eating/drinking. Coordination Gross Motor Movements are Fluid and Coordinated: No Fine Motor Movements are Fluid and Coordinated: Yes Coordination and Movement Description: grossly uncoordinated due to L lean, decreased balance.postural control, and diplopia Finger Nose Finger Test: [facility] but slow Heel Shin Test: mild incoordination on LLE Motor  Motor Motor: Abnormal postural alignment and control Motor - Skilled Clinical Observations: grossly uncoordinated due to L lean, decreased balance/postural control, and diplopia  Trunk/Postural Assessment  Cervical Assessment Cervical Assessment: Within Functional Limits Thoracic Assessment Thoracic Assessment: Within Functional Limits Lumbar Assessment Lumbar Assessment: Exceptions to [facility] (posterior pelvic tilt) Postural Control Postural Control: Deficits on evaluation Trunk Control: L lateral lean  Balance Balance Balance Assessed: Yes Static Sitting Balance Static Sitting - Balance Support: Feet supported;Bilateral upper extremity supported Static Sitting - Level of Assistance: 4: Min assist Dynamic Sitting Balance Dynamic Sitting - Balance Support: Feet supported;Bilateral upper extremity  supported Dynamic Sitting - Level of Assistance: 4: Min assist Dynamic Sitting - Balance Activities: Lateral lean/weight shifting;Forward lean/weight shifting;Reaching for objects Static Standing Balance Static Standing - Balance Support: Bilateral upper extremity supported;During functional activity Static Standing - Level of Assistance: 3: Mod assist Dynamic Standing Balance Dynamic Standing - Balance Support: Bilateral upper extremity supported;During functional activity Dynamic Standing - Level of Assistance: 3: Mod assist Dynamic Standing - Balance Activities: Reaching across midline;Reaching for objects Extremity/Trunk Assessment RUE Assessment RUE Assessment: Within Functional Limits LUE Assessment LUE Assessment: Within Functional Limits  Care Tool Care Tool Self Care Eating   Eating Assist Level: Set up assist    Oral Care    Oral Care Assist Level: Set up assist    Bathing   Body parts bathed by patient: Right arm;Left arm;Chest;Abdomen;Front perineal area;Buttocks;Right upper leg;Left upper leg;Right lower leg;Left lower leg;Face     Assist Level: Contact Guard/Touching assist    Upper Body Dressing(including orthotics)   What is the patient wearing?: Pull over shirt   Assist Level: Minimal Assistance - Patient >  75%    Lower Body Dressing (excluding footwear)   What is the patient wearing?: Pants;Underwear/pull up Assist for lower body dressing: Moderate Assistance - Patient 50 - 74%    Putting on/Taking off footwear   What is the patient wearing?: Non-skid slipper socks;Shoes Assist for footwear: Minimal Assistance - Patient > 75%       Care Tool Toileting Toileting activity   Assist for toileting: Moderate Assistance - Patient 50 - 74%     Care Tool Bed Mobility Roll left and right activity   Roll left and right assist level: Supervision/Verbal cueing    Sit to lying activity        Lying to sitting edge of bed activity   Lying to sitting edge of  bed assist level: Supervision/Verbal cueing     Care Tool Transfers Sit to stand transfer   Sit to stand assist level: Moderate Assistance - Patient 50 - 74%    Chair/bed transfer   Chair/bed transfer assist level: Moderate Assistance - Patient 50 - 74%     Toilet transfer         Care Tool Cognition Expression of Ideas and Wants Expression of Ideas and Wants: Without difficulty (complex and basic) - expresses complex messages without difficulty and with speech that is clear and easy to understand   Understanding Verbal and Non-Verbal Content Understanding Verbal and Non-Verbal Content: Understands (complex and basic) - clear comprehension without cues or repetitions   Memory/Recall Ability *first 3 days only Memory/Recall Ability *first 3 days only: Current season;Location of own room;Staff names and faces;That he or she is in a hospital/hospital unit    Refer to Care Plan for [place] 1 OT Short Term Goal 1 (Week 1): Patient will complete toileting with Min A. OT Short Term Goal 2 (Week 1): Patient will complete LB dressing/bathing min A with AE. OT Short Term Goal 3 (Week 1): Patient will complete dynamic sitting balance ADL with no more than min verbal cues to maintain midline posture. OT Short Term Goal 4 (Week 1): Patient will complete stand pivot transfers min A consistently.  Recommendations for other services: Therapeutic Recreation  Outing/community reintegration   Skilled Therapeutic Intervention  Pt received sitting in recliner, agreeable to OT evaluation, and no pain reported - only slightly dizzy and nauseous from diplopia. Pt stated taped glasses on L side helped but needs better glasses from home. Sit>stand min A but significant L lean and occasional pushing noted with pt aware and able to correct with frequent vc's. Stand pivot transfers chair>w/c<>bed mod A for balance and vc's for maintaining midline posture. Squat pivot transfers  w/c<>TTB<>BSC mod A. UB dressing min A, donning non-skid gripper socks progressing from max A to mod A later in session with figure 4 technique. LB dressing (underwear and pants) min A doffing in sitting using lateral leans and mod A donning in stance to pull over hips. Pt showered with CGA due to L lean and vc's for safety. Postural control improved with pt leaning to target on right to decrease pushing tendencies. Pt navigated bathroom/room well self-propelling w/c demoing good problem-solving and spatial awareness. Oral care set up A sititng at sink. W/c<>BSC using Stedy min A for power up into standing but vc's needed to correct L lean. Pt left supine in bed with all needs met, alarm set, and call bell in reach.   ADL ADL Eating: Set up Where Assessed-Eating: Bed level Grooming: Setup Where Assessed-Grooming:  Sitting at sink;Wheelchair Upper Body Bathing: Contact guard Where Assessed-Upper Body Bathing: Shower (TTB) Lower Body Bathing: Contact guard Where Assessed-Lower Body Bathing: Shower (TTB) Upper Body Dressing: Minimal assistance Where Assessed-Upper Body Dressing: Other (Comment) (Sitting in shower (TTB)) Lower Body Dressing: Moderate assistance Where Assessed-Lower Body Dressing: Other (Comment) (Sitting and in stance in shower (TTB, grab bars)) Toileting: Moderate assistance Where Assessed-Toileting: Bedside Commode Toilet Transfer: Moderate assistance Toilet Transfer Method: Sit pivot;Squat pivot Toilet Transfer Equipment: Drop arm bedside commode;Grab bars Social research officer, government: Moderate assistance Social research officer, government Method: Sit pivot;Squat pivot Youth worker: Transfer tub bench;Grab bars Mobility  Bed Mobility Bed Mobility: Sit to Supine Rolling Right: Supervision/verbal cueing Right Sidelying to Sit: Supervision/Verbal cueing Sit to Supine: Supervision/Verbal cueing Transfers Sit to Stand: Moderate Assistance - Patient 50-74% Stand to Sit: Moderate  Assistance - Patient 50-74%   Discharge Criteria: Patient will be discharged from OT if patient refuses treatment 3 consecutive times without medical reason, if treatment goals not met, if there is a change in medical status, if patient makes no progress towards goals or if patient is discharged from hospital.  The above assessment, treatment plan, treatment alternatives and goals were discussed and mutually agreed upon: by patient  Mellissa Kohut 10/22/2020, 2:56 PM

## 2020-10-22 NOTE — Evaluation (Signed)
Physical Therapy Assessment and Plan  Patient Details  Name: Dennis Maclaughlin Sr. 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 BPH (benign prostatic hyperplasia)   . Dyslipidemia   . Hypertension   . Impaired glucose tolerance   . TIA (transient ischemic attack) 02/2020   Past Surgical History:  Past Surgical History:  Procedure Laterality Date  . IR ANGIO INTRA EXTRACRAN SEL INTERNAL CAROTID BILAT MOD SED  10/20/2020  . IR ANGIO VERTEBRAL SEL SUBCLAVIAN INNOMINATE UNI L MOD SED  10/20/2020  . IR ANGIO VERTEBRAL SEL VERTEBRAL UNI R MOD SED  10/20/2020  . IR US GUIDE VASC ACCESS RIGHT  10/20/2020  . TOTAL KNEE ARTHROPLASTY      Assessment & Plan Clinical Impression: Patient is a 65 y.o. year old male with history of BPH hyperlipidemia hypertension, presumed TIA September 2021 maintained on aspirin 81 mg daily. Presented 10/17/2020 with left-sided numbness incoordination and headache of acute onset. Noted systolic blood pressures 431V to 190s. CT/MRI showed no acute intracranial abnormality. 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 CT angiogram of head and neck negative CTA for emergent large vessel occlusion. 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 Multifocal severe left P2 stenosis. 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 Echocardiogram with ejection  fraction of 60 to 65% no wall motion abnormalities grade 1 diastolic dysfunction. Patient did not receive tPA. Follow-up MRI showed new focus of abnormal diffusion restriction within the left dorsal medulla oblongata consistent with acute to subacute infarction. Admission chemistries unremarkable aside glucose 105 BUN 24, hemoglobin A1c 5.8. Currently maintained on aspirin 325 mg daily and Plavix 75 mg daily for CVA prophylaxis. Subcutaneous Lovenox for DVT prophylaxis. Awaiting plan for cerebral angiogram. Therapy evaluations completed due to patient decreased functional mobility left side numbness recommendations of physical medicine rehab consult.  Patient currently requires mod with mobility secondary to muscle weakness, decreased coordination and decreased motor planning, decreased visual acuity and diplopia, decreased midline orientation and decreased motor planning, and decreased sitting balance, decreased standing balance, decreased postural control, hemiplegia and decreased balance strategies.  Prior to hospitalization, patient was independent  with mobility and lived with Spouse,Daughter (46 y/o grandson lives there also) in a House home.  Home access is 1Stairs to enter.  Patient will benefit from skilled PT intervention to maximize safe functional mobility, minimize fall risk and decrease caregiver burden for planned discharge home with 24 hour supervision.  Anticipate patient will benefit from follow up OP at discharge.  PT - End of Session Activity Tolerance: Tolerates 30+ min activity with multiple rests Endurance Deficit: Yes Endurance Deficit Description: required rest breaks throughout PT Assessment Rehab Potential (ACUTE/IP ONLY): Good PT Barriers to Discharge: [place] home environment;Home environment access/layout;Other (comments) PT Barriers to Discharge Comments: 1 STE with 0 rails and 4 steps with L handrail to get to bedroom/bathroom; visual impairments PT Patient  demonstrates impairments in the following area(s): Balance;Endurance;Motor;Pain;Perception;Safety PT Transfers Functional Problem(s): Bed Mobility;Bed to Chair;Car;Furniture PT Locomotion  Functional Problem(s): Ambulation;Wheelchair Mobility;Stairs PT Plan PT Intensity: Minimum of 1-2 x/day ,45 to 90 minutes PT Frequency: 5 out of 7 days PT Duration Estimated Length of Stay: 12-14 days PT Treatment/Interventions: Ambulation/gait training;Discharge planning;Functional mobility training;Therapeutic Activities;Visual/perceptual remediation/compensation;Balance/vestibular training;Disease management/prevention;Neuromuscular re-education;Therapeutic Exercise;Wheelchair propulsion/positioning;Cognitive remediation/compensation;DME/adaptive equipment instruction;Pain management;Splinting/orthotics;UE/LE Strength taining/ROM;Community reintegration;Functional electrical stimulation;Patient/family education;Stair training;UE/LE Coordination activities PT Transfers Anticipated Outcome(s): supervision with LRAD PT Locomotion Anticipated Outcome(s): CGA with LRAD PT Recommendation Follow Up Recommendations: Outpatient PT Patient destination: Home Equipment Recommended: To be determined Equipment Details: has RW and 3 point cane from previous TIA   PT Evaluation Precautions/Restrictions Precautions Precautions: Fall;Other (comment) Precaution Comments: Diplopia, L lean Restrictions Weight Bearing Restrictions: No Pain Pain Assessment Pain Scale: 0-10 Pain Score: 0-No pain Home Living/Prior Functioning Home Living Living Arrangements: Spouse/significant other;Children;Other relatives Available Help at Discharge: Family;Available 24 hours/day Type of Home: House Home Access: Stairs to enter [employer] of Steps: 1 Entrance Stairs-Rails: None Home Layout: Multi-level Alternate Level Stairs-Number of Steps: 4 Alternate Level Stairs-Rails: Left Bathroom Shower/Tub: Walk-in shower  (small threshold to step over and grab bars) Bathroom Toilet: Handicapped height Bathroom Accessibility: Yes  Lives With: Spouse;Daughter (20 y/o grandson lives there also) Prior Function Level of Independence: Independent with basic ADLs;Independent with transfers;Independent with homemaking with ambulation;Independent with gait  Able to Take Stairs?: Yes Driving: Yes Vocation: Full time employment Vocation Requirements: working at Dentist business Comments: has 3 point cane and RW from previous TIA Cognition Overall Cognitive Status: Within Functional Limits for tasks assessed Arousal/Alertness: Awake/alert Orientation Level: Oriented X4 Memory: Appears intact Awareness: Appears intact Problem Solving: Appears intact Safety/Judgment: Appears intact Sensation Sensation Light Touch: Appears Intact Proprioception: Appears Intact Additional Comments: pt reported numbness along L half of anterior face. Also unable to sense temperature on L side of mouth when eating/drinking. Coordination Gross Motor Movements are Fluid and Coordinated: No Fine Motor Movements are Fluid and Coordinated: Yes Coordination and Movement Description: grossly uncoordinated due to L lean, decreased balance.postural control, and diplopia Finger Nose Finger Test: [facility] but slow Heel Shin Test: mild incoordination on LLE Motor  Motor Motor: Abnormal postural alignment and control Motor - Skilled Clinical Observations: grossly uncoordinated due to L lean, decreased balance/postural control, and diplopia  Trunk/Postural Assessment  Cervical Assessment Cervical Assessment: Within Functional Limits Thoracic Assessment Thoracic Assessment: Exceptions to [facility] (minor kyphosis) Lumbar Assessment Lumbar Assessment: Exceptions to [facility] (posterior pelvic tilt) Postural Control Postural Control: Deficits on evaluation Trunk Control: L lateral lean  Balance Balance Balance Assessed: Yes Static Sitting  Balance Static Sitting - Balance Support: Feet supported;Bilateral upper extremity supported Static Sitting - Level of Assistance: 5: Stand by assistance (CGA) Dynamic Sitting Balance Dynamic Sitting - Balance Support: Feet supported;Bilateral upper extremity supported Dynamic Sitting - Level of Assistance: 4: Min assist Static Standing Balance Static Standing - Balance Support: Right upper extremity supported Static Standing - Level of Assistance: 3: Mod assist Dynamic Standing Balance Dynamic Standing - Balance Support: Right upper extremity supported Dynamic Standing - Level of Assistance: 3: Mod assist Extremity Assessment  RLE Assessment RLE Assessment: Exceptions to [facility] General Strength Comments: grossly generalized to 4+/5 LLE Assessment LLE Assessment: Exceptions to [facility] General Strength Comments: grossly generalized to 4+/5  Care Tool Care Tool Bed Mobility Roll left and right activity   Roll left and right assist level: Supervision/Verbal cueing    Sit to lying activity   Sit to lying assist level: Minimal Assistance - Patient > 75%    Lying to sitting edge of  bed activity   Lying to sitting edge of bed assist level: Supervision/Verbal cueing     Care Tool Transfers Sit to stand transfer   Sit to stand assist level: Moderate Assistance - Patient 50 - 74%    Chair/bed transfer   Chair/bed transfer assist level: Moderate Assistance - Patient 50 - 74%     Physiological scientist transfer assist level: Moderate Assistance - Patient 50 - 74%      Care Tool Locomotion Ambulation   Assist level: 2 helpers Assistive device: Other (comment) (R handrail) Max distance: 59f  Walk 10 feet activity   Assist level: 2 helpers Assistive device: Other (comment) (R handrail)   Walk 50 feet with 2 turns activity Walk 50 feet with 2 turns activity did not occur: Safety/medical concerns (L lean, decreased balance, visual impairments)      Walk 150 feet  activity Walk 150 feet activity did not occur: Safety/medical concerns (L lean, decreased balance, visual impairments)      Walk 10 feet on uneven surfaces activity Walk 10 feet on uneven surfaces activity did not occur: Safety/medical concerns (L lean, decreased balance, visual impairments)      Stairs Stair activity did not occur: Safety/medical concerns (L lean, decreased balance, visual impairments)        Walk up/down 1 step activity Walk up/down 1 step or curb (drop down) activity did not occur: Safety/medical concerns (L lean, decreased balance, visual impairments)     Walk up/down 4 steps activity did not occuR: Safety/medical concerns (L lean, decreased balance, visual impairments)  Walk up/down 4 steps activity      Walk up/down 12 steps activity Walk up/down 12 steps activity did not occur: Safety/medical concerns (L lean, decreased balance, visual impairments)      Pick up small objects from floor Pick up small object from the floor (from standing position) activity did not occur: Safety/medical concerns (L lean, decreased balance, visual impairments)      Wheelchair Will patient use wheelchair at discharge?: Yes Type of Wheelchair: Manual   Wheelchair assist level: Supervision/Verbal cueing Max wheelchair distance: 1516f Wheel 50 feet with 2 turns activity   Assist Level: Supervision/Verbal cueing  Wheel 150 feet activity   Assist Level: Supervision/Verbal cueing    Refer to Care Plan for Long Term Goals  SHORT TERM GOAL WEEK 1 PT Short Term Goal 1 (Week 1): pt will transfer bed<>chair with LRAD and min A PT Short Term Goal 2 (Week 1): pt will ambulate 1541fith LRAD and mod A of 1 PT Short Term Goal 3 (Week 1): Pt will initate stair navigation  Recommendations for other services: None   Skilled Therapeutic Intervention Evaluation completed (see details above and below) with education on PT POC and goals and individual treatment initiated with focus on  functional mobility/transfers, generalized strengthening, dynamic standing balance/coordination, ambulation, simulated car transfers, and improved activity tolerance. Received pt supine in bed, pt educated on PT evaluation, CIR policies, and therapy schedule and agreeable. Pt denied any pain during session but reported numbness along L anterior half of face. Pt transferred supine<>sitting EOB with supervision and use of bedrails. Pt with L lateral lean in sitting but aware and able to correct. CGA for static sitting balance and min A for dynamic sitting balance. Pt transferred bed<>WC stand<>pivot without AD and mod A with cues for hand placement and turning technique. Pt performed WC mobility [id number] and 75ft15f  x 1 using BUE and BLE with supervision. Provided pt with legrests for WC and pt performed simulated car transfer without AD and mod A. Cues for safe entry and to avoid holding onto mobile doorframe; however pt reported feeling "disoriented" and reaching for whatever was close by for stability. Pt then ambulated 34f with R handrail and L handheld assist with +2 assist. Pt with strong L lateral lean, narrow BOS and mild scissoring, downward gaze, and decreased bilateral foot clearance with gait. [facility] and transferred WC<>recliner stand<>pivot with mod A. Pt able to void in urinal with set up assist. Concluded session with pt sitting in recliner, needs within reach, and chair pad alarm on. NT present at bedside attending to care. Safety plan updated.   Mobility Bed Mobility Bed Mobility: Rolling Right;Right Sidelying to Sit Rolling Right: Supervision/verbal cueing Right Sidelying to Sit: Supervision/Verbal cueing Transfers Transfers: Sit to Stand;Stand to Sit;Stand Pivot Transfers Sit to Stand: Moderate Assistance - Patient 50-74% Stand to Sit: Moderate Assistance - Patient 50-74% Stand Pivot Transfers: Moderate Assistance - Patient 50 - 74% Stand Pivot Transfer  Details: Verbal cues for precautions/safety;Verbal cues for technique Stand Pivot Transfer Details (indicate cue type and reason): verbal cues for safety when turning and for awareness of L lateral lean Transfer (Assistive device): None Locomotion  Gait Ambulation: Yes Gait Assistance: 2 Helpers Gait Distance (Feet): 25 Feet Assistive device: Other (Comment) (R handrail and L handheld assist) Gait Assistance Details: Verbal cues for gait pattern;Verbal cues for technique;Verbal cues for precautions/safety;Verbal cues for sequencing Gait Assistance Details: verbal cues to widen BOS and safety to correct L lateral lean Gait Gait: Yes Gait Pattern: Impaired Gait Pattern: Decreased trunk rotation;Decreased stride length;Scissoring;Decreased step length - right;Decreased step length - left;Poor foot clearance - left;Poor foot clearance - right;Narrow base of support Gait velocity: decreased Wheelchair Mobility Wheelchair Mobility: Yes Wheelchair Assistance: SChartered loss adjuster Both upper extremities;Both lower extermities Wheelchair Parts Management: Needs assistance Distance: 1540f  Discharge Criteria: Patient will be discharged from PT if patient refuses treatment 3 consecutive times without medical reason, if treatment goals not met, if there is a change in medical status, if patient makes no progress towards goals or if patient is discharged from hospital.  The above assessment, treatment plan, treatment alternatives and goals were discussed and mutually agreed upon: by patient  AnAlfonse AlpersT, DPT  10/22/2020, 12:14 PM

## 2020-10-22 NOTE — Progress Notes (Signed)
Inpatient Rehabilitation Care Coordinator Assessment and Plan Patient Details  Name: Dennis Sortino Sr. 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 BPH (benign prostatic hyperplasia)   . Dyslipidemia   . Hypertension   . Impaired glucose tolerance   . TIA (transient ischemic attack) 02/2020   Past Surgical History:  Past Surgical History:  Procedure Laterality Date  . IR ANGIO INTRA EXTRACRAN SEL INTERNAL CAROTID BILAT MOD SED  10/20/2020  . IR ANGIO VERTEBRAL SEL SUBCLAVIAN INNOMINATE UNI L MOD SED  10/20/2020  . IR ANGIO VERTEBRAL SEL VERTEBRAL UNI R MOD SED  10/20/2020  . IR US GUIDE VASC ACCESS RIGHT  10/20/2020  . TOTAL KNEE ARTHROPLASTY     Social History:  reports that he has never smoked. He has never used smokeless tobacco. He reports that he does not drink alcohol and does not use drugs.  Family / Support Systems Marital Status: Married Patient Roles: Spouse,Parent,Other (Comment) (Owner of business) Spouse/Significant Other: [name] Children: Daughter whom lives with them Other Supports: 51 yo grandson lives with also Anticipated Caregiver: tammy Ability/Limitations of Caregiver: Can provide assist healthy and helps with business Caregiver Availability: 24/7 Family Dynamics: Close knit family who pull together when times of difficulty. Both wife and daughter can assist. 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 He was still recovering from his TKR surgery and was DC from OPPT. He had a light stroke in 02/2020 and recovered from this and is hopeful he will recover again. His main issues are double vision and hoarseness of his voice Recent Psychosocial Issues: recent TKR and hx of TIA last 02/2020 Psychiatric History: No history deferred depression screen do feel he may benefit from seeing neruo-psych while here for coping. Will get input from team Substance Abuse History: No issues  Patient / Family Perceptions, Expectations & Goals Pt/Family understanding of illness & functional limitations: Pt and wife can explain his stroke both have spoken with the MD's and feel they have a good understanding of his treatment plan going forward. Pt asks questions and feels he is being heard. Premorbid pt/family roles/activities: husband, father, grandfather, employer, friend, church member, etc Anticipated changes in roles/activities/participation: resume Pt/family expectations/goals: Pt states: " I want to be able to do for myself, I am hopeful I will improve while here."  Wife states: " I am hopeful he has done well ithe past."  [employer]: None Premorbid Home Care/DME Agencies: Other (Comment) (has rw, cane, bsc tub seat from past surgery) Transportation available at discharge: Wife and family Resource referrals recommended: Neuropsychology  Discharge Planning Living Arrangements: Spouse/significant  other,Children,Other relatives Support Systems: Spouse/significant other,Children,Other relatives,Friends/neighbors,Church/faith community Type of Residence: Private residence [employer] Resources: Media planner (specify) Passenger transport manager) Financial Resources: Science writer Screen Referred: No Living Expenses: Own Money Management: Patient,Spouse Does the patient have any problems obtaining your medications?: No Home Management: Wife Patient/Family Preliminary Plans: Return home with wife, daughter and grandson who can assist him. His main caregiver will be his wife and will need education prior to discharge home. Aware of team evaluating and setting goals today. Care Coordinator Anticipated Follow Up Needs: HH/OP  Clinical Impression Pleasant gentleman who is motivated to improve and regain his independence. His family is very supportive and involved. Aware evaluating today and setting goals and LOS. Will place on neuro-psych list to be seen and work n discharge needs.  Lucy Chris 10/22/2020, 9:52 AM

## 2020-10-22 NOTE — Progress Notes (Signed)
PROGRESS NOTE   Subjective/Complaints: Leaning to left side and falls to this side during muscle testing +diplopia Shares that diet is often unhealthy at home  ROS: +diplopia   Objective:   IR US Guide Vasc Access Right  Result Date: 10/21/2020 INDICATION: Mr. Elaine Middleton Sr. is a 65 year old male with history of hypertension, hyperlipidemia, TIA 02/2020 admitted for transient left temporal headache, vertigo, swallowing difficulty, hoarseness, left-sided numbness and discoordination after 2 sneezes. No tPA given due to mild symptoms. 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 He comes to our service today for a diagnostic cerebral angiogram to evaluate intracranial vasculature. EXAM: ULTRASOUND-GUIDED VASCULAR ACCESS DIAGNOSTIC CEREBRAL ANGIOGRAM COMPARISON:  CT angiogram of the head and neck Oct 17, 2020 MEDICATIONS: No antibiotics. ANESTHESIA/SEDATION: Versed 1.5 mg IV; Fentanyl 50 mcg IV; 5 mg hydralazine Moderate Sedation Time:  88 minutes The patient was continuously monitored during the procedure by the interventional radiology nurse under my direct supervision. CONTRAST:  110 mL of Omnipaque 240 milligram/mL FLUOROSCOPY TIME:  Fluoroscopy Time: 30 minutes 24 seconds (1,400 mGy). COMPLICATIONS: None immediate. TECHNIQUE: Informed written consent was obtained from the patient after a thorough discussion of the procedural risks, benefits and alternatives. All questions were addressed. Maximal Sterile Barrier Technique was utilized including caps, mask, sterile gowns, sterile gloves, sterile drape, hand hygiene and skin antiseptic. A timeout was performed prior to the initiation of the procedure. The right groin was prepped and draped in the usual sterile fashion. Using a micropuncture kit and the modified Seldinger technique,  access was gained to the right common femoral artery and a 5 French sheath was placed. Under fluoroscopy, Berenstein 2 catheter was navigated over a 0.035" Terumo Glidewire into the aortic arch. The catheter was placed into the left subclavian artery . Frontal and lateral angiograms of the neck and skull base were obtained. Attempted catheterization of the cervical left vertebral artery proved unsuccessful due to severe tortuosity. The catheter was then placed into the right common carotid artery. Frontal and lateral angiograms of the neck. Using biplane roadmap, the catheter was advanced into the right internal carotid artery. Frontal and lateral angiograms of the head were obtained. The catheter was then retracted into the innominate artery. Frontal angiograms of the neck were obtained. Attempted catheterization of the right vertebral artery with multiple catheters proved unsuccessful. The catheter was again placed the left subclavian artery. Frontal angiogram of the head was obtained. The catheter was subsequently placed into the left common carotid artery. Frontal and lateral angiograms of the neck were obtained. Under biplane roadmap, the catheter was advanced into the left internal carotid artery. Frontal and lateral angiograms of the head were obtained. Using the modified Seldinger technique and a micropuncture kit, access was gained to the distal right radial artery at the anatomical snuffbox and a 5 French sheath was placed. Slow intra arterial infusion of 5,000 IU heparin, 5 mg Verapamil and 423 mcg nitroglicerin diluted in patient's own blood was performed. No significant fluctuation in patient's blood pressure seen. Then, a right radial artery roadmap was obtained via sheath  side port. Next, a 5 [place] Berenstein 2 catheter was navigated over a 0.035" Terumo Glidewire into the right subclavian artery under fluoroscopic guidance. Frontal and lateral angiograms of the neck were obtained. Under biplane  roadmap, the catheter was advanced into the right vertebral artery. Frontal and lateral views of the head were obtained followed by bilateral magnified oblique views. The catheter was subsequently withdrawn. Right common femoral angiograms with right anterior oblique and lateral views were obtained. Then, a 5 [place] Exoseal was utilized for femoral access closure. Hemostasis obtained after 5 minutes of manual pressure hold. An inflatable band was placed and inflated over the right hand access site. The vascular sheath was withdrawn and the band was slowly deflated until brisk flow was noted through the arteriotomy site. At this point, the band was reinflated with additional 2 cc of air to obtain patent hemostasis. FINDINGS: Left subclavian angiograms: Atherosclerotic changes and increased tortuosity of the left subclavian artery without hemodynamically significant stenosis. There is severe tortuosity of the V1 segment of the left vertebral artery with mild stenosis at its origin. Opacification of the left vertebral artery up to the V3 segment is noted. Faint opacification of the left PICA seen. Right CCA angiograms: Cervical angiograms show normal course and caliber of the visualized right common carotid and internal carotid arteries. Mild atherosclerotic changes at the right carotid bulb without significant stenosis. Right ICA angiograms: Diffuse luminal irregularity of the intracranial vasculature, consistent with intracranial atherosclerotic disease. Focal stenosis is seen at the petrous of approximately 65-70%. Multifocal areas of mild stenosis are seen in the right ACA vascular tree. There is brisk vascular contrast filling of the ACA and MCA vascular trees. No aneurysms or abnormally high-flow, early draining veins are seen. No regions of abnormal hypervascularity are noted. The visualized dural sinuses are patent. Innominate artery angiogram: Severe tortuosity of the innominate, proximal right subclavian and  proximal right common carotid artery without stenosis. Left CCA angiograms: Cervical angiograms show normal course and caliber of the visualized left common carotid and internal carotid arteries. Mild atherosclerotic changes of the left carotid bulb without hemodynamically significant stenosis. Left ICA angiograms: Diffuse luminal irregularity of the intracranial vasculature with multifocal areas of mild stenosis in the cavernous right ICA and left MCA vascular tree. Short segment of severe stenosis is seen at the M3-M4 segment of the left angular branch. There multiple areas of mild-to-moderate stenosis along the left ACA vascular tree. There is brisk vascular contrast filling of the the ACA and MCA vascular trees. No aneurysms or abnormally high-flow, early draining veins are seen. No regions of abnormal hypervascularity are noted. The visualized dural sinuses are patent. Right subclavian angiograms: Increased tortuosity of the V1 segment of the left vertebral artery without hemodynamically significant stenosis. Right vertebral artery angiograms: Luminal irregularities are seen along the intracranial right vertebral artery, right PICA, basilar artery, bilateral superior cerebellar arteries and posterior cerebral arteries consistent with intracranial atherosclerotic disease. There is moderate stenosis at the origin of the right PICA and mild stenosis at the proximal basilar artery. Faint opacification of the very distal aspect of the intracranial left vertebral artery, at the vertebrobasilar junction, is seen by contrast reflux in appear severely stenotic. No opacification beyond a few mm, consistent with occlusion. No aneurysms or abnormally high-flow, early draining veins are seen. No regions of abnormal hypervascularity are noted. The visualized dural sinuses are patent. Right common femoral artery angiograms: Normal course and caliber of the right external iliac, right common femoral artery and femoral  bifurcation. Right radial artery ultrasound and right radial artery angiogram: The caliber of the distal right radial artery is appropriate for angiogram access. The right radial artery and the right ulnar artery have normal course and caliber. No significant anatomical variants noted. PROCEDURE: No intervention performed. IMPRESSION: 1. 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 2. No hemodynamically significant stenosis at the neck. PLAN: Continue follow-up with neurology. In case the right ICA stenosis becomes symptomatic and refractory to medical treatment, intracranial angioplasty and stenting can be performed. Electronically Signed   By: Pedro Earls M.D.   On: 10/21/2020 16:50   IR ANGIO INTRA EXTRACRAN SEL INTERNAL CAROTID BILAT MOD SED  Result Date: 10/21/2020 INDICATION: Mr. Calixto Pavel Sr. is a 65 year old male with history of hypertension, hyperlipidemia, TIA 02/2020 admitted for transient left temporal headache, vertigo, swallowing difficulty, hoarseness, left-sided numbness and discoordination after 2 sneezes. No tPA given due to mild symptoms. 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 He comes to our service today for a diagnostic cerebral angiogram to evaluate intracranial vasculature. EXAM: ULTRASOUND-GUIDED VASCULAR ACCESS DIAGNOSTIC CEREBRAL ANGIOGRAM COMPARISON:  CT angiogram of the head and neck Oct 17, 2020 MEDICATIONS: No antibiotics. ANESTHESIA/SEDATION: Versed 1.5 mg IV; Fentanyl 50 mcg IV; 5 mg hydralazine Moderate Sedation Time:  88 minutes The patient was continuously monitored during the procedure by the interventional radiology nurse under my direct supervision. CONTRAST:   110 mL of Omnipaque 240 milligram/mL FLUOROSCOPY TIME:  Fluoroscopy Time: 30 minutes 24 seconds (1,400 mGy). COMPLICATIONS: None immediate. TECHNIQUE: Informed written consent was obtained from the patient after a thorough discussion of the procedural risks, benefits and alternatives. All questions were addressed. Maximal Sterile Barrier Technique was utilized including caps, mask, sterile gowns, sterile gloves, sterile drape, hand hygiene and skin antiseptic. A timeout was performed prior to the initiation of the procedure. The right groin was prepped and draped in the usual sterile fashion. Using a micropuncture kit and the modified Seldinger technique, access was gained to the right common femoral artery and a 5 French sheath was placed. Under fluoroscopy, Berenstein 2 catheter was navigated over a 0.035" Terumo Glidewire into the aortic arch. The catheter was placed into the left subclavian artery . Frontal and lateral angiograms of the neck and skull base were obtained. Attempted catheterization of the cervical left vertebral artery proved unsuccessful due to severe tortuosity. The catheter was then placed into the right common carotid artery. Frontal and lateral angiograms of the neck. Using biplane roadmap, the catheter was advanced into the right internal carotid artery. Frontal and lateral angiograms of the head were obtained. The catheter was then retracted into the innominate artery. Frontal angiograms of the neck were obtained. Attempted catheterization of the right vertebral artery with multiple catheters proved unsuccessful. The catheter was again placed the left subclavian artery. Frontal angiogram of the head was obtained. The catheter was subsequently placed into the left common carotid artery. Frontal and lateral angiograms of the neck were obtained. Under biplane roadmap, the catheter was advanced into the left internal carotid artery. Frontal and lateral angiograms of the head were obtained.  Using the modified Seldinger technique and a micropuncture kit, access was gained to the distal right radial artery at the anatomical snuffbox and a 5  French sheath was placed. Slow intra arterial infusion of 5,000 IU heparin, 5 mg Verapamil and 701 mcg nitroglicerin diluted in patient's own blood was performed. No significant fluctuation in patient's blood pressure seen. Then, a right radial artery roadmap was obtained via sheath side port. Next, a 5 [place] Berenstein 2 catheter was navigated over a 0.035" Terumo Glidewire into the right subclavian artery under fluoroscopic guidance. Frontal and lateral angiograms of the neck were obtained. Under biplane roadmap, the catheter was advanced into the right vertebral artery. Frontal and lateral views of the head were obtained followed by bilateral magnified oblique views. The catheter was subsequently withdrawn. Right common femoral angiograms with right anterior oblique and lateral views were obtained. Then, a 5 [place] Exoseal was utilized for femoral access closure. Hemostasis obtained after 5 minutes of manual pressure hold. An inflatable band was placed and inflated over the right hand access site. The vascular sheath was withdrawn and the band was slowly deflated until brisk flow was noted through the arteriotomy site. At this point, the band was reinflated with additional 2 cc of air to obtain patent hemostasis. FINDINGS: Left subclavian angiograms: Atherosclerotic changes and increased tortuosity of the left subclavian artery without hemodynamically significant stenosis. There is severe tortuosity of the V1 segment of the left vertebral artery with mild stenosis at its origin. Opacification of the left vertebral artery up to the V3 segment is noted. Faint opacification of the left PICA seen. Right CCA angiograms: Cervical angiograms show normal course and caliber of the visualized right common carotid and internal carotid arteries. Mild atherosclerotic changes  at the right carotid bulb without significant stenosis. Right ICA angiograms: Diffuse luminal irregularity of the intracranial vasculature, consistent with intracranial atherosclerotic disease. Focal stenosis is seen at the petrous of approximately 65-70%. Multifocal areas of mild stenosis are seen in the right ACA vascular tree. There is brisk vascular contrast filling of the ACA and MCA vascular trees. No aneurysms or abnormally high-flow, early draining veins are seen. No regions of abnormal hypervascularity are noted. The visualized dural sinuses are patent. Innominate artery angiogram: Severe tortuosity of the innominate, proximal right subclavian and proximal right common carotid artery without stenosis. Left CCA angiograms: Cervical angiograms show normal course and caliber of the visualized left common carotid and internal carotid arteries. Mild atherosclerotic changes of the left carotid bulb without hemodynamically significant stenosis. Left ICA angiograms: Diffuse luminal irregularity of the intracranial vasculature with multifocal areas of mild stenosis in the cavernous right ICA and left MCA vascular tree. Short segment of severe stenosis is seen at the M3-M4 segment of the left angular branch. There multiple areas of mild-to-moderate stenosis along the left ACA vascular tree. There is brisk vascular contrast filling of the the ACA and MCA vascular trees. No aneurysms or abnormally high-flow, early draining veins are seen. No regions of abnormal hypervascularity are noted. The visualized dural sinuses are patent. Right subclavian angiograms: Increased tortuosity of the V1 segment of the left vertebral artery without hemodynamically significant stenosis. Right vertebral artery angiograms: Luminal irregularities are seen along the intracranial right vertebral artery, right PICA, basilar artery, bilateral superior cerebellar arteries and posterior cerebral arteries consistent with intracranial  atherosclerotic disease. There is moderate stenosis at the origin of the right PICA and mild stenosis at the proximal basilar artery. Faint opacification of the very distal aspect of the intracranial left vertebral artery, at the vertebrobasilar junction, is seen by contrast reflux in appear severely stenotic. No opacification beyond a few mm, consistent with  occlusion. No aneurysms or abnormally high-flow, early draining veins are seen. No regions of abnormal hypervascularity are noted. The visualized dural sinuses are patent. Right common femoral artery angiograms: Normal course and caliber of the right external iliac, right common femoral artery and femoral bifurcation. Right radial artery ultrasound and right radial artery angiogram: The caliber of the distal right radial artery is appropriate for angiogram access. The right radial artery and the right ulnar artery have normal course and caliber. No significant anatomical variants noted. PROCEDURE: No intervention performed. IMPRESSION: 1. 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 2. No hemodynamically significant stenosis at the neck. PLAN: Continue follow-up with neurology. In case the right ICA stenosis becomes symptomatic and refractory to medical treatment, intracranial angioplasty and stenting can be performed. Electronically Signed   By: Pedro Earls M.D.   On: 10/21/2020 16:50   IR ANGIO VERTEBRAL SEL SUBCLAVIAN INNOMINATE UNI L MOD SED  Result Date: 10/21/2020 INDICATION: Mr. Suhail Peloquin Sr. is a 65 year old male with history of hypertension, hyperlipidemia, TIA 02/2020 admitted for transient left temporal headache, vertigo, swallowing difficulty, hoarseness, left-sided numbness and discoordination after 2 sneezes. No tPA given due to mild symptoms. 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 He comes to our service today for a diagnostic cerebral angiogram to evaluate intracranial vasculature. EXAM: ULTRASOUND-GUIDED VASCULAR ACCESS DIAGNOSTIC CEREBRAL ANGIOGRAM COMPARISON:  CT angiogram of the head and neck Oct 17, 2020 MEDICATIONS: No antibiotics. ANESTHESIA/SEDATION: Versed 1.5 mg IV; Fentanyl 50 mcg IV; 5 mg hydralazine Moderate Sedation Time:  88 minutes The patient was continuously monitored during the procedure by the interventional radiology nurse under my direct supervision. CONTRAST:  110 mL of Omnipaque 240 milligram/mL FLUOROSCOPY TIME:  Fluoroscopy Time: 30 minutes 24 seconds (1,400 mGy). COMPLICATIONS: None immediate. TECHNIQUE: Informed written consent was obtained from the patient after a thorough discussion of the procedural risks, benefits and alternatives. All questions were addressed. Maximal Sterile Barrier Technique was utilized including caps, mask, sterile gowns, sterile gloves, sterile drape, hand hygiene and skin antiseptic. A timeout was performed prior to the initiation of the procedure. The right groin was prepped and draped in the usual sterile fashion. Using a micropuncture kit and the modified Seldinger technique, access was gained to the right common femoral artery and a 5 French sheath was placed. Under fluoroscopy, Berenstein 2 catheter was navigated over a 0.035" Terumo Glidewire into the aortic arch. The catheter was placed into the left subclavian artery . Frontal and lateral angiograms of the neck and skull base were obtained. Attempted catheterization of the cervical left vertebral artery proved unsuccessful due to severe tortuosity. The catheter was then placed into the right common carotid artery. Frontal and lateral angiograms of the neck. Using biplane roadmap, the catheter was advanced into the right  internal carotid artery. Frontal and lateral angiograms of the head were obtained. The catheter was then retracted into the innominate artery. Frontal angiograms of the neck were obtained. Attempted catheterization of the right vertebral artery with multiple catheters proved unsuccessful. The catheter was again placed the left subclavian artery. Frontal angiogram of the head was obtained. The catheter was subsequently placed into the left common carotid artery. Frontal and lateral angiograms of the neck were obtained. Under biplane roadmap,  the catheter was advanced into the left internal carotid artery. Frontal and lateral angiograms of the head were obtained. Using the modified Seldinger technique and a micropuncture kit, access was gained to the distal right radial artery at the anatomical snuffbox and a 5 French sheath was placed. Slow intra arterial infusion of 5,000 IU heparin, 5 mg Verapamil and 825 mcg nitroglicerin diluted in patient's own blood was performed. No significant fluctuation in patient's blood pressure seen. Then, a right radial artery roadmap was obtained via sheath side port. Next, a 5 [place] Berenstein 2 catheter was navigated over a 0.035" Terumo Glidewire into the right subclavian artery under fluoroscopic guidance. Frontal and lateral angiograms of the neck were obtained. Under biplane roadmap, the catheter was advanced into the right vertebral artery. Frontal and lateral views of the head were obtained followed by bilateral magnified oblique views. The catheter was subsequently withdrawn. Right common femoral angiograms with right anterior oblique and lateral views were obtained. Then, a 5 [place] Exoseal was utilized for femoral access closure. Hemostasis obtained after 5 minutes of manual pressure hold. An inflatable band was placed and inflated over the right hand access site. The vascular sheath was withdrawn and the band was slowly deflated until brisk flow was noted through the  arteriotomy site. At this point, the band was reinflated with additional 2 cc of air to obtain patent hemostasis. FINDINGS: Left subclavian angiograms: Atherosclerotic changes and increased tortuosity of the left subclavian artery without hemodynamically significant stenosis. There is severe tortuosity of the V1 segment of the left vertebral artery with mild stenosis at its origin. Opacification of the left vertebral artery up to the V3 segment is noted. Faint opacification of the left PICA seen. Right CCA angiograms: Cervical angiograms show normal course and caliber of the visualized right common carotid and internal carotid arteries. Mild atherosclerotic changes at the right carotid bulb without significant stenosis. Right ICA angiograms: Diffuse luminal irregularity of the intracranial vasculature, consistent with intracranial atherosclerotic disease. Focal stenosis is seen at the petrous of approximately 65-70%. Multifocal areas of mild stenosis are seen in the right ACA vascular tree. There is brisk vascular contrast filling of the ACA and MCA vascular trees. No aneurysms or abnormally high-flow, early draining veins are seen. No regions of abnormal hypervascularity are noted. The visualized dural sinuses are patent. Innominate artery angiogram: Severe tortuosity of the innominate, proximal right subclavian and proximal right common carotid artery without stenosis. Left CCA angiograms: Cervical angiograms show normal course and caliber of the visualized left common carotid and internal carotid arteries. Mild atherosclerotic changes of the left carotid bulb without hemodynamically significant stenosis. Left ICA angiograms: Diffuse luminal irregularity of the intracranial vasculature with multifocal areas of mild stenosis in the cavernous right ICA and left MCA vascular tree. Short segment of severe stenosis is seen at the M3-M4 segment of the left angular branch. There multiple areas of mild-to-moderate  stenosis along the left ACA vascular tree. There is brisk vascular contrast filling of the the ACA and MCA vascular trees. No aneurysms or abnormally high-flow, early draining veins are seen. No regions of abnormal hypervascularity are noted. The visualized dural sinuses are patent. Right subclavian angiograms: Increased tortuosity of the V1 segment of the left vertebral artery without hemodynamically significant stenosis. Right vertebral artery angiograms: Luminal irregularities are seen along the intracranial right vertebral artery, right PICA, basilar artery, bilateral superior cerebellar arteries and posterior cerebral arteries consistent with intracranial atherosclerotic disease. There is moderate stenosis at the origin of the  right PICA and mild stenosis at the proximal basilar artery. Faint opacification of the very distal aspect of the intracranial left vertebral artery, at the vertebrobasilar junction, is seen by contrast reflux in appear severely stenotic. No opacification beyond a few mm, consistent with occlusion. No aneurysms or abnormally high-flow, early draining veins are seen. No regions of abnormal hypervascularity are noted. The visualized dural sinuses are patent. Right common femoral artery angiograms: Normal course and caliber of the right external iliac, right common femoral artery and femoral bifurcation. Right radial artery ultrasound and right radial artery angiogram: The caliber of the distal right radial artery is appropriate for angiogram access. The right radial artery and the right ulnar artery have normal course and caliber. No significant anatomical variants noted. PROCEDURE: No intervention performed. IMPRESSION: 1. 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 2. No hemodynamically significant stenosis at the  neck. PLAN: Continue follow-up with neurology. In case the right ICA stenosis becomes symptomatic and refractory to medical treatment, intracranial angioplasty and stenting can be performed. Electronically Signed   By: Pedro Earls M.D.   On: 10/21/2020 16:50   IR ANGIO VERTEBRAL SEL VERTEBRAL UNI R MOD SED  Result Date: 10/21/2020 INDICATION: Mr. Saul Fabiano Sr. is a 65 year old male with history of hypertension, hyperlipidemia, TIA 02/2020 admitted for transient left temporal headache, vertigo, swallowing difficulty, hoarseness, left-sided numbness and discoordination after 2 sneezes. No tPA given due to mild symptoms. 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 He comes to our service today for a diagnostic cerebral angiogram to evaluate intracranial vasculature. EXAM: ULTRASOUND-GUIDED VASCULAR ACCESS DIAGNOSTIC CEREBRAL ANGIOGRAM COMPARISON:  CT angiogram of the head and neck Oct 17, 2020 MEDICATIONS: No antibiotics. ANESTHESIA/SEDATION: Versed 1.5 mg IV; Fentanyl 50 mcg IV; 5 mg hydralazine Moderate Sedation Time:  88 minutes The patient was continuously monitored during the procedure by the interventional radiology nurse under my direct supervision. CONTRAST:  110 mL of Omnipaque 240 milligram/mL FLUOROSCOPY TIME:  Fluoroscopy Time: 30 minutes 24 seconds (1,400 mGy). COMPLICATIONS: None immediate. TECHNIQUE: Informed written consent was obtained from the patient after a thorough discussion of the procedural risks, benefits and alternatives. All questions were addressed. Maximal Sterile Barrier Technique was utilized including caps, mask, sterile gowns, sterile gloves, sterile drape, hand hygiene and skin antiseptic. A timeout was performed prior to the initiation of the procedure. The right groin was prepped and draped in the usual sterile fashion. Using a micropuncture kit  and the modified Seldinger technique, access was gained to the right common femoral artery and a 5 French sheath was placed. Under fluoroscopy, Berenstein 2 catheter was navigated over a 0.035" Terumo Glidewire into the aortic arch. The catheter was placed into the left subclavian artery . Frontal and lateral angiograms of the neck and skull base were obtained. Attempted catheterization of the cervical left vertebral artery proved unsuccessful due to severe tortuosity. The catheter was then placed into the right common carotid artery. Frontal and lateral angiograms of the neck. Using biplane roadmap, the catheter was advanced into the right internal carotid artery. Frontal and lateral angiograms of the head were obtained. The catheter was then retracted into the innominate artery. Frontal angiograms of the neck were obtained. Attempted catheterization of the right vertebral artery with  multiple catheters proved unsuccessful. The catheter was again placed the left subclavian artery. Frontal angiogram of the head was obtained. The catheter was subsequently placed into the left common carotid artery. Frontal and lateral angiograms of the neck were obtained. Under biplane roadmap, the catheter was advanced into the left internal carotid artery. Frontal and lateral angiograms of the head were obtained. Using the modified Seldinger technique and a micropuncture kit, access was gained to the distal right radial artery at the anatomical snuffbox and a 5 French sheath was placed. Slow intra arterial infusion of 5,000 IU heparin, 5 mg Verapamil and 893 mcg nitroglicerin diluted in patient's own blood was performed. No significant fluctuation in patient's blood pressure seen. Then, a right radial artery roadmap was obtained via sheath side port. Next, a 5 [place] Berenstein 2 catheter was navigated over a 0.035" Terumo Glidewire into the right subclavian artery under fluoroscopic guidance. Frontal and lateral angiograms of the  neck were obtained. Under biplane roadmap, the catheter was advanced into the right vertebral artery. Frontal and lateral views of the head were obtained followed by bilateral magnified oblique views. The catheter was subsequently withdrawn. Right common femoral angiograms with right anterior oblique and lateral views were obtained. Then, a 5 [place] Exoseal was utilized for femoral access closure. Hemostasis obtained after 5 minutes of manual pressure hold. An inflatable band was placed and inflated over the right hand access site. The vascular sheath was withdrawn and the band was slowly deflated until brisk flow was noted through the arteriotomy site. At this point, the band was reinflated with additional 2 cc of air to obtain patent hemostasis. FINDINGS: Left subclavian angiograms: Atherosclerotic changes and increased tortuosity of the left subclavian artery without hemodynamically significant stenosis. There is severe tortuosity of the V1 segment of the left vertebral artery with mild stenosis at its origin. Opacification of the left vertebral artery up to the V3 segment is noted. Faint opacification of the left PICA seen. Right CCA angiograms: Cervical angiograms show normal course and caliber of the visualized right common carotid and internal carotid arteries. Mild atherosclerotic changes at the right carotid bulb without significant stenosis. Right ICA angiograms: Diffuse luminal irregularity of the intracranial vasculature, consistent with intracranial atherosclerotic disease. Focal stenosis is seen at the petrous of approximately 65-70%. Multifocal areas of mild stenosis are seen in the right ACA vascular tree. There is brisk vascular contrast filling of the ACA and MCA vascular trees. No aneurysms or abnormally high-flow, early draining veins are seen. No regions of abnormal hypervascularity are noted. The visualized dural sinuses are patent. Innominate artery angiogram: Severe tortuosity of the  innominate, proximal right subclavian and proximal right common carotid artery without stenosis. Left CCA angiograms: Cervical angiograms show normal course and caliber of the visualized left common carotid and internal carotid arteries. Mild atherosclerotic changes of the left carotid bulb without hemodynamically significant stenosis. Left ICA angiograms: Diffuse luminal irregularity of the intracranial vasculature with multifocal areas of mild stenosis in the cavernous right ICA and left MCA vascular tree. Short segment of severe stenosis is seen at the M3-M4 segment of the left angular branch. There multiple areas of mild-to-moderate stenosis along the left ACA vascular tree. There is brisk vascular contrast filling of the the ACA and MCA vascular trees. No aneurysms or abnormally high-flow, early draining veins are seen. No regions of abnormal hypervascularity are noted. The visualized dural sinuses are patent. Right subclavian angiograms: Increased tortuosity of the V1 segment of the left vertebral artery without  hemodynamically significant stenosis. Right vertebral artery angiograms: Luminal irregularities are seen along the intracranial right vertebral artery, right PICA, basilar artery, bilateral superior cerebellar arteries and posterior cerebral arteries consistent with intracranial atherosclerotic disease. There is moderate stenosis at the origin of the right PICA and mild stenosis at the proximal basilar artery. Faint opacification of the very distal aspect of the intracranial left vertebral artery, at the vertebrobasilar junction, is seen by contrast reflux in appear severely stenotic. No opacification beyond a few mm, consistent with occlusion. No aneurysms or abnormally high-flow, early draining veins are seen. No regions of abnormal hypervascularity are noted. The visualized dural sinuses are patent. Right common femoral artery angiograms: Normal course and caliber of the right external iliac, right  common femoral artery and femoral bifurcation. Right radial artery ultrasound and right radial artery angiogram: The caliber of the distal right radial artery is appropriate for angiogram access. The right radial artery and the right ulnar artery have normal course and caliber. No significant anatomical variants noted. PROCEDURE: No intervention performed. IMPRESSION: 1. 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 2. No hemodynamically significant stenosis at the neck. PLAN: Continue follow-up with neurology. In case the right ICA stenosis becomes symptomatic and refractory to medical treatment, intracranial angioplasty and stenting can be performed. Electronically Signed   By: Pedro Earls M.D.   On: 10/21/2020 16:50   Recent Labs    10/22/20 0507  WBC 8.9  HGB 14.6  HCT 46.3  PLT 161   Recent Labs    10/22/20 0507  NA 139  K 4.9  CL 104  CO2 28  GLUCOSE 109*  BUN 17  CREATININE 1.18  CALCIUM 9.4    Intake/Output Summary (Last 24 hours) at 10/22/2020 0950 Last data filed at 10/22/2020 0700 Gross per 24 hour  Intake 480 ml  Output 2000 ml  Net -1520 ml        Physical Exam: Vital Signs Blood pressure (!) 149/85, pulse 70, temperature 98.5 F (36.9 C), temperature source Oral, resp. rate 18, height 6' 1"  (1.854 m), weight 118.8 kg, SpO2 97 %. Gen: no distress, normal appearing HEENT: oral mucosa pink and moist, NCAT Cardio: Reg rate Chest: normal effort, normal rate of breathing Abd: soft, non-distended Ext: no edema Psych: pleasant, normal affect Skin: intact Musculoskeletal:  General: No swelling.  Cervical back: Normal range of motion.  Right lower leg: No edema.  Left lower leg: No edema.  Comments: Old TKA scar right knee, full ROM Skin: General:  Skin is warm.  Coloration: Skin is not jaundicedor pale.  Neurological:  Comments: Alert and oriented x 3. Normal insight and awareness. Intact Memory. Normal language and speech. Diplopia, dysphonia. Left hemi-facial sensory loss. Decreased depth perception with FTN, HTS. Strength nearly 5/5 in UE and LE bilaterally. Sensed pain and light touch in all 4 limbs.Strong left lateral lean Psychiatric:  Mood and Affect: Moodnormal.  Behavior: Behaviornormal.   Assessment/Plan: 1. Functional deficits which require 3+ hours per day of interdisciplinary therapy in a comprehensive inpatient rehab setting.  Physiatrist is providing close team supervision and 24 hour management of active medical problems listed below.  Physiatrist and rehab team continue to assess barriers to discharge/monitor patient progress toward functional and medical goals  Care Tool:  Bathing              Bathing assist  Upper Body Dressing/Undressing Upper body dressing        Upper body assist Assist Level: Moderate Assistance - Patient 50 - 74%    Lower Body Dressing/Undressing Lower body dressing      What is the patient wearing?: Pants     Lower body assist Assist for lower body dressing: Moderate Assistance - Patient 50 - 74%     Toileting Toileting    Toileting assist Assist for toileting: Maximal Assistance - Patient 25 - 49%     Transfers Chair/bed transfer  Transfers assist     Chair/bed transfer assist level: Moderate Assistance - Patient 50 - 74%     Locomotion Ambulation   Ambulation assist              Walk 10 feet activity   Assist           Walk 50 feet activity   Assist           Walk 150 feet activity   Assist           Walk 10 feet on uneven surface  activity   Assist           Wheelchair     Assist               Wheelchair 50 feet with 2 turns activity    Assist             Wheelchair 150 feet activity     Assist          Blood pressure (!) 149/85, pulse 70, temperature 98.5 F (36.9 C), temperature source Oral, resp. rate 18, height 6' 1"  (1.854 m), weight 118.8 kg, SpO2 97 %.    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 He is working through [employer] 2. Impaired mobility: continue Lovenox -antiplatelet therapy: Aspirin 325 mg daily and Plavix 75 mg dailyx3 months then Plavix alone 3. Pain Management:Continue Neurontin 300 mg daily 4. Mood:Provide emotional support -antipsychotic agents: N/A 5. Neuropsych: This patientiscapable of making decisions on hisown behalf. 6. Skin/Wound Care:Routine skin checks 7. Fluids/Electrolytes/Nutrition:pt has good appetite -check labs in AM 8. Hyperlipidemia. Crestor 9. Prediabetes. Hemoglobin A1c 5.8. SSI. Advised no added sugar/bread/pasta/rice and high vegetable/nuts/olive oil diet.  10. BPH. Proscar 5 mg daily -monitor voiding patterns   LOS: 1 days A FACE TO FACE EVALUATION WAS PERFORMED  Martha Clan P Jennae Hakeem 10/22/2020, 9:50 AM

## 2020-10-22 NOTE — Progress Notes (Signed)
[facility] Individual Statement of Services  Patient Name:  Dennis Gasper Sr.  Date:  10/22/2020  Welcome to the [facility].  Our goal is to provide you with an individualized program based on your diagnosis and situation, designed to meet your specific needs.  With this comprehensive rehabilitation program, you will be expected to participate in at least 3 hours of rehabilitation therapies Monday-Friday, with modified therapy programming on the weekends.  Your rehabilitation program will include the following services:  Physical Therapy (PT), Occupational Therapy (OT), Speech Therapy (ST), 24 hour per day rehabilitation nursing, Neuropsychology, Care Coordinator, Rehabilitation Medicine, Nutrition Services and Pharmacy Services  Weekly team conferences will be held on Tuesday to discuss your progress.  Your Inpatient Rehabilitation Care Coordinator will talk with you frequently to get your input and to update you on team discussions.  Team conferences with you and your family in attendance may also be held.  Expected length of stay: 12-14 days   Overall anticipated outcome: supervision-CGA level  Depending on your progress and recovery, your program may change. Your Inpatient Rehabilitation Care Coordinator will coordinate services and will keep you informed of any changes. Your Inpatient Rehabilitation Care Coordinator's name and contact numbers are listed  below.  The following services may also be recommended but are not provided by the Inpatient Rehabilitation Center:   Driving Evaluations  Home Health Rehabiltiation Services  Outpatient Rehabilitation Services  Vocational Rehabilitation   Arrangements will be made to provide these services after discharge if needed.  Arrangements include referral to agencies that provide these services.  Your insurance has been verified to be:  [employer] Your primary doctor is:  Susa Loffler  Pertinent  information will be shared with your doctor and your insurance company.  Inpatient Rehabilitation Care Coordinator:  Dossie Der, [place] [phone] or ([phone]  Information discussed with and copy given to patient by: Lucy Chris, 10/22/2020, 10:00 AM

## 2020-10-23 LAB — GLUCOSE, CAPILLARY
Glucose-Capillary: 109 mg/dL — ABNORMAL HIGH (ref 70–99)
Glucose-Capillary: 102 mg/dL — ABNORMAL HIGH (ref 70–99)
Glucose-Capillary: 93 mg/dL (ref 70–99)

## 2020-10-23 MED ORDER — ONDANSETRON 4 MG PO TBDP
ORAL_TABLET | ORAL | Status: AC
  Filled 2020-10-23: qty 1

## 2020-10-23 MED ORDER — ONDANSETRON HCL 4 MG PO TABS
4.0000 mg | ORAL_TABLET | Freq: Three times a day (TID) | ORAL | Status: DC | PRN
  Administered 2020-10-23 – 2020-10-29 (×3): 4 mg via ORAL
  Filled 2020-10-23 (×2): qty 1

## 2020-10-23 NOTE — Progress Notes (Signed)
Occupational Therapy Session Note  Patient Details  Name: Dennis Loe Sr. MRN: [id number] Date of Birth: 1955/09/14  Today's Date: 10/23/2020 OT Individual Time: 1000-1100 OT Individual Time Calculation (min): 60 min    Short Term Goals: Week 1:  OT Short Term Goal 1 (Week 1): Patient will complete toileting with Min A. OT Short Term Goal 2 (Week 1): Patient will complete LB dressing/bathing min A with AE PRN. OT Short Term Goal 3 (Week 1): Patient will complete dynamic sitting balance ADL with no more than min verbal cues to maintain midline posture. OT Short Term Goal 4 (Week 1): Patient will complete stand pivot transfers min A consistently.  Skilled Therapeutic Interventions/Progress Updates:    Pt received semifowlers in bed, no pain reported, and agreeable to OT session. Pt declined/reported already completing ADLs this AM. Sup<>sit close spvsn using bed rails. Stand pivot transfer bed > w/c mod A for balance, vc's for L lean. In gym, stand pivot transfer w/c >mat min A. Pt vision further assessed with noted diplopia and blurred vision; decreased convergence of L eye at midline but oculomotor abilities in pursuits [facility]. Pt education on diplopia strategies and visual motor retraining L eye; pt given visual homework and eye patch with wearing schedule/recommendations and demo'd good understanding through teach back; pt questions addressed. Pt engaged in BLE and postural control activities targeting neuro re-education to address L lean and maintaining posture at midline in preparation for functional transfers and independent self-care. Completed in static and dynamic standing/sitting. ~10 sit<>stands performed with LLE on raised block to increase muscle activation of RLE and strategy of placing bilateral hands on R knee when standing to improve L lean. Pt education, visual feedback, mod-max vc's, and body mechanics strategies provided. Pt education on squat pivot transfers to improve independence  and safety in transfers due to L lean and unsteadiness on feet; performed squat pivot w/c>bed CGA at end of session. Pt left semifowlers in bed, alarm set, call bell in reach, and all needs met.  Therapy Documentation Precautions:  Precautions Precautions: Fall,Other (comment) Precaution Comments: Diplopia, L lean Restrictions Weight Bearing Restrictions: No Pain: Pain Assessment Pain Scale: 0-10 Pain Score: 0-No pain   Therapy/Group: Individual Therapy  Mellissa Kohut 10/23/2020, 12:18 PM

## 2020-10-23 NOTE — Progress Notes (Signed)
Occupational Therapy Session Note  Patient Details  Name: Dennis Deason Sr. MRN: [id number] Date of Birth: 08-05-1955  Today's Date: 10/23/2020 OT Individual Time: 2778-2423 OT Individual Time Calculation (min): 42 min    Short Term Goals: Week 1:  OT Short Term Goal 1 (Week 1): Patient will complete toileting with Min A. OT Short Term Goal 2 (Week 1): Patient will complete LB dressing/bathing min A with AE PRN. OT Short Term Goal 3 (Week 1): Patient will complete dynamic sitting balance ADL with no more than min verbal cues to maintain midline posture. OT Short Term Goal 4 (Week 1): Patient will complete stand pivot transfers min A consistently.  Skilled Therapeutic Interventions/Progress Updates:    Pt received semi-reclined in bed, denies pain but c/o nausea throughout session, agreeable to therapy. Session focus on dynamic standing balance, midline orientation, functional transfers, and visuoperceptual tasks in prep for improved ADL performance. Squat-pivot with min A to w/c after completing bed mobility close S. W/c transport to and from gym 2/2 time management. Donned B shoes with close S and later complete oral care seated at sink with set-up A. Seated at [facility], completed single user target game with good accuracy locating targets on L side. Additionally, complete Bell Cancellation task with overall 95% accuracy, but demonstrated disorganized search pattern. Discussed use of organized search pattern for locating objects in visually crowded environments, pt verbalized understanding. Squat-pivot with CGA x3 throughout session. Stood to complete visuoconstructive task with RW req intermittent min to mod A for balance 2/2 L lean and no BUE support. Utilized Geologist, engineering for [employer], but pt req consistent VCs to self-correct. Additionally, completed side stepping with RW up and down mat with overall mod A for balance and mod VCs for step length. Finally, completed 10 STS with no AD for  conditioning, minimal L lean upon stand > sit, but no LOB. Squat-pivot back to bed same manner as above.  Pt left sitting EOB with bed alarm engaged, call bell in reach, and all immediate needs met.    Therapy Documentation Precautions:  Precautions Precautions: Fall,Other (comment) Precaution Comments: Diplopia, L lean Restrictions Weight Bearing Restrictions: No  Pain: Pain Assessment Pain Scale: 0-10 Pain Score: 0-No pain ADL: See Care Tool for more details.   Therapy/Group: Individual Therapy  Volanda Napoleon MS, OTR/L  10/23/2020, 12:57 PM

## 2020-10-23 NOTE — Progress Notes (Signed)
PROGRESS NOTE   Subjective/Complaints: Nauseous this morning- zofran ordered, d/ced protein supplements Continued lateral lean Very pleasant +facial numbness, stable  ROS: +diplopia, +facial numbness   Objective:   No results found. Recent Labs    10/22/20 0507  WBC 8.9  HGB 14.6  HCT 46.3  PLT 161   Recent Labs    10/22/20 0507  NA 139  K 4.9  CL 104  CO2 28  GLUCOSE 109*  BUN 17  CREATININE 1.18  CALCIUM 9.4    Intake/Output Summary (Last 24 hours) at 10/23/2020 1225 Last data filed at 10/23/2020 0817 Gross per 24 hour  Intake 810 ml  Output 1525 ml  Net -715 ml        Physical Exam: Vital Signs Blood pressure 136/88, pulse 70, temperature 98.8 F (37.1 C), temperature source Oral, resp. rate 17, height 6\' 1"  (1.854 m), weight 118.8 kg, SpO2 99 %. Gen: no distress, normal appearing HEENT: oral mucosa pink and moist, NCAT Cardio: Reg rate Chest: normal effort, normal rate of breathing Abd: soft, non-distended Ext: no edema Psych: pleasant, normal affect Musculoskeletal:  General: No swelling.  Cervical back: Normal range of motion.  Right lower leg: No edema.  Left lower leg: No edema.  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  Coloration: Skin is not jaundicedor pale.  Neurological:  Comments: Alert and oriented x 3. Normal insight and awareness. Intact Memory. Normal language and speech. Diplopia, dysphonia. Left hemi-facial sensory loss. Decreased depth perception with FTN, HTS. Strength nearly 5/5 in UE and LE bilaterally. Sensed pain and light touch in all 4 limbs.Strong left lateral lean Psychiatric:  Mood and Affect: Moodnormal.  Behavior: Behaviornormal.   Assessment/Plan: 1. Functional deficits which require 3+ hours per day of interdisciplinary therapy in a comprehensive inpatient rehab  setting.  Physiatrist is providing close team supervision and 24 hour management of active medical problems listed below.  Physiatrist and rehab team continue to assess barriers to discharge/monitor patient progress toward functional and medical goals  Care Tool:  Bathing    Body parts bathed by patient: Right arm,Left arm,Chest,Abdomen,Front perineal area,Buttocks,Right upper leg,Left upper leg,Right lower leg,Left lower leg,Face         Bathing assist Assist Level: Contact Guard/Touching assist     Upper Body Dressing/Undressing Upper body dressing   What is the patient wearing?: Pull over shirt    Upper body assist Assist Level: Minimal Assistance - Patient > 75%    Lower Body Dressing/Undressing Lower body dressing      What is the patient wearing?: Pants,Underwear/pull up     Lower body assist Assist for lower body dressing: Moderate Assistance - Patient 50 - 74%     Toileting Toileting    Toileting assist Assist for toileting: Moderate Assistance - Patient 50 - 74%     Transfers Chair/bed transfer  Transfers assist     Chair/bed transfer assist level: Moderate Assistance - Patient 50 - 74%     Locomotion Ambulation   Ambulation assist      Assist level: 2 helpers Assistive device: Other (comment) (R handrail) Max distance: 22ft   Walk 10 feet  activity   Assist     Assist level: 2 helpers Assistive device: Other (comment) (R handrail)   Walk 50 feet activity   Assist Walk 50 feet with 2 turns activity did not occur: Safety/medical concerns (L lean, decreased balance, visual impairments)         Walk 150 feet activity   Assist Walk 150 feet activity did not occur: Safety/medical concerns (L lean, decreased balance, visual impairments)         Walk 10 feet on uneven surface  activity   Assist Walk 10 feet on uneven surfaces activity did not occur: Safety/medical concerns (L lean, decreased balance, visual  impairments)         Wheelchair     Assist Will patient use wheelchair at discharge?: Yes Type of Wheelchair: Manual    Wheelchair assist level: Supervision/Verbal cueing Max wheelchair distance: >164ft    Wheelchair 50 feet with 2 turns activity    Assist        Assist Level: Supervision/Verbal cueing   Wheelchair 150 feet activity     Assist      Assist Level: Supervision/Verbal cueing   Blood pressure 136/88, pulse 70, temperature 98.8 F (37.1 C), temperature source Oral, resp. rate 17, height 6\' 1"  (1.854 m), weight 118.8 kg, SpO2 99 %.    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 He is working through 2. Impaired mobility: continue Lovenox -antiplatelet therapy: Aspirin 325 mg daily and Plavix 75 mg dailyx3 months then Plavix alone 3. Pain Management:Denies d/c Neurontin 300 mg daily 4. Mood:Provide emotional support -antipsychotic agents: N/A 5. Neuropsych: This patientiscapable of making decisions on hisown behalf. 6. Skin/Wound Care:Routine skin checks 7. Fluids/Electrolytes/Nutrition:pt has good appetite, d/c ensure supplements -electrolytes stable, no need to recheck.  8. Hyperlipidemia. Crestor 9. Prediabetes. Hemoglobin A1c 5.8. SSI. Advised no added sugar/bread/pasta/rice and high vegetable/nuts/olive oil diet.  10. BPH. Proscar 5 mg daily -monitor voiding patterns 11. Nausea: zofran prescribed   LOS: 2 days A FACE TO FACE EVALUATION WAS PERFORMED  Dennis Strickland 10/23/2020, 12:25 PM

## 2020-10-23 NOTE — Progress Notes (Signed)
Physical Therapy Session Note  Patient Details  Name: Dennis Garron Sr. MRN: [id number] Date of Birth: 1955/08/20  Today's Date: 10/23/2020 PT Individual Time: [phone] and 1430-1456 PT Individual Time Calculation (min): 53 min and 26 min  Short Term Goals: Week 1:  PT Short Term Goal 1 (Week 1): pt will transfer bed<>chair with LRAD and min A PT Short Term Goal 2 (Week 1): pt will ambulate 76ft with LRAD and mod A of 1 PT Short Term Goal 3 (Week 1): Pt will initate stair navigation  Skilled Therapeutic Interventions/Progress Updates:   Treatment Session 1 Received pt sitting in [facility] ready for therapy, pt agreeable to therapy, and denied any pain during session but reported feeling nauseous but motivated to participate in therapy. RN notified at end of session. Session with emphasis on functional mobility/transfers, generalized strengthening, dynamic standing balance/coordination, gait training, NMR, and improved activity tolerance. Pt performed WC mobility 167ft x 1, and 165ft x 2 trials using BUE and supervision throughout session for improved UE strengthening. Sit<>stand with RW and mod A with R railing in hallway and ambulated 36ft x 1 and 79ft x 1 with RW and +2 assist. Pt demonstrated significant L lean and pushing requiring max verbal, visual, and tactile cues to weight shift to R. Placed mirror in front of pt for 2nd bout of walking and pt demonstrated improvements in ability to correct L lateral lean. Pt with increased difficulty ambulating with RW vs handrail yesterday as pt able to use R handrail to compensate and pull himself to R side. Pt demonstrated narrow BOS (but less scissoring compared to yesterday), downward gaze, and decreased bilateral foot clearance. Performed stand<>pivot transfers x3 throughout session with mod A with continued L lean and cues for hand placement. Pt performed BUE/LE strengthening on Nustep at workload 7 decreasing to workload 6 for 8 minutes for a total of 442  steps for improved cardiovascular endurance and reciprocal movement training with emphasis on midline orientation to correct L lean using mirror for visual feedback. Sit<>stand at table in dayroom x 2 trials with mod A and worked on dynamic standing balance clipping clothespins to clothesline using RUE with emphasis on R lateral weight shifting using mirror for visual feedback; pt reported difficulty with activity. Pt able to self-identify L lateral lean ~50% of time. Reported 7/10 fatigue at end of session and requested to return to bed. Pt transported back to room in [facility] total A and transferred sit<>semi-reclined with supervision. Doffed shoes and non-skid socks total A. Concluded session with pt semi-reclined in bed, needs within reach, and bed alarm on.   Treatment Session 2 Received pt supine in bed with NT taking vitals, pt agreeable to therapy, and denied any pain during session but reported fatigue from previous therapies. Session with emphasis on functional mobility/transfers, generalized strengthening, dynamic sitting balance/coordination, and improved activity tolerance. Semi-reclined<>sitting EOB with supervision and reported increased dizziness. While waiting for dizziness to resolve pt  performed the following exercises sitting EOB with BUE support and supervision with verbal/visual cues for technique: -hip flexion with 3lb ankle weight 2x20 bilaterally -LAQ with 4lb ankle weight 2x20 bilaterally  Lateral scoot bed<>WC to L with heavy mod A and L lateral LOB. Pt reported dizziness still "hanging on" and reported today being a "difficult" day but still motivated to continue. BP sitting in WC: 115/73. Squat<>pivot WC<>bed with mod A and sit<>supine with supervision. Concluded session with pt supine in bed, needs within reach, and bed alarm on awaiting SLP session.  Therapy Documentation Precautions:  Precautions Precautions: Fall,Other (comment) Precaution Comments: Diplopia, L  lean Restrictions Weight Bearing Restrictions: No  Therapy/Group: Individual Therapy Martin Majestic PT, DPT   10/23/2020, 7:23 AM

## 2020-10-24 LAB — GLUCOSE, CAPILLARY
Glucose-Capillary: 83 mg/dL (ref 70–99)
Glucose-Capillary: 107 mg/dL — ABNORMAL HIGH (ref 70–99)
Glucose-Capillary: 83 mg/dL (ref 70–99)
Glucose-Capillary: 93 mg/dL (ref 70–99)
Glucose-Capillary: 90 mg/dL (ref 70–99)

## 2020-10-24 NOTE — IPOC Note (Signed)
Overall Plan of Care (IPOC) Patient Details Name: Dennis Sprung Sr. 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)     Functional Problem List: Nursing Bladder,Medication Management,Safety,Bowel,Pain,Endurance  PT Balance,Endurance,Motor,Pain,Perception,Safety  OT Balance,Safety,Endurance,Vision,Motor  SLP    TR         Basic ADL's: OT Bathing,Dressing,Toileting     Advanced  ADL's: OT Simple Meal Preparation,Full Meal Preparation,Light Housekeeping,Laundry     Transfers: PT Bed Mobility,Bed to [employer]  OT Toilet,Tub/Shower     Locomotion: PT Ambulation,Wheelchair Mobility,Stairs     Additional Impairments: OT    SLP None      TR      Anticipated Outcomes Item Anticipated Outcome  Self Feeding mod I  Swallowing      Basic self-care  supervision  Toileting  supervision   Bathroom Transfers supervision  Bowel/Bladder  mod I management of B+B  Transfers  supervision with LRAD  Locomotion  CGA with LRAD  Communication     Cognition     Pain  at or below level 4  Safety/Judgment  Maintain safety with cues/reminders   Therapy Plan: PT Intensity: Minimum of 1-2 x/day ,45 to 90 minutes PT Frequency: 5 out of 7 days PT Duration Estimated Length of Stay: 12-14 days OT Intensity: Minimum of 1-2 x/day, 45 to 90 minutes OT Frequency: 5 out of 7 days OT Duration/Estimated Length of Stay: 2-2.5 weeks     Due to the current state of emergency, patients may not be receiving their 3-hours of Medicare-mandated therapy.   Team Interventions: Nursing Interventions Patient/Family Education,Bowel Management,Pain Management,Discharge Planning,Medication Management,Disease Management/Prevention,Bladder Management  PT interventions Ambulation/gait training,Discharge  planning,Functional mobility training,Therapeutic Activities,Visual/perceptual remediation/compensation,Balance/vestibular training,Disease management/prevention,Neuromuscular re-education,Therapeutic Exercise,Wheelchair propulsion/positioning,Cognitive remediation/compensation,DME/adaptive equipment instruction,Pain management,Splinting/orthotics,UE/LE Strength taining/ROM,Community reintegration,Functional electrical stimulation,Patient/family education,Stair training,UE/LE Coordination activities  OT Interventions Balance/vestibular training,Discharge planning,Self Care/advanced ADL retraining,Therapeutic Activities,Functional mobility training,Patient/family education,Therapeutic Exercise,Visual/perceptual remediation/compensation,DME/adaptive equipment instruction,UE/LE Strength taining/ROM,UE/LE Coordination activities  SLP Interventions    TR Interventions    SW/CM Interventions Discharge Planning,Psychosocial Support,Patient/Family Education   Barriers to Discharge MD  Medical stability  Nursing Decreased caregiver support,Home environment access/layout multi level 1/4 step entries with wife built in shower seat  PT Inaccessible home environment,Home environment access/layout,Other (comments) 1 STE with 0 rails and 4 steps with L handrail to get to bedroom/bathroom; visual impairments  OT Home environment access/layout,Inaccessible home environment Stairs in home  SLP      SW       Team Discharge Planning: Destination: PT-Home ,OT- Home , SLP-Home Projected Follow-up: PT-Outpatient PT, OT-  Outpatient OT, SLP-Other (comment) (Outpatient ENT who can determine need for Outpatient ST for voice) Projected Equipment Needs: PT-To be determined, OT- To be determined,3 in 1 bedside comode,Tub/shower seat, SLP-None recommended by SLP Equipment Details: PT-has RW and 3 point cane from previous TIA, OT-Unsure of current equipment Patient/family involved in discharge planning: PT- Patient,   OT-Patient, SLP-Patient  MD ELOS: 10-14 days Medical Rehab Prognosis:  Excellent Assessment: Dennis Strickland is a 65 year old man admitted to CIR with left-sidedfacialnumbness, vertigo, diplopia, dysphonia, gait deficits 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 Medications have been adjusted to treat nausea and pain. Other significant issues include vertigo and diplopia.     See Team Conference Notes for weekly updates to the plan of care

## 2020-10-24 NOTE — Progress Notes (Signed)
Occupational Therapy Session Note  Patient Details  Name: Dennis Gerstner Sr. MRN: [id number] Date of Birth: 1955-07-01  Today's Date: 10/24/2020 OT Individual Time: 8185-6314 OT Individual Time Calculation (min): 56 min    Short Term Goals: Week 1:  OT Short Term Goal 1 (Week 1): Patient will complete toileting with Min A. OT Short Term Goal 2 (Week 1): Patient will complete LB dressing/bathing min A with AE PRN. OT Short Term Goal 3 (Week 1): Patient will complete dynamic sitting balance ADL with no more than min verbal cues to maintain midline posture. OT Short Term Goal 4 (Week 1): Patient will complete stand pivot transfers min A consistently.  Skilled Therapeutic Interventions/Progress Updates:    Treatment session with focus on dynamic sitting balance and trunk control in sitting, standing, and during transitional movements.  Pt received supine in bed reporting some dizziness and nausea due to diplopia.  Pt donned glasses with tape to attempt to alleviate diplopia.  Pt completed bed mobility with supervision and squat pivot transfer to R with min assist.  Pt transported to therapy gym and completed squat pivot transfer to L min assist.  Therapist positioned dynadisc under pt for increased challenge in dynamic sitting.  Engaged in reaching in all quadrants with focus on trunk elongation and shortening in all diagonals with therapist facilitating and hips/trunk for further weight shift.  Engaged in reaching in standing with use of mirror for additional visual feedback of midline while therapist facilitating increased weight shift to R.  Pt continues to demonstrate L lean in sitting, increased in standing and locks out R knee when in standing.  Therapist provided tactile cues at R knee to decrease extension and facilitate weight shift through RLE when reaching to decrease L lean.  Pt completed stand pivot transfers with focus on weight shifting.  Pt returned to supine in bed and left with all needs  in reach.  Therapy Documentation Precautions:  Precautions Precautions: Fall,Other (comment) Precaution Comments: Diplopia, L lean Restrictions Weight Bearing Restrictions: No General:   Vital Signs: Therapy Vitals Temp: 97.6 F (36.4 C) Pulse Rate: 70 Resp: 16 BP: 114/62 Patient Position (if appropriate): Lying Oxygen Therapy SpO2: 99 % O2 Device: Room Air Pain:  Pt with no c/o pain   Therapy/Group: Individual Therapy  Rosalio Loud 10/24/2020, 2:54 PM

## 2020-10-24 NOTE — Progress Notes (Signed)
PROGRESS NOTE   Subjective/Complaints: Patient's chart reviewed- No issues reported overnight Vitals signs stable Progressing well with therapy but with significant left sided lean. Diplopia and facial numbness still bothersome to him, denies pain  ROS: +diplopia, +facial numbness, +nausea   Objective:   No results found. Recent Labs    10/22/20 0507  WBC 8.9  HGB 14.6  HCT 46.3  PLT 161   Recent Labs    10/22/20 0507  NA 139  K 4.9  CL 104  CO2 28  GLUCOSE 109*  BUN 17  CREATININE 1.18  CALCIUM 9.4    Intake/Output Summary (Last 24 hours) at 10/24/2020 4259 Last data filed at 10/24/2020 0818 Gross per 24 hour  Intake 582 ml  Output 1100 ml  Net -518 ml        Physical Exam: Vital Signs Blood pressure 131/83, pulse 76, temperature 98.2 F (36.8 C), temperature source Oral, resp. rate 18, height 6\' 1"  (1.854 m), weight 118.8 kg, SpO2 98 %. Gen: no distress, normal appearing HEENT: oral mucosa pink and moist, NCAT Cardio: Reg rate Chest: normal effort, normal rate of breathing Abd: soft, non-distended Ext: no edema Psych: pleasant, normal affect Musculoskeletal:  General: No swelling.  Cervical back: Normal range of motion.  Right lower leg: No edema.  Left lower leg: No edema.  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  Coloration: Skin is not jaundicedor pale.  Neurological:  Comments: Alert and oriented x 3. Normal insight and awareness. Intact Memory. Normal language and speech. Diplopia, dysphonia. Left hemi-facial sensory loss. Decreased depth perception with FTN, HTS. Strength nearly 5/5 in UE and LE bilaterally. Sensed pain and light touch in all 4 limbs.Strong left lateral lean Psychiatric:  Mood and Affect: Moodnormal.  Behavior: Behaviornormal.   Assessment/Plan: 1. Functional deficits which require 3+  hours per day of interdisciplinary therapy in a comprehensive inpatient rehab setting.  Physiatrist is providing close team supervision and 24 hour management of active medical problems listed below.  Physiatrist and rehab team continue to assess barriers to discharge/monitor patient progress toward functional and medical goals  Care Tool:  Bathing    Body parts bathed by patient: Right arm,Left arm,Chest,Abdomen,Front perineal area,Buttocks,Right upper leg,Left upper leg,Right lower leg,Left lower leg,Face         Bathing assist Assist Level: Contact Guard/Touching assist     Upper Body Dressing/Undressing Upper body dressing   What is the patient wearing?: Pull over shirt    Upper body assist Assist Level: Minimal Assistance - Patient > 75%    Lower Body Dressing/Undressing Lower body dressing      What is the patient wearing?: Pants,Underwear/pull up     Lower body assist Assist for lower body dressing: Moderate Assistance - Patient 50 - 74%     Toileting Toileting    Toileting assist Assist for toileting: Moderate Assistance - Patient 50 - 74%     Transfers Chair/bed transfer  Transfers assist     Chair/bed transfer assist level: Minimal Assistance - Patient > 75% (squat<>pivot to R)     Locomotion Ambulation   Ambulation assist      Assist  level: 2 helpers Assistive device: Lite Gait Max distance: 351ft   Walk 10 feet activity   Assist     Assist level: 2 helpers Assistive device: Lite Gait   Walk 50 feet activity   Assist Walk 50 feet with 2 turns activity did not occur: Safety/medical concerns (L lean, decreased balance, visual impairments)  Assist level: 2 helpers Assistive device: Lite Gait    Walk 150 feet activity   Assist Walk 150 feet activity did not occur: Safety/medical concerns (L lean, decreased balance, visual impairments)  Assist level: 2 helpers Assistive device: Lite Gait    Walk 10 feet on uneven surface   activity   Assist Walk 10 feet on uneven surfaces activity did not occur: Safety/medical concerns (L lean, decreased balance, visual impairments)         Wheelchair     Assist Will patient use wheelchair at discharge?: Yes Type of Wheelchair: Manual    Wheelchair assist level: Supervision/Verbal cueing Max wheelchair distance: >183ft    Wheelchair 50 feet with 2 turns activity    Assist        Assist Level: Supervision/Verbal cueing   Wheelchair 150 feet activity     Assist      Assist Level: Supervision/Verbal cueing   Blood pressure 131/83, pulse 76, temperature 98.2 F (36.8 C), temperature source Oral, resp. rate 18, height 6\' 1"  (1.854 m), weight 118.8 kg, SpO2 98 %.    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 Impaired mobility: continue Lovenox -antiplatelet therapy: Aspirin 325 mg daily and Plavix 75 mg dailyx3 months then Plavix alone 3. Pain Management:Denies d/c Neurontin 300 mg daily 4. Mood:Provide emotional support -antipsychotic agents: N/A 5. Neuropsych: This patientiscapable of making decisions on hisown behalf. 6. Skin/Wound Care:Routine skin checks 7. Fluids/Electrolytes/Nutrition:pt has good appetite, d/c ensure supplements -electrolytes stable, no need to recheck.  8. Hyperlipidemia. Continue Crestor 9. Prediabetes. Hemoglobin A1c 5.8. SSI. Advised no added sugar/bread/pasta/rice and high vegetable/nuts/olive oil diet.  10. BPH. Proscar 5 mg daily -monitor voiding patterns 11. Nausea: zofran prescribed 12. Diplopia: continue tape over left eyeglass lens, helping   LOS: 3 days A FACE TO FACE EVALUATION WAS PERFORMED  Leonides Minder P  Aijalon Kirtz 10/24/2020, 9:07 AM

## 2020-10-24 NOTE — Progress Notes (Signed)
Occupational Therapy Session Note  Patient Details  Name: Dennis Strickland Sr. MRN: [id number] Date of Birth: [birth year]  Today's Date: 10/24/2020 OT Individual Time: 1105-1205 OT Individual Time Calculation (min): 60 min    Short Term Goals: Week 1:  OT Short Term Goal 1 (Week 1): Patient will complete toileting with Min A. OT Short Term Goal 2 (Week 1): Patient will complete LB dressing/bathing min A with AE PRN. OT Short Term Goal 3 (Week 1): Patient will complete dynamic sitting balance ADL with no more than min verbal cues to maintain midline posture. OT Short Term Goal 4 (Week 1): Patient will complete stand pivot transfers min A consistently.  Skilled Therapeutic Interventions/Progress Updates:    Pt received supine in bed, agreeable to OT session, and no pain reported. Sup<>sit close spvsn for balance. Squat pivot transfers bed<>w/c<>therapy mat CGA-min A due to left lean and decreased balance. On mat in therapy gym, pt engaged in neuromuscular re-ed activities to address midline awareness/L lean, dynamic sitting and standing balance, RLE weightbearing, and functional mobility. Pt completed  ~20 sit<>stands performed with and without LLE on raised block to increase muscle activation of RLE. Rest breaks required due to pt's report of nausea following prolonged activity. BP 118/73. Pt demo'd good caryover of strategy of placing bilateral hands on R knee when standing to improve L lean, but adapted to reaching back with R hand during sitting with improved midline awareness/balance. Pt required to reach across midline standing/sitting while maintaining midline posture. Pt education, visual feedback, mod-max verbal and tactile cues, and body mechanics strategies provided. Pt engaged in quadruped bird dog exercise with noted decreased balance, L lean, and compensatory movements.   Therapy Documentation Precautions:  Precautions Precautions: Fall,Other (comment) Precaution Comments: Diplopia, L  lean Restrictions Weight Bearing Restrictions: No Pain: Pain Assessment Pain Scale: 0-10 Pain Score: 0-No pain   Therapy/Group: Individual Therapy  Daylene Katayama 10/24/2020, 4:54 PM

## 2020-10-24 NOTE — Progress Notes (Signed)
Physical Therapy Session Note  Patient Details  Name: Dennis Hehir Sr. MRN: [id number] Date of Birth: May 15, 1956  Today's Date: 10/24/2020 PT Individual Time: 0800-0854 PT Individual Time Calculation (min): 54 min   Short Term Goals: Week 1:  PT Short Term Goal 1 (Week 1): pt will transfer bed<>chair with LRAD and min A PT Short Term Goal 2 (Week 1): pt will ambulate 35ft with LRAD and mod A of 1 PT Short Term Goal 3 (Week 1): Pt will initate stair navigation  Skilled Therapeutic Interventions/Progress Updates:   Received pt supine in bed, pt agreeable to PT treatment, and denied any pain during session and reported sleeping well last night. Session with emphasis on functional mobility/transfers, generalized strengthening, dynamic standing balance/coordination, and gait training. Supine<>sitting EOB with supervision and use of bedrails and donned crocs with supervision. Encouraged pt to ask his wife to bring in tennis shoes for therapy; pt confirmed wife will bring them next time she comes. Transferred bed<>WC via lateral scoot with mod A to L with cues for hand placement on WC armrests and to scoot hips all the way to the L. Pt transported to 4W dayroom in [facility] total A for time management and energy conservation purposes. Donned LiteGait harness in standing with mod A +2. Pt stepped up onto treadmill with mod A of 1 and BUE support. Pt worked on Investment banker, operational for the following time frames on Scientist, research (physical sciences) for visual feedback: Trial 1: 3 minutes and 54 seconds at 0. increasing to 0. for 112ft Trial 2: 3 minutes and 57 seconds at 0.14mph increasing to 1. for 265ft Trial 3: 4 minutes and 1 second at 1. decreasing to for 364ft Pt with continued L lateral lean, worsening with fatigue. Pt with difficulty maintaining midline orientation despite verbal and visual cues and demonstrated decreased R knee flexion during swing phase. Pt required multiple rest/water breaks  throughout due to fatigue. Doffed harness standing with +2 assist and stepped off treadmill with mod A of 1 and BUE support. Pt transported back to room in [facility] total A and requested to rest in bed. Squat<>pivot WC<>bed to R with min A. Concluded session with pt sitting EOB brushing teeth and washing face, needs within reach, and bed alarm on. NT present attending to care.   Therapy Documentation Precautions:  Precautions Precautions: Fall,Other (comment) Precaution Comments: Diplopia, L lean Restrictions Weight Bearing Restrictions: No  Therapy/Group: Individual Therapy Martin Majestic PT, DPT   10/24/2020, 7:14 AM

## 2020-10-25 LAB — GLUCOSE, CAPILLARY
Glucose-Capillary: 109 mg/dL — ABNORMAL HIGH (ref 70–99)
Glucose-Capillary: 101 mg/dL — ABNORMAL HIGH (ref 70–99)
Glucose-Capillary: 83 mg/dL (ref 70–99)
Glucose-Capillary: 96 mg/dL (ref 70–99)

## 2020-10-25 NOTE — Progress Notes (Signed)
Occupational Therapy Session Note  Patient Details  Name: Dennis Cederberg Sr. MRN: [id number] Date of Birth: 23-Jul-1955  Today's Date: 10/25/2020 OT Individual Time: 1345-1415 OT Individual Time Calculation (min): 30 min  and Today's Date: 10/25/2020 OT Group Time: 1100-1200 OT Group Time Calculation (min): 60 min     Short Term Goals: Week 1:  OT Short Term Goal 1 (Week 1): Patient will complete toileting with Min A. OT Short Term Goal 2 (Week 1): Patient will complete LB dressing/bathing min A with AE PRN. OT Short Term Goal 3 (Week 1): Patient will complete dynamic sitting balance ADL with no more than min verbal cues to maintain midline posture. OT Short Term Goal 4 (Week 1): Patient will complete stand pivot transfers min A consistently. Week 2:     Skilled Therapeutic Interventions/Progress Updates:    Session 1: Pt participated in rhythmic drumming group. Pain not reported during session Focus of group on BUE coordination, strengthening, endurance, timing/control, activity tolerance, and social participation and engagement. Pt performs session from seated position for energy conservation. Skilled interventions included rhythm utilized for timing and sequencing of BUE; trunk control encouraged by having pt sit up from back of chair. Warm up performed prior to exercises and UB stretching completed at end of group with demo from OT. Pt able to select preferred song to share with group. Returned pt to room at end of session. Exited session with pt seated in bed, exit alarm on and call light in reach  Session 2:  Pt received in w/c agreeable to shower as no tx tomorrow.  ADL:  Pt completes bathing with sit to stand with grab bar. Pt very modest and insistent on curtain being closed pt leaning against wall for steadying during peri care/buttock hygiene. Pt completes UB dressing with set up seated at sink Pt completes LB dressing with MIN A leaning into wall to pull underwear up hips in  shower stall despite cuing for midline orientation to don underwear in shwer stall where curtain drawn in front. MIN A at sit to stand at sink to don pants.  Pt completes footwear with total A for time management d/t urgency of BM Pt completes toileting with MIN A for standing balance and use of wall to hold upright posture despite cuing for grab bar Pt completes toileting transfer with MIN A usng grab bar, arm rest and wall to step from TTB>BSC over tolet>w/c Pt completes shower/Tub transfer with MIN A and minor L lean Pt family arrived and pt in bed requesting to visit instead of last 15 min of continued tx. "I dont feel shorted[on therapy time]."  Pt left at end of session in bed with exit alarm on, call light in reach and all needs met    Therapy Documentation Precautions:  Precautions Precautions: Fall,Other (comment) Precaution Comments: Diplopia, L lean Restrictions Weight Bearing Restrictions: No General:   Vital Signs: Therapy Vitals Temp: 97.7 F (36.5 C) Temp Source: Oral Pulse Rate: 66 Resp: 20 BP: 136/84 Patient Position (if appropriate): Lying Oxygen Therapy SpO2: 97 % O2 Device: Room Air Pain: Pain Assessment Pain Scale: 0-10 Pain Score: Asleep Pain Type: Acute pain Pain Location: Head Pain Orientation: Left;Right Pain Descriptors / Indicators: Aching Pain Frequency: Intermittent Pain Onset: Gradual Patients Stated Pain Goal: 2 Pain Intervention(s): Medication (See eMAR) ADL: ADL Eating: Set up Where Assessed-Eating: Bed level Grooming: Setup Where Assessed-Grooming: Sitting at sink,Wheelchair Upper Body Bathing: Contact guard Where Assessed-Upper Body Bathing: Shower (TTB) Lower Body Bathing: Contact guard  Where Assessed-Lower Body Bathing: Shower (TTB) Upper Body Dressing: Minimal assistance Where Assessed-Upper Body Dressing: Other (Comment) (Sitting in shower (TTB)) Lower Body Dressing: Moderate assistance Where Assessed-Lower Body Dressing:  Other (Comment) (Sitting and in stance in shower (TTB, grab bars)) Toileting: Moderate assistance Where Assessed-Toileting: Bedside Commode Toilet Transfer: Moderate assistance Toilet Transfer Method: Sit pivot,Squat pivot Toilet Transfer Equipment: Drop arm bedside commode,Grab bars Social research officer, government: Moderate assistance Social research officer, government Method: Sit pivot,Squat pivot Youth worker: Research scientist (life sciences)   Exercises:   Other Treatments:     Therapy/Group: Individual Therapy and Group Therapy  Tonny Branch 10/25/2020, 6:50 AM

## 2020-10-25 NOTE — Progress Notes (Signed)
Physical Therapy Session Note  Patient Details  Name: Dennis Aultman Sr. MRN: [id number] Date of Birth: 05/31/1956  Today's Date: 10/25/2020 PT Individual Time: 1715-1735 PT Individual Time Calculation (min): 20 min   Short Term Goals: Week 1:  PT Short Term Goal 1 (Week 1): pt will transfer bed<>chair with LRAD and min A PT Short Term Goal 2 (Week 1): pt will ambulate 1f with LRAD and mod A of 1 PT Short Term Goal 3 (Week 1): Pt will initate stair navigation  Skilled Therapeutic Interventions/Progress Updates:   Pt received sitting in WC and agreeable to PT. WC mobility through hall x [id number] 2 with supervision assist with cues for doorway management. Pt reports need for urination. Able to complete urination in standing with min assist to prevent lateral lean to the R. BITS visual scanning in sitting x 1 min and in standing x 1 min with min assist for midline orientation and 1 UE support on RW in standing with lateral reaches only. Patient returned to room and performed stand pivot to recliner with min assist and RW. Pt left sitting in recliner with call bell in reach and all needs met.         Therapy Documentation Precautions:  Precautions Precautions: Fall,Other (comment) Precaution Comments: Diplopia, L lean Restrictions Weight Bearing Restrictions: No    Pain: denies   Therapy/Group: Individual Therapy  AuLorie Phenix/14/2022, 5:44 PM

## 2020-10-25 NOTE — Progress Notes (Signed)
Physical Therapy Session Note  Patient Details  Name: Dennis Schinke Sr. MRN: [id number] Date of Birth: 1955-08-05  Today's Date: 10/25/2020 PT Individual Time: 2761-4709 PT Individual Time Calculation (min): 40 min   Short Term Goals: Week 1:  PT Short Term Goal 1 (Week 1): pt will transfer bed<>chair with LRAD and min A PT Short Term Goal 2 (Week 1): pt will ambulate 55f with LRAD and mod A of 1 PT Short Term Goal 3 (Week 1): Pt will initate stair navigation Week 2:    Week 3:     Skilled Therapeutic Interventions/Progress Updates:   Pt received sitting in WC and agreeable to PT. [facility]   Sit<>stand with supervision assist in Prallel bars, gait training in parallel bars forward/reverse 2 x 162fwith min assist and heave use of rails.  Sit<>stand in RW with min assist with cues to push from sitting surface x 8 throughout session. Dynamic balance training in RW to maintain semitandem x 10 sec each. Min assist from PT for safety.  Gait training with RW x 10032fith RW x 2 mod assist, increasing L lateral lean with fatigue, but able to correct with standing rest breaks. Cues for wide BOS on the LLE and improved use of external cues to improve awareness of midline.    Stand pivot transfer to WC [facility]. PT performed WC mobility through hall x [id number] supervision assist Pt returned to room and performed stand pivot transfer to bed with min assist and cues for wide BOS to reduce fall risk . Sit>supine completed with supervision assist for safety, and left supine in bed with call bell in reach and all needs met.           Therapy Documentation Precautions:  Precautions Precautions: Fall,Other (comment) Precaution Comments: Diplopia, L lean Restrictions Weight Bearing Restrictions: No Vital Signs: Therapy Vitals Temp: 97.8 F (36.6 C) Pulse Rate: 67 Resp: 18 BP: 133/80 Patient Position (if appropriate): Lying Oxygen Therapy SpO2: 97 % O2  Device: Room Air Pain:   denies  Therapy/Group: Individual Therapy  AustLorie Phenix4/2022, 4:15 PM

## 2020-10-26 DIAGNOSIS — G463 Brain stem stroke syndrome: Secondary | ICD-10-CM

## 2020-10-26 LAB — GLUCOSE, CAPILLARY
Glucose-Capillary: 101 mg/dL — ABNORMAL HIGH (ref 70–99)
Glucose-Capillary: 80 mg/dL (ref 70–99)
Glucose-Capillary: 79 mg/dL (ref 70–99)
Glucose-Capillary: 102 mg/dL — ABNORMAL HIGH (ref 70–99)
Glucose-Capillary: 106 mg/dL — ABNORMAL HIGH (ref 70–99)

## 2020-10-26 NOTE — Plan of Care (Signed)
  Problem: Consults Goal: RH STROKE PATIENT EDUCATION Description: See Patient Education module for education specifics  Outcome: Progressing   Problem: RH BOWEL ELIMINATION Goal: RH STG MANAGE BOWEL WITH ASSISTANCE Description: STG Manage Bowel with mod I  Assistance. Outcome: Progressing Goal: RH STG MANAGE BOWEL W/MEDICATION W/ASSISTANCE Description: STG Manage Bowel with Medication with mod I Assistance. Outcome: Progressing   Problem: RH BLADDER ELIMINATION Goal: RH STG MANAGE BLADDER WITH ASSISTANCE Description: STG Manage Bladder With mod I Assistance Outcome: Progressing   Problem: RH SAFETY Goal: RH STG ADHERE TO SAFETY PRECAUTIONS W/ASSISTANCE/DEVICE Description: STG Adhere to Safety Precautions With cues/reminders Assistance/Device. Outcome: Progressing   Problem: RH PAIN MANAGEMENT Goal: RH STG PAIN MANAGED AT OR BELOW PT'S PAIN GOAL Description: At or below level 4 Outcome: Progressing   Problem: RH KNOWLEDGE DEFICIT Goal: RH STG INCREASE KNOWLEDGE OF DIABETES Description: Patient will be able to manage DM with medications and dietary modiifications using handouts and educational tools independently Outcome: Progressing Goal: RH STG INCREASE KNOWLEDGE OF HYPERTENSION Description: Patient will be able to manage HTN with medications and dietary modiifications using handouts and educational tools independently Outcome: Progressing Goal: RH STG INCREASE KNOWLEGDE OF HYPERLIPIDEMIA Description: Patient will be able to manage HLD with medications and dietary modiifications using handouts and educational tools independently Outcome: Progressing Goal: RH STG INCREASE KNOWLEDGE OF STROKE PROPHYLAXIS Description: Patient will be able to manage secondary stroke risks with medications and dietary modiifications using handouts and educational tools independently Outcome: Progressing

## 2020-10-26 NOTE — Progress Notes (Addendum)
PROGRESS NOTE   Subjective/Complaints:  Asking about type of stroke, still has hoarse voice, swallowing well, occ hiccups  ROS: +diplopia, +facial numbness, +nausea   Objective:   No results found. No results for input(s): WBC, HGB, HCT, PLT in the last 72 hours. No results for input(s): NA, K, CL, CO2, GLUCOSE, BUN, CREATININE, CALCIUM in the last 72 hours.  Intake/Output Summary (Last 24 hours) at 10/26/2020 1227 Last data filed at 10/26/2020 1126 Gross per 24 hour  Intake 718 ml  Output 2425 ml  Net -1707 ml        Physical Exam: Vital Signs Blood pressure 128/82, pulse 65, temperature 97.8 F (36.6 C), temperature source Oral, resp. rate 14, height 6\' 1"  (1.854 m), weight 118.8 kg, SpO2 97 %.  General: No acute distress Mood and affect are appropriate Heart: Regular rate and rhythm no rubs murmurs or extra sounds Lungs: Clear to auscultation, breathing unlabored, no rales or wheezes Abdomen: Positive bowel sounds, soft nontender to palpation, nondistended Extremities: No clubbing, cyanosis, or edema Skin: No evidence of breakdown, no evidence of rash  Musculoskeletal:  General: No swelling.  Cervical back: Normal range of motion.  Right lower leg: No edema.  Left lower leg: No edema.  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  Coloration: Skin is not jaundicedor pale.  Neurological:  Comments: Alert and oriented x 3. Normal insight and awareness. Intact Memory. Normal language and speech. Diplopia, dysphonia. Left hemi-facial sensory loss. Decreased depth perception with FTN, HTS. Strength nearly 5/5 in UE and LE bilaterally. Sensed pain and light touch in all 4 limbs.Strong left lateral lean Psychiatric:  Mood and Affect: Moodnormal.  Behavior: Behaviornormal.   Assessment/Plan: 1. Functional deficits which require 3+ hours per  day of interdisciplinary therapy in a comprehensive inpatient rehab setting.  Physiatrist is providing close team supervision and 24 hour management of active medical problems listed below.  Physiatrist and rehab team continue to assess barriers to discharge/monitor patient progress toward functional and medical goals  Care Tool:  Bathing    Body parts bathed by patient: Right arm,Left arm,Chest,Abdomen,Front perineal area,Buttocks,Right upper leg,Left upper leg,Right lower leg,Left lower leg,Face         Bathing assist Assist Level: Contact Guard/Touching assist     Upper Body Dressing/Undressing Upper body dressing   What is the patient wearing?: Pull over shirt    Upper body assist Assist Level: Minimal Assistance - Patient > 75%    Lower Body Dressing/Undressing Lower body dressing      What is the patient wearing?: Pants,Underwear/pull up     Lower body assist Assist for lower body dressing: Moderate Assistance - Patient 50 - 74%     Toileting Toileting    Toileting assist Assist for toileting: Minimal Assistance - Patient > 75% (Used urinal)     Transfers Chair/bed transfer  Transfers assist     Chair/bed transfer assist level: Minimal Assistance - Patient > 75% (squat<>pivot to R)     Locomotion Ambulation   Ambulation assist      Assist level: 2 helpers Assistive device: Lite Gait Max distance: 384ft   Walk 10 feet  activity   Assist     Assist level: 2 helpers Assistive device: Lite Gait   Walk 50 feet activity   Assist Walk 50 feet with 2 turns activity did not occur: Safety/medical concerns (L lean, decreased balance, visual impairments)  Assist level: 2 helpers Assistive device: Lite Gait    Walk 150 feet activity   Assist Walk 150 feet activity did not occur: Safety/medical concerns (L lean, decreased balance, visual impairments)  Assist level: 2 helpers Assistive device: Lite Gait    Walk 10 feet on uneven surface   activity   Assist Walk 10 feet on uneven surfaces activity did not occur: Safety/medical concerns (L lean, decreased balance, visual impairments)         Wheelchair     Assist Will patient use wheelchair at discharge?: Yes Type of Wheelchair: Manual    Wheelchair assist level: Supervision/Verbal cueing Max wheelchair distance: >173ft    Wheelchair 50 feet with 2 turns activity    Assist        Assist Level: Supervision/Verbal cueing   Wheelchair 150 feet activity     Assist      Assist Level: Supervision/Verbal cueing   Blood pressure 128/82, pulse 65, temperature 97.8 F (36.6 C), temperature source Oral, resp. rate 14, height 6\' 1"  (1.854 m), weight 118.8 kg, SpO2 97 %.    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 Impaired mobility: continue Lovenox -antiplatelet therapy: Aspirin 325 mg daily and Plavix 75 mg dailyx3 months then Plavix alone 3. Pain Management:Denies d/c Neurontin 300 mg daily 4. Mood:Provide emotional support -antipsychotic agents: N/A 5. Neuropsych: This patientiscapable of making decisions on hisown behalf. 6. Skin/Wound Care:Routine skin checks 7. Fluids/Electrolytes/Nutrition:pt has good appetite, d/c ensure supplements -electrolytes stable, no need to recheck.  8. Hyperlipidemia. Continue Crestor 9. Prediabetes. Hemoglobin A1c 5.8. SSI. Advised no added sugar/bread/pasta/rice and high vegetable/nuts/olive oil diet.  10. BPH. Proscar 5 mg daily -monitor voiding patterns 11. Nausea: zofran prescribed 12. Diplopia: continue tape over left eyeglass lens, helping 13.  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

## 2020-10-27 LAB — GLUCOSE, CAPILLARY
Glucose-Capillary: 97 mg/dL (ref 70–99)
Glucose-Capillary: 103 mg/dL — ABNORMAL HIGH (ref 70–99)
Glucose-Capillary: 145 mg/dL — ABNORMAL HIGH (ref 70–99)
Glucose-Capillary: 98 mg/dL (ref 70–99)
Glucose-Capillary: 114 mg/dL — ABNORMAL HIGH (ref 70–99)

## 2020-10-27 NOTE — Progress Notes (Signed)
Physical Therapy Session Note  Patient Details  Name: Dennis Bohr Sr. MRN: [id number] Date of Birth: 05-03-1956  Today's Date: 10/27/2020 PT Individual Time: 1006-1059 PT Individual Time Calculation (min): 53 min   Short Term Goals: Week 1:  PT Short Term Goal 1 (Week 1): pt will transfer bed<>chair with LRAD and min A PT Short Term Goal 2 (Week 1): pt will ambulate 5ft with LRAD and mod A of 1 PT Short Term Goal 3 (Week 1): Pt will initate stair navigation  Skilled Therapeutic Interventions/Progress Updates:    Pt received supine in bed and eager to participate in therapy session though reporting feeling "nausea" today - reports premedicated. Supine>sitting R EOB, HOB slightly elevated, with supervision. Sitting EOB donned shoes max assist for time management. L stand pivot transfer EOB>w/c, no AD, with mod assist for balance due to strong L lean in standing requiring assist to prevent LOB and ensure hips landed in chair.  Transported to/from gym in w/c for time management and energy conservation. L stand pivot w/c>EOM, no AD, with min assist this time and pt using B UE support on therapist's shoulders which he is able to use to compensate to complete weight shift and decrease L lean/LOB. Donned maxisky harness.  In [facility] for safety performed the following dynamic standing balance and gait training tasks to promote increased R weight shift for decreased L lean:  - standing with L UE support around therapist's shoulders progressed to no UE support while completing R weight shift and L LE foot lifts progressed to foot taps on/off 6" step to promote longer R weight shift - mirror feedback (pt wearing glasses for decreased double vision) - continues to require min/mod assist for balance though improving - max cuing for weight shifting at hips - gait training, +2 assist for safety, down/back ~75ft x3 no UE support working on reciprocal stepping pattern and increased R weight shift during R stance/L  swing phase - mirror feedback and continued cuing and external targets to promote increased R weight shift  Pt reporting worsening nausea with the balance/gait tasks therefore therapist provided seated rest breaks and a cool washcloth. R squat pivot EOM>w/c with min assist for balance/safety. Transported back to room and pt requesting to lie down due to nausea. R squat pivot as above and sit>supine supervision. Pt left supine in bed with needs in reach, cool washcloth in place, and bed alarm on.  Therapy Documentation Precautions:  Precautions Precautions: Fall,Other (comment) Precaution Comments: Diplopia, L lean Restrictions Weight Bearing Restrictions: No  Pain:   Denies pain during session, only has nausea.    Therapy/Group: Individual Therapy  Ginny Forth , PT, DPT, CSRS  10/27/2020, 7:59 AM

## 2020-10-27 NOTE — Progress Notes (Signed)
PROGRESS NOTE   Subjective/Complaints: Continues to have facial numbness, diplopia, and nausea, stable Working well with therapy Appreciate nurse CM education Vitals stable  ROS: +diplopia, +facial numbness, +nausea   Objective:   No results found. No results for input(s): WBC, HGB, HCT, PLT in the last 72 hours. No results for input(s): NA, K, CL, CO2, GLUCOSE, BUN, CREATININE, CALCIUM in the last 72 hours.  Intake/Output Summary (Last 24 hours) at 10/27/2020 1211 Last data filed at 10/27/2020 0734 Gross per 24 hour  Intake 1062 ml  Output 1225 ml  Net -163 ml        Physical Exam: Vital Signs Blood pressure 123/75, pulse 69, temperature 97.8 F (36.6 C), temperature source Oral, resp. rate 16, height 6\' 1"  (1.854 m), weight 118.8 kg, SpO2 96 %. Gen: no distress, normal appearing HEENT: oral mucosa pink and moist, NCAT Cardio: Reg rate Chest: normal effort, normal rate of breathing Abd: soft, non-distended Ext: no edema Psych: pleasant, normal affect Musculoskeletal:  General: No swelling.  Cervical back: Normal range of motion.  Right lower leg: No edema.  Left lower leg: No edema.  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  Coloration: Skin is not jaundicedor pale.  Neurological:  Comments: Alert and oriented x 3. Normal insight and awareness. Intact Memory. Normal language and speech. Diplopia, dysphonia. Left hemi-facial sensory loss. Decreased depth perception with FTN, HTS. Strength nearly 5/5 in UE and LE bilaterally. Sensed pain and light touch in all 4 limbs.Strong left lateral lean Psychiatric:  Mood and Affect: Moodnormal.  Behavior: Behaviornormal.   Assessment/Plan: 1. Functional deficits which require 3+ hours per day of interdisciplinary therapy in a comprehensive inpatient rehab setting.  Physiatrist is providing  close team supervision and 24 hour management of active medical problems listed below.  Physiatrist and rehab team continue to assess barriers to discharge/monitor patient progress toward functional and medical goals  Care Tool:  Bathing    Body parts bathed by patient: Right arm,Left arm,Chest,Abdomen,Front perineal area,Buttocks,Right upper leg,Left upper leg,Right lower leg,Left lower leg,Face         Bathing assist Assist Level: Contact Guard/Touching assist     Upper Body Dressing/Undressing Upper body dressing   What is the patient wearing?: Pull over shirt    Upper body assist Assist Level: Minimal Assistance - Patient > 75%    Lower Body Dressing/Undressing Lower body dressing      What is the patient wearing?: Pants,Underwear/pull up     Lower body assist Assist for lower body dressing: Moderate Assistance - Patient 50 - 74%     Toileting Toileting    Toileting assist Assist for toileting: Minimal Assistance - Patient > 75% (Used urinal)     Transfers Chair/bed transfer  Transfers assist     Chair/bed transfer assist level: Minimal Assistance - Patient > 75%     Locomotion Ambulation   Ambulation assist      Assist level: 2 helpers Assistive device: Lite Gait Max distance: 362ft   Walk 10 feet activity   Assist     Assist level: 2 helpers Assistive device: Lite Gait   Walk 50 feet activity  Assist Walk 50 feet with 2 turns activity did not occur: Safety/medical concerns (L lean, decreased balance, visual impairments)  Assist level: 2 helpers Assistive device: Lite Gait    Walk 150 feet activity   Assist Walk 150 feet activity did not occur: Safety/medical concerns (L lean, decreased balance, visual impairments)  Assist level: 2 helpers Assistive device: Lite Gait    Walk 10 feet on uneven surface  activity   Assist Walk 10 feet on uneven surfaces activity did not occur: Safety/medical concerns (L lean, decreased  balance, visual impairments)         Wheelchair     Assist Will patient use wheelchair at discharge?: Yes Type of Wheelchair: Manual    Wheelchair assist level: Supervision/Verbal cueing Max wheelchair distance: >163ft    Wheelchair 50 feet with 2 turns activity    Assist        Assist Level: Supervision/Verbal cueing   Wheelchair 150 feet activity     Assist      Assist Level: Supervision/Verbal cueing   Blood pressure 123/75, pulse 69, temperature 97.8 F (36.6 C), temperature source Oral, resp. rate 16, height 6\' 1"  (1.854 m), weight 118.8 kg, SpO2 96 %.    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 Impaired mobility: d/c Lovenox since ambulating >700 feet -antiplatelet therapy: Aspirin 325 mg daily and Plavix 75 mg dailyx3 months then Plavix alone 3. Pain Management:Denies d/c Neurontin 300 mg daily 4. Mood:Provide emotional support -antipsychotic agents: N/A 5. Neuropsych: This patientiscapable of making decisions on hisown behalf. 6. Skin/Wound Care:Routine skin checks 7. Fluids/Electrolytes/Nutrition:pt has good appetite, d/c ensure supplements -electrolytes stable, no need to recheck.  8. Hyperlipidemia. Continue Crestor 9. Prediabetes. Hemoglobin A1c 5.8. SSI. Advised no added sugar/bread/pasta/rice and high vegetable/nuts/olive oil diet.  10. BPH. ContinueProscar 5 mg daily -monitor voiding patterns 11. Nausea: zofran prescribed 12. Diplopia: continue tape over left eyeglass lens, helping 13.  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 Obesity BMI 34.55: provided dietary education  LOS: 6 days A  FACE TO FACE EVALUATION WAS PERFORMED  P Malissa Slay 10/27/2020, 12:11 PM

## 2020-10-27 NOTE — Progress Notes (Signed)
Awaken from sleep with c/o left sided facial numbness with blurred vision/double vision to left eye medicated with Tylenol 650 mg po,

## 2020-10-27 NOTE — Progress Notes (Signed)
Occupational Therapy Session Note  Patient Details  Name: Dennis Krenz Sr. MRN: [id number] Date of Birth: 17-Sep-1955  Today's Date: 10/27/2020 OT Individual Time: 0102-7253 OT Individual Time Calculation (min): 57 min    Short Term Goals: Week 1:  OT Short Term Goal 1 (Week 1): Patient will complete toileting with Min A. OT Short Term Goal 2 (Week 1): Patient will complete LB dressing/bathing min A with AE PRN. OT Short Term Goal 3 (Week 1): Patient will complete dynamic sitting balance ADL with no more than min verbal cues to maintain midline posture. OT Short Term Goal 4 (Week 1): Patient will complete stand pivot transfers min A consistently.  Skilled Therapeutic Interventions/Progress Updates:   Pt received supine in bed, no pain reported, and agreeable to OT session; reports slight nausea this AM. Sup<>sit close spvsn using bed rails. Pt donned nonskid socks and tennis shoes sitting EOB with min A to maintain balance/L lean using figure 4 technique. Squat pivot transfers CGA bed<>w/c<>raised toilet. Stand pivot transfers min A to maintain balance and min vc's for correcting L lean to midline posture and hand placement. Set up A seated in w/c to complete oral hygiene at sink. Pt self-propelled w/c ~150ft quickly with no signs of fatigue and demo'd good navigation of obstacles in hallways and tight space rooms. In kitchen, pt engaged in simulated simple meal prep tasks targeting dynamic standing balance, midline posture, reaching past midline, and safety awareness. Pt required min-mod vc's to improve BOS by widening stance, safe L hand positioning for stabilization in reaching (on countertop and not on cabinet handles), and weight-shifting to R side. Pt completed side stepping ~20' to left and right with dynamic reaching with improved R step and decreased L lean ~80% of the time. Pt reported increased nausea and provided rest break. Pt engaged in functional transfer education and trials to raised  toilet using grab bar, walk-in shower and shower bench, and furniture transfer to simulate home environment. Pt education on safe performance of transfers, DME, and recommendation of family education; demo'd good understanding. Pt requesting information on d/c date recommendation/process and informed his team conference is tomorrow. Pt left supine in room with alarm set and call bell in reach; RN informed of pt request for nausea meds.   Therapy Documentation Precautions:  Precautions Precautions: Fall,Other (comment) Precaution Comments: Diplopia, L lean Restrictions Weight Bearing Restrictions: No Pain: Pain Assessment Pain Scale: 0-10 Pain Score: 0-No pain   Therapy/Group: Individual Therapy  Daylene Katayama 10/27/2020, 9:36 AM

## 2020-10-27 NOTE — Progress Notes (Signed)
Patient ID: Dennis Neubert Sr., male   DOB: April 20, 1956, 65 y.o.   MRN: [id number] Follow up with the patient regarding role of the nurse CM and educational needs. Reviewed handouts given for secondary risk management including DASH diet, HTN, HLD (LDL 80; goal of <70 and Trig level 189) and prediabetes A1C 5.8. Patient noted his wife had reviewed and he understood dietary modifications and medication management. DAPT for 3 months then Plavix solo. Continue to follow along to discharge to address educational needs/questions. Collaborate with the SW to facilitate preparation for discharge. Pamelia Hoit, RN

## 2020-10-27 NOTE — Progress Notes (Signed)
Occupational Therapy Session Note  Patient Details  Name: Dennis Deeb Sr. MRN: [id number] Date of Birth: [birth year]  Today's Date: 10/27/2020 OT Individual Time: 4270-6237 OT Individual Time Calculation (min): 60 min    Short Term Goals: Week 1:  OT Short Term Goal 1 (Week 1): Patient will complete toileting with Min A. OT Short Term Goal 2 (Week 1): Patient will complete LB dressing/bathing min A with AE PRN. OT Short Term Goal 3 (Week 1): Patient will complete dynamic sitting balance ADL with no more than min verbal cues to maintain midline posture. OT Short Term Goal 4 (Week 1): Patient will complete stand pivot transfers min A consistently.  Skilled Therapeutic Interventions/Progress Updates:    Treatment session with focus on self-care retraining, functional transfers, and problem solving home shower access.  Pt received seated EOB agreeable to shower this session. Pt completed squat pivot transfer bed > w/c with CGA.  Pt propelled w/c in to bathroom and setup w/c next to shower.  Pt completed squat pivot w/c > tub bench with CGA.  Pt completed bathing with lateral leans and sit > stand with use of grab bars.  Pt completed dressing in shower stall with setup for items due to modesty.  Pt transferred stepping out of shower over ledge with CGA.  Pt completed oral care sitting at sink without assistance.  Pt expressing desire to d/c home as soon as he is ready from a therapy standpoint, stating he is motivated to work but ready to d/c when appropriate. Discussed home setup, pt reports home should be w/c accessible but will confirm with doorway widths.  Pt propelled w/c to ADL apt with BUE strengthening and endurence.  Engaged in stand pivot transfers w/c > shower chair with legs placed over shower ledge.  Pt completed to R, in to shower, with CGA but then required mod assist when transferring to L due to decreased hand hold options on L and L lean.  Engaged in blocked practice sit > stand and  stand pivot transfers with LUE support on wall to simulate bracing on shower wall vs additional grab bar placement in same location.  Pt able to complete with CGA to both R and L.  Pt returned to room and completed squat pivot transfer CGA back to bed.  Pt left semi-reclined in bed with all needs in reach.  Therapy Documentation Precautions:  Precautions Precautions: Fall,Other (comment) Precaution Comments: Diplopia, L lean Restrictions Weight Bearing Restrictions: No General:   Vital Signs: Therapy Vitals Temp: 97.8 F (36.6 C) Pulse Rate: 92 Resp: 18 BP: 106/71 Patient Position (if appropriate): Lying Oxygen Therapy SpO2: (!) 88 % O2 Device: Room Air Pain:  Pt with no c/o pain   Therapy/Group: Individual Therapy  Rosalio Loud 10/27/2020, 4:01 PM

## 2020-10-28 LAB — GLUCOSE, CAPILLARY
Glucose-Capillary: 100 mg/dL — ABNORMAL HIGH (ref 70–99)
Glucose-Capillary: 105 mg/dL — ABNORMAL HIGH (ref 70–99)
Glucose-Capillary: 95 mg/dL (ref 70–99)
Glucose-Capillary: 101 mg/dL — ABNORMAL HIGH (ref 70–99)

## 2020-10-28 NOTE — Progress Notes (Signed)
Patient has had an uneventful shift, denies pain or discomfort this morning,slept the majority of the night. Respiration unlabored on room air,color normal,able to verbalize his needs to staff. Call bell within reach and bed alarm on, monitor and assisted, continue regime.

## 2020-10-28 NOTE — Progress Notes (Signed)
Patient ID: Dennis Fetterman Sr., male   DOB: 08-19-1955, 65 y.o.   MRN: [id number]  Met with pt and spoke with wife via telephone to discuss team conference goals of supervision-min level and target discharge date of 5/20. This is dependent upon wife coming in and feeling can manage pt's care at home. She is to get back with this worker to let me know when she can be here for education, she is having to coordinate with their business. She has found wheelchair and tub seat to borrow from friends so will not need one. Will await her return call for scheduling family education

## 2020-10-28 NOTE — Progress Notes (Signed)
Occupational Therapy Session Note  Patient Details  Name: Dennis Strickland. MRN: [id number] Date of Birth: 01/19/1956  Today's Date: 10/28/2020 OT Individual Time: 8032-1224 OT Individual Time Calculation (min): 58 min    Short Term Goals: Week 1:  OT Short Term Goal 1 (Week 1): Patient will complete toileting with Min A. OT Short Term Goal 2 (Week 1): Patient will complete LB dressing/bathing min A with AE PRN. OT Short Term Goal 3 (Week 1): Patient will complete dynamic sitting balance ADL with no more than min verbal cues to maintain midline posture. OT Short Term Goal 4 (Week 1): Patient will complete stand pivot transfers min A consistently.  Skilled Therapeutic Interventions/Progress Updates:    Treatment session with focus on functional mobility, standing balance, and visual scanning.  Pt received seated EOB agreeable to therapy session.  Pt changed shirts with setup and then donned and fastened shoes with setup.  Pt completed squat pivot transfer bed > w/c with CGA.  Pt propelled w/c to sink to complete oral care without assistance.  Engaged in [place] in standing with focus on trunk control and standing balance as pt with tendency to lean L, increasing as he fatigues.  Incorporated ambulating forward and backward ~4 feet with RW, as pt reports will need to walk in to bathroom.  Pt able to complete short distance ambulation w/c > BITS with RW with min assist with minimal lean L.  Pt completed visual scanning and alternating attention tasks on BITS while standing with Min assist while reaching with alternating UE.  Pt with increased L lean as he fatigued.  Noted mild intention tremor with LUE when reaching with LUE.  Completed 9 hole peg test R: 28.17 and L: 31.76 seconds.  Pt reports mild impaired coordination on LUE.  Engaged in in-hand manipulation and translation with pegs with focus on more fine motor and precision movements.  Pt returned to room and completed squat pivot transfer Mod I.  Pt  returned to supine and left with all needs in reach.  Therapy Documentation Precautions:  Precautions Precautions: Fall,Other (comment) Precaution Comments: Diplopia, L lean Restrictions Weight Bearing Restrictions: No General:   Vital Signs: Therapy Vitals Temp: 98.3 F (36.8 C) Temp Source: Oral Pulse Rate: 66 Resp: 16 BP: 114/75 Oxygen Therapy SpO2: 97 % O2 Device: Room Air Pain:  Pt with no c/o pain   Therapy/Group: Individual Therapy  Rosalio Loud 10/28/2020, 2:20 PM

## 2020-10-28 NOTE — Progress Notes (Signed)
Physical Therapy Session Note  Patient Details  Name: Dennis Kulpa Sr. MRN: [id number] Date of Birth: 09-05-1955  Today's Date: 10/28/2020 PT Individual Time: 1000-1053 PT Individual Time Calculation (min): 53 min   Short Term Goals: Week 1:  PT Short Term Goal 1 (Week 1): pt will transfer bed<>chair with LRAD and min A PT Short Term Goal 2 (Week 1): pt will ambulate 20ft with LRAD and mod A of 1 PT Short Term Goal 3 (Week 1): Pt will initate stair navigation  Skilled Therapeutic Interventions/Progress Updates:   Received pt supine in bed, pt agreeable to therapy, and denied any pain during session. Session with emphasis on functional mobility/transfers, generalized strengthening, dynamic standing balance/coordination, gait training, curb navigation, and improved activity tolerance. Pt transferred supine<>sitting EOB with supervision and transferred bed<>WC stand<>pivot to L with CGA. Pt performed WC mobility 120ft x 1, 80ft x 1, 83ft x 1, and 128ft x 1 using BUE and supervision throughout session. In rehab apartment pt transferred sit<>stand with RW and min A and walked to/from bathroom (~77ft) with RW and min/mod A to simulate home bathroom set up as pt reports his WC will not fit into bathroom. Pt with continued L lateral lean requiring min cues to weight shift to R. Pt with mild increase in L lean when turning towards toilet but aware. In therapy gym, pt navigated 1 6in curb with RW and min/mod A overall with +2 for safety. Educated pt on technique and AD management and pt verbalized confidence with technique from previous TKA. Pt with mild L lateral lean but good awareness and attempts to correct. Pt then ambulated 70ft x 1 and 18ft x 1 with RW and min/mod A with +2 for safety with emphasis on pt's ability to self-recognize fatigue and increase in lean indicating need to sit and rest. Pt able to set up transfer to/from various surfaces with supervision and occasional min cues for WC placement  throughout session. Pt began UE/LE strengthening on Nustep but then reported urge to urinate. Nustep<>WC squat<>pivot with CGA and transported to bathroom in WC total A. Pt transferred onto regular toilet with CGA and use of grab bars and able to manage clothing and void mod I. Pt transported back to room in [facility] total A and transferred WC<>bed stand<>pivot with CGA and sit<>supine with supervision. Discussed need for family education prior to D/C. Concluded session with pt supine in bed, needs within reach, and bed alarm on. CSW notified of pt's equipment needs for discharge.   Therapy Documentation Precautions:  Precautions Precautions: Fall,Other (comment) Precaution Comments: Diplopia, L lean Restrictions Weight Bearing Restrictions: No  Therapy/Group: Individual Therapy Martin Majestic PT, DPT   10/28/2020, 7:20 AM

## 2020-10-28 NOTE — Progress Notes (Signed)
PROGRESS NOTE   Subjective/Complaints: Patient's chart reviewed- No issues reported overnight Vitals signs stable Hopes to discharge home this week. Ambulating long distances, discussed we can d/c his Lovenox  ROS: +diplopia, +facial numbness, +nausea   Objective:   No results found. No results for input(s): WBC, HGB, HCT, PLT in the last 72 hours. No results for input(s): NA, K, CL, CO2, GLUCOSE, BUN, CREATININE, CALCIUM in the last 72 hours.  Intake/Output Summary (Last 24 hours) at 10/28/2020 0856 Last data filed at 10/28/2020 0818 Gross per 24 hour  Intake 737 ml  Output 1625 ml  Net -888 ml        Physical Exam: Vital Signs Blood pressure 113/71, pulse 61, temperature 98.3 F (36.8 C), temperature source Oral, resp. rate 17, height 6\' 1"  (1.854 m), weight 118.8 kg, SpO2 99 %. Gen: no distress, normal appearing HEENT: oral mucosa pink and moist, NCAT Cardio: Reg rate Chest: normal effort, normal rate of breathing Abd: soft, non-distended Ext: no edema Psych: pleasant, normal affect Skin: intact Musculoskeletal:  General: No swelling.  Cervical back: Normal range of motion.  Right lower leg: No edema.  Left lower leg: No edema.  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  Coloration: Skin is not jaundicedor pale.  Neurological:  Comments: Alert and oriented x 3. Normal insight and awareness. Intact Memory. Normal language and speech. Diplopia, dysphonia. Left hemi-facial sensory loss. Decreased depth perception with FTN, HTS. Strength nearly 5/5 in UE and LE bilaterally. Sensed pain and light touch in all 4 limbs.Strong left lateral lean Psychiatric:  Mood and Affect: Moodnormal.  Behavior: Behaviornormal.   Assessment/Plan: 1. Functional deficits which require 3+ hours per day of interdisciplinary therapy in a comprehensive  inpatient rehab setting.  Physiatrist is providing close team supervision and 24 hour management of active medical problems listed below.  Physiatrist and rehab team continue to assess barriers to discharge/monitor patient progress toward functional and medical goals  Care Tool:  Bathing    Body parts bathed by patient: Right arm,Left arm,Chest,Abdomen,Front perineal area,Buttocks,Right upper leg,Left upper leg,Right lower leg,Left lower leg,Face         Bathing assist Assist Level: Set up assist     Upper Body Dressing/Undressing Upper body dressing   What is the patient wearing?: Pull over shirt    Upper body assist Assist Level: Set up assist    Lower Body Dressing/Undressing Lower body dressing      What is the patient wearing?: Pants,Underwear/pull up     Lower body assist Assist for lower body dressing: Set up assist     Toileting Toileting    Toileting assist Assist for toileting: Minimal Assistance - Patient > 75% (Used urinal)     Transfers Chair/bed transfer  Transfers assist     Chair/bed transfer assist level: Contact Guard/Touching assist     Locomotion Ambulation   Ambulation assist      Assist level: 2 helpers Assistive device: Other (comment) (maxisky) Max distance: 85ft   Walk 10 feet activity   Assist     Assist level: 2 helpers Assistive device: Lite Gait   Walk 50 feet activity  Assist Walk 50 feet with 2 turns activity did not occur: Safety/medical concerns (L lean, decreased balance, visual impairments)  Assist level: 2 helpers Assistive device: Lite Gait    Walk 150 feet activity   Assist Walk 150 feet activity did not occur: Safety/medical concerns (L lean, decreased balance, visual impairments)  Assist level: 2 helpers Assistive device: Lite Gait    Walk 10 feet on uneven surface  activity   Assist Walk 10 feet on uneven surfaces activity did not occur: Safety/medical concerns (L lean, decreased  balance, visual impairments)         Wheelchair     Assist Will patient use wheelchair at discharge?: Yes Type of Wheelchair: Manual    Wheelchair assist level: Supervision/Verbal cueing Max wheelchair distance: >149ft    Wheelchair 50 feet with 2 turns activity    Assist        Assist Level: Supervision/Verbal cueing   Wheelchair 150 feet activity     Assist      Assist Level: Supervision/Verbal cueing   Blood pressure 113/71, pulse 61, temperature 98.3 F (36.8 C), temperature source Oral, resp. rate 17, height 6\' 1"  (1.854 m), weight 118.8 kg, SpO2 99 %.    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 Impaired mobility: d/c Lovenox since ambulating >700 feet. Discussed with patient.  -antiplatelet therapy: Aspirin 325 mg daily and Plavix 75 mg dailyx3 months then Plavix alone 3. Pain Management:Denies d/c Neurontin 300 mg daily. Discussed with patient.  4. Mood:Provide emotional support -antipsychotic agents: N/A 5. Neuropsych: This patientiscapable of making decisions on hisown behalf. 6. Skin/Wound Care:Routine skin checks 7. Fluids/Electrolytes/Nutrition:pt has good appetite, d/c ensure supplements -electrolytes stable, no need to recheck.  8. Hyperlipidemia. Continue Crestor 9. Prediabetes. Hemoglobin A1c 5.8. SSI. Advised no added sugar/bread/pasta/rice and high intake of vegetable/nuts/olive oil in diet. Reviewed CBGs with him.  10. BPH. ContinueProscar 5 mg daily -monitor voiding patterns 11. Nausea: zofran prescribed 12. Diplopia: continue tape over left eyeglass lens, helping 13.  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 Obesity BMI 34.55: provided dietary education  LOS: 7 days A FACE TO FACE EVALUATION WAS PERFORMED  Dennis Strickland 10/28/2020, 8:56 AM

## 2020-10-28 NOTE — Progress Notes (Signed)
Occupational Therapy Session Note  Patient Details  Name: Dennis Cuadra Sr. MRN: [id number] Date of Birth: 09-Sep-1955  Today's Date: 10/28/2020 OT Individual Time: 0964-3838 OT Individual Time Calculation (min): 57 min    Short Term Goals: Week 1:  OT Short Term Goal 1 (Week 1): Patient will complete toileting with Min A. OT Short Term Goal 2 (Week 1): Patient will complete LB dressing/bathing min A with AE PRN. OT Short Term Goal 3 (Week 1): Patient will complete dynamic sitting balance ADL with no more than min verbal cues to maintain midline posture. OT Short Term Goal 4 (Week 1): Patient will complete stand pivot transfers min A consistently.  Skilled Therapeutic Interventions/Progress Updates:    Pt received in bed and consented to OT tx. Pt seen for instruction and training in shower transfers, attempted to simulate home shower setup as best as possible. Instructed pt in proper RW placement to get in and out of shower and for proper hand placement. Pt req CGA-min A during shower transfer in and out as he has L lean during transitions. Once back in w/c pt instructed to maneuver w/c in small spaces and tight turns, wheeled to elevators and outside on various surfaces. Pt instructed in 3#db strengthening HEP to increase strength and activity tolerance for ADLs. Pt instructed in bicep curls, shoulder press, shoulder flexion, and chest press, all for 3x15 with min cuing for proper technique with good carryover. Pt instructed to wheel self back to room with supervision. After tx, pt helped back to bed with SPT and CGA. Left with bed alarm on and all needs met.   Therapy Documentation Precautions:  Precautions Precautions: Fall,Other (comment) Precaution Comments: Diplopia, L lean Restrictions Weight Bearing Restrictions: No Vital Signs: Therapy Vitals Temp: 98.3 F (36.8 C) Temp Source: Oral Pulse Rate: 66 Resp: 16 BP: 114/75 Oxygen Therapy SpO2: 97 % O2 Device: Room  Air Pain: Pain Assessment Pain Scale: 0-10 Pain Score: 0-No pain   Therapy/Group: Individual Therapy  [name] 10/28/2020, 3:43 PM

## 2020-10-29 LAB — GLUCOSE, CAPILLARY
Glucose-Capillary: 97 mg/dL (ref 70–99)
Glucose-Capillary: 104 mg/dL — ABNORMAL HIGH (ref 70–99)
Glucose-Capillary: 104 mg/dL — ABNORMAL HIGH (ref 70–99)
Glucose-Capillary: 76 mg/dL (ref 70–99)

## 2020-10-29 NOTE — Progress Notes (Signed)
Occupational Therapy Session Note  Patient Details  Name: Dennis Strickland. MRN: [id number] Date of Birth: [birth year]  Today's Date: 10/29/2020 OT Individual Time: 9470-9628 and 3662-9476 OT Individual Time Calculation (min): 55 min and 43 min   Short Term Goals: Week 1:  OT Short Term Goal 1 (Week 1): Patient will complete toileting with Min A. OT Short Term Goal 2 (Week 1): Patient will complete LB dressing/bathing min A with AE PRN. OT Short Term Goal 3 (Week 1): Patient will complete dynamic sitting balance ADL with no more than min verbal cues to maintain midline posture. OT Short Term Goal 4 (Week 1): Patient will complete stand pivot transfers min A consistently.  Skilled Therapeutic Interventions/Progress Updates:    1) Treatment session with focus on self-care retraining, ambulatory transfers, dynamic standing balance, and trunk control.  Pt received seated EOB reporting mild nausea but agreeable to shower this session.  Pt ambulated to room shower with RW with CGA. Pt utilized transfer bench with supervision.  Pt completed bathing and dressing at setup level with shower curtain closed due to modesty. Pt completed transfer out of shower CGA with RW and ambulated to sink with CGA with RW.  Pt completed oral care from sitting due to nausea.  Engaged in trunk control in sitting and standing with focus on weight shifting and correcting L lean as it presents.  Engaged in reaching outside BOS in sitting and standing with CGA for trunk control when in standing.  Pt demonstrating improved trunk stability and decreased L lean throughout dynamic standing tasks.  Pt returned to room and completed stand pivot transfer with RW with CGA to bed.  Pt remained seated EOB with all needs in reach.  2)  Treatment session with focus on functional transfers, sit > stand, and d/c planning.  Pt received seated EOB agreeable to therapy session.  Pt completed squat pivot transfer CGA to w/c.  Pt propelled w/c  outside while transporting on/off elevators demonstrating improved awareness and sequencing navigating various thresholds and surfaces.  Engaged in 5x sit > stand from w/c with pt standing x5 in 16.7 seconds with UE support.  Pt demonstrating decreased L lean with sit > stand and when standing.  Engaged in discussion about upcoming d/c and family education.  Pt reports wife concerned about his ability to complete self-care tasks, transfers, and navigate steps in home.  Discussed vision impairments and recommendation for timeline regarding neuro- optometrist visit.  Pt returned to room and transferred stand pivot back to bed close supervision.  Pt remained seated EOB with all needs in reach.   Therapy Documentation Precautions:  Precautions Precautions: Fall,Other (comment) Precaution Comments: Diplopia, L lean Restrictions Weight Bearing Restrictions: No General: General PT Missed Treatment Reason: Patient ill (Comment) (nausea) Vital Signs: Therapy Vitals Temp: (!) 97.4 F (36.3 C) Pulse Rate: 81 Resp: 19 BP: (!) 130/49 Patient Position (if appropriate): Sitting Oxygen Therapy SpO2: 99 % O2 Device: Room Air Pain:   ADL: ADL Eating: Set up Where Assessed-Eating: Bed level Grooming: Setup Where Assessed-Grooming: Sitting at sink,Wheelchair Upper Body Bathing: Contact guard Where Assessed-Upper Body Bathing: Shower (TTB) Lower Body Bathing: Contact guard Where Assessed-Lower Body Bathing: Shower (TTB) Upper Body Dressing: Minimal assistance Where Assessed-Upper Body Dressing: Other (Comment) (Sitting in shower (TTB)) Lower Body Dressing: Moderate assistance Where Assessed-Lower Body Dressing: Other (Comment) (Sitting and in stance in shower (TTB, grab bars)) Toileting: Moderate assistance Where Assessed-Toileting: Bedside Commode Toilet Transfer: Moderate assistance Toilet Transfer Method: Sit pivot,Squat pivot Toilet  Transfer Equipment: Drop arm bedside commode,Grab  bars Film/video editor: Moderate assistance Film/video editor Method: Sit pivot,Squat pivot Astronomer: Herbalist   Exercises:   Other Treatments:     Therapy/Group: Individual Therapy  Rosalio Loud 10/29/2020, 3:21 PM

## 2020-10-29 NOTE — Discharge Summary (Signed)
Physician Discharge Summary  Patient ID: Dennis Tremaine Sr. MRN: [id number] DOB/AGE: 09/11/1955 65 y.o.  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 COMPARISON:  10/17/2020 brain MRI FINDINGS: Brain: There is a new focus of abnormal diffusion restriction within the left dorsal medulla oblongata (5:56). No other diffusion abnormality. No acute hemorrhage. No mass effect. There is multifocal hyperintense T2-weighted signal within the white matter. Parenchymal volume and CSF spaces are normal. The midline structures are normal. Vascular: Major flow voids are preserved. Skull and upper cervical spine: Normal calvarium and skull base. Visualized upper cervical spine and soft tissues are normal. Sinuses/Orbits:No paranasal sinus fluid levels or advanced mucosal thickening. No mastoid or middle ear effusion. Normal orbits. IMPRESSION: New focus of abnormal diffusion restriction within the left dorsal medulla oblongata, consistent with acute to subacute infarct. Electronically Signed   By: Ulyses Jarred M.D.   On: 10/18/2020 01:01   MR BRAIN WO CONTRAST  Result Date: 10/17/2020 CLINICAL DATA:  Initial evaluation for acute stroke. EXAM: MRI HEAD WITHOUT CONTRAST TECHNIQUE: Multiplanar, multiecho pulse sequences of the brain and surrounding structures were obtained without intravenous contrast. COMPARISON:  Prior studies from earlier the same day as well as previous MRI from 03/05/2020. FINDINGS: Brain: Examination degraded by motion  artifact. Generalized age-related cerebral atrophy. Patchy T2/FLAIR hyperintensity within the periventricular and deep white matter both cerebral hemispheres most consistent with chronic small vessel ischemic disease, moderate in nature. Superimposed remote lacunar infarct present at the right thalamus. No abnormal foci of restricted diffusion to suggest acute or subacute ischemia. Gray-white matter differentiation maintained. No encephalomalacia to suggest chronic cortical infarction. No evidence for acute or chronic intracranial hemorrhage. No mass lesion, midline shift or mass effect. No hydrocephalus or extra-axial fluid collection. Pituitary gland and suprasellar region within normal limits. Midline structures intact. Vascular: Absent flow void within the left V4 segment, consistent with previously identified occlusion. Major intracranial vascular flow voids are otherwise maintained. Skull and upper cervical spine: Craniocervical junction within normal limits. Upper cervical spine normal. Bone marrow signal intensity within normal limits. No focal marrow replacing lesion. No scalp soft tissue abnormality. Sinuses/Orbits: Globes orbital soft tissues demonstrate no acute finding. Mild scattered mucosal thickening noted within the ethmoidal air cells and maxillary sinuses. Paranasal sinuses are otherwise clear. No mastoid effusion. Inner ear structures grossly normal. Other: None. IMPRESSION: 1. No acute intracranial abnormality. 2. 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 Overall, appearance is similar as compared to previous MRI from 03/05/2020. Electronically Signed   By: Jeannine Boga M.D.   On: 10/17/2020 04:23   IR US Guide Vasc Access Right  Result Date: 10/21/2020 INDICATION: Mr. Dennis Barren Sr. is a 65 year old male with history of hypertension, hyperlipidemia, TIA 02/2020 admitted for  transient left temporal headache, vertigo, swallowing difficulty, hoarseness, left-sided numbness and discoordination after 2 sneezes. No tPA given due to mild symptoms. 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 He comes to our service today for  a diagnostic cerebral angiogram to evaluate intracranial vasculature. EXAM: ULTRASOUND-GUIDED VASCULAR ACCESS DIAGNOSTIC CEREBRAL ANGIOGRAM COMPARISON:  CT angiogram of the head and neck Oct 17, 2020 MEDICATIONS: No antibiotics. ANESTHESIA/SEDATION: Versed 1.5 mg IV; Fentanyl 50 mcg IV; 5 mg hydralazine Moderate Sedation Time:  88 minutes The patient was continuously monitored during the procedure by the interventional radiology nurse under my direct supervision. CONTRAST:  110 mL of Omnipaque 240 milligram/mL FLUOROSCOPY TIME:  Fluoroscopy Time: 30 minutes 24 seconds (1,400 mGy). COMPLICATIONS: None immediate. TECHNIQUE: Informed written consent was obtained from the patient after a thorough discussion of the procedural risks, benefits and alternatives. All questions were addressed. Maximal Sterile Barrier Technique was utilized including caps, mask, sterile gowns, sterile gloves, sterile drape, hand hygiene and skin antiseptic. A timeout was performed prior to the initiation of the procedure. The right groin was prepped and draped in the usual sterile fashion. Using a micropuncture kit and the modified Seldinger technique, access was gained to the right common femoral artery and a 5 French sheath was placed. Under fluoroscopy, Berenstein 2 catheter was navigated over a 0.035" Terumo Glidewire into the aortic arch. The catheter was placed into the left subclavian artery . Frontal and lateral angiograms of the neck and skull base were obtained. Attempted catheterization of the cervical left vertebral artery proved unsuccessful due to severe tortuosity. The  catheter was then placed into the right common carotid artery. Frontal and lateral angiograms of the neck. Using biplane roadmap, the catheter was advanced into the right internal carotid artery. Frontal and lateral angiograms of the head were obtained. The catheter was then retracted into the innominate artery. Frontal angiograms of the neck were obtained. Attempted catheterization of the right vertebral artery with multiple catheters proved unsuccessful. The catheter was again placed the left subclavian artery. Frontal angiogram of the head was obtained. The catheter was subsequently placed into the left common carotid artery. Frontal and lateral angiograms of the neck were obtained. Under biplane roadmap, the catheter was advanced into the left internal carotid artery. Frontal and lateral angiograms of the head were obtained. Using the modified Seldinger technique and a micropuncture kit, access was gained to the distal right radial artery at the anatomical snuffbox and a 5 French sheath was placed. Slow intra arterial infusion of 5,000 IU heparin, 5 mg Verapamil and 585 mcg nitroglicerin diluted in patient's own blood was performed. No significant fluctuation in patient's blood pressure seen. Then, a right radial artery roadmap was obtained via sheath side port. Next, a 5 [place] Berenstein 2 catheter was navigated over a 0.035" Terumo Glidewire into the right subclavian artery under fluoroscopic guidance. Frontal and lateral angiograms of the neck were obtained. Under biplane roadmap, the catheter was advanced into the right vertebral artery. Frontal and lateral views of the head were obtained followed by bilateral magnified oblique views. The catheter was subsequently withdrawn. Right common femoral angiograms with right anterior oblique and lateral views were obtained. Then, a 5 [place] Exoseal was utilized for femoral access closure. Hemostasis obtained after 5 minutes of manual pressure hold. An inflatable  band was placed and inflated over the right hand access site. The vascular sheath was withdrawn and the band was slowly deflated until brisk flow was noted through the arteriotomy site. At this point, the band was reinflated with additional 2 cc of air to obtain patent hemostasis. FINDINGS: Left subclavian angiograms: Atherosclerotic changes and increased tortuosity of the left subclavian artery without hemodynamically significant stenosis. There is severe tortuosity of the  V1 segment of the left vertebral artery with mild stenosis at its origin. Opacification of the left vertebral artery up to the V3 segment is noted. Faint opacification of the left PICA seen. Right CCA angiograms: Cervical angiograms show normal course and caliber of the visualized right common carotid and internal carotid arteries. Mild atherosclerotic changes at the right carotid bulb without significant stenosis. Right ICA angiograms: Diffuse luminal irregularity of the intracranial vasculature, consistent with intracranial atherosclerotic disease. Focal stenosis is seen at the petrous of approximately 65-70%. Multifocal areas of mild stenosis are seen in the right ACA vascular tree. There is brisk vascular contrast filling of the ACA and MCA vascular trees. No aneurysms or abnormally high-flow, early draining veins are seen. No regions of abnormal hypervascularity are noted. The visualized dural sinuses are patent. Innominate artery angiogram: Severe tortuosity of the innominate, proximal right subclavian and proximal right common carotid artery without stenosis. Left CCA angiograms: Cervical angiograms show normal course and caliber of the visualized left common carotid and internal carotid arteries. Mild atherosclerotic changes of the left carotid bulb without hemodynamically significant stenosis. Left ICA angiograms: Diffuse luminal irregularity of the intracranial vasculature with multifocal areas of mild stenosis in the cavernous right  ICA and left MCA vascular tree. Short segment of severe stenosis is seen at the M3-M4 segment of the left angular branch. There multiple areas of mild-to-moderate stenosis along the left ACA vascular tree. There is brisk vascular contrast filling of the the ACA and MCA vascular trees. No aneurysms or abnormally high-flow, early draining veins are seen. No regions of abnormal hypervascularity are noted. The visualized dural sinuses are patent. Right subclavian angiograms: Increased tortuosity of the V1 segment of the left vertebral artery without hemodynamically significant stenosis. Right vertebral artery angiograms: Luminal irregularities are seen along the intracranial right vertebral artery, right PICA, basilar artery, bilateral superior cerebellar arteries and posterior cerebral arteries consistent with intracranial atherosclerotic disease. There is moderate stenosis at the origin of the right PICA and mild stenosis at the proximal basilar artery. Faint opacification of the very distal aspect of the intracranial left vertebral artery, at the vertebrobasilar junction, is seen by contrast reflux in appear severely stenotic. No opacification beyond a few mm, consistent with occlusion. No aneurysms or abnormally high-flow, early draining veins are seen. No regions of abnormal hypervascularity are noted. The visualized dural sinuses are patent. Right common femoral artery angiograms: Normal course and caliber of the right external iliac, right common femoral artery and femoral bifurcation. Right radial artery ultrasound and right radial artery angiogram: The caliber of the distal right radial artery is appropriate for angiogram access. The right radial artery and the right ulnar artery have normal course and caliber. No significant anatomical variants noted. PROCEDURE: No intervention performed. IMPRESSION: 1. 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 2. No hemodynamically significant stenosis at the neck. PLAN: Continue follow-up with neurology. In case the right ICA stenosis becomes symptomatic and refractory to medical treatment, intracranial angioplasty and stenting can be performed. Electronically Signed   By: Pedro Earls M.D.   On: 10/21/2020 16:50   ECHOCARDIOGRAM COMPLETE  Result Date: 10/18/2020    ECHOCARDIOGRAM REPORT   Patient Name:   Dennis Kissinger Sr. Date of Exam: 10/18/2020 Medical Rec #:  [id number]        Height:       73.0  in Accession #:    [id number]       Weight:       265.0 lb Date of Birth:  Oct 20, 1955        BSA:          2.425 m Patient Age:    62 years         BP:           117/52 mmHg Patient Gender: M                HR:           67 bpm. Exam Location:  Inpatient Procedure: 2D Echo Indications:    TIA  History:        Patient has no prior history of Echocardiogram examinations.                 Risk Factors:Hypertension and Dyslipidemia.  Sonographer:    Johny Chess Referring Phys: [place]  1. Left ventricular ejection fraction, by estimation, is 60 to 65%. The left ventricle has normal function. The left ventricle has no regional wall motion abnormalities. There is mild left ventricular hypertrophy. Left ventricular diastolic parameters are consistent with Grade I diastolic dysfunction (impaired relaxation).  2. Right ventricular systolic function is normal. The right ventricular size is normal.  3. The mitral valve is normal in structure. Trivial mitral valve regurgitation. No evidence of mitral stenosis.  4. The aortic valve is tricuspid. There is mild thickening of the aortic valve. Aortic valve regurgitation is not visualized. No aortic stenosis is present. Conclusion(s)/Recommendation(s): No intracardiac source of embolism detected on this transthoracic study. A  transesophageal echocardiogram is recommended to exclude cardiac source of embolism if clinically indicated. FINDINGS  Left Ventricle: Left ventricular ejection fraction, by estimation, is 60 to 65%. The left ventricle has normal function. The left ventricle has no regional wall motion abnormalities. The left ventricular internal cavity size was normal in size. There is  mild left ventricular hypertrophy. Left ventricular diastolic parameters are consistent with Grade I diastolic dysfunction (impaired relaxation). Right Ventricle: The right ventricular size is normal. No increase in right ventricular wall thickness. Right ventricular systolic function is normal. Left Atrium: Left atrial size was normal in size. Right Atrium: Right atrial size was normal in size. Pericardium: There is no evidence of pericardial effusion. Mitral Valve: The mitral valve is normal in structure. Trivial mitral valve regurgitation. No evidence of mitral valve stenosis. Tricuspid Valve: The tricuspid valve is normal in structure. Tricuspid valve regurgitation is trivial. No evidence of tricuspid stenosis. Aortic Valve: The aortic valve is tricuspid. There is mild thickening of the aortic valve. Aortic valve regurgitation is not visualized. No aortic stenosis is present. Pulmonic Valve: The pulmonic valve was normal in structure. Pulmonic valve regurgitation is trivial. No evidence of pulmonic stenosis. Aorta: The aortic root is normal in size and structure. Venous: The inferior vena cava was not well visualized. IAS/Shunts: The interatrial septum appears to be lipomatous. No atrial level shunt detected by color flow Doppler.  LEFT VENTRICLE PLAX 2D LVIDd:         4.90 cm  Diastology LVIDs:         3.00 cm  LV e' lateral:   7.51 cm/s LV PW:         1.10 cm  LV E/e' lateral: 12.6 LV IVS:        0.90 cm LVOT diam:     2.40 cm LV SV:  116 LV SV Index:   48 LVOT Area:     4.52 cm  RIGHT VENTRICLE TAPSE (M-mode): 2.2 cm LEFT ATRIUM              Index       RIGHT ATRIUM           Index LA diam:        4.20 cm 1.73 cm/m  RA Area:     12.30 cm LA Vol (A2C):   77.6 ml 32.00 ml/m RA Volume:   25.30 ml  10.43 ml/m LA Vol (A4C):   54.6 ml 22.52 ml/m LA Biplane Vol: 65.3 ml 26.93 ml/m  AORTIC VALVE LVOT Vmax:   124.00 cm/s LVOT Vmean:  80.200 cm/s LVOT VTI:    0.256 m  AORTA Ao Root diam: 3.60 cm Ao Asc diam:  3.40 cm MITRAL VALVE                TRICUSPID VALVE MV Area (PHT): 3.08 cm     TR Peak grad:   21.3 mmHg MV Decel Time: 246 msec     TR Vmax:        231.00 cm/s MV E velocity: 94.30 cm/s MV A velocity: 118.00 cm/s  SHUNTS MV E/A ratio:  0.80         Systemic VTI:  0.26 m                             Systemic Diam: 2.40 cm Cherlynn Kaiser MD Electronically signed by Cherlynn Kaiser MD Signature Date/Time: 10/18/2020/5:01:51 PM    Final    CT HEAD CODE STROKE WO CONTRAST  Result Date: 10/17/2020 CLINICAL DATA:  Code stroke. Initial evaluation for neuro deficit, stroke suspected. EXAM: CT HEAD WITHOUT CONTRAST TECHNIQUE: Contiguous axial images were obtained from the base of the skull through the vertex without intravenous contrast. COMPARISON:  None. FINDINGS: Brain: Age-related cerebral atrophy with moderate chronic microvascular ischemic disease. Small remote lacunar infarct at the right thalamus. No acute intracranial hemorrhage. No acute large vessel territory infarct. No mass lesion, midline shift or mass effect. No hydrocephalus or extra-axial fluid collection. Vascular: No hyperdense vessel. Skull: Scalp soft tissues within normal limits.  Calvarium intact. Sinuses/Orbits: Globes and orbital soft tissues demonstrate no acute finding. Paranasal sinuses are largely clear. No mastoid effusion. Other: None. ASPECTS [facility] Stroke Program Early CT Score) - Ganglionic level infarction (caudate, lentiform nuclei, internal capsule, insula, M1-M3 cortex): 7 - Supraganglionic infarction (M4-M6 cortex): 3 Total score (0-10 with 10 being normal): 10  IMPRESSION: 1. No acute intracranial infarct or other abnormality. 2. ASPECTS is 10. 3. Age-related cerebral atrophy with chronic small vessel ischemic disease. These results were communicated to Dr. Rory Percy at 2:35 amon 5/6/2022by text page via the [facility] messaging system. Electronically Signed   By: Jeannine Boga M.D.   On: 10/17/2020 02:36   CT ANGIO HEAD NECK W WO CM W PERF (CODE STROKE)  Result Date: 10/17/2020 CLINICAL DATA:  Follow-up examination for acute stroke. EXAM: CT ANGIOGRAPHY HEAD AND NECK CT PERFUSION BRAIN TECHNIQUE: Multidetector CT imaging of the head and neck was performed using the standard protocol during bolus administration of intravenous contrast. Multiplanar CT image reconstructions and MIPs were obtained to evaluate the vascular anatomy. Carotid stenosis measurements (when applicable) are obtained utilizing NASCET criteria, using the distal internal carotid diameter as the denominator. Multiphase CT imaging of the brain was performed following IV bolus contrast injection. Subsequent  parametric perfusion maps were calculated using RAPID software. CONTRAST:  35m OMNIPAQUE IOHEXOL 350 MG/ML SOLN COMPARISON:  Prior head CT from earlier the same day. FINDINGS: CTA NECK FINDINGS Aortic arch: Examination technically limited by motion and timing of the contrast bolus. Visualized aortic arch normal caliber with normal branch pattern. No stenosis about the origin of the great vessels. Right carotid system: Right CCA patent from its origin to the bifurcation. Mild mixed plaque about the right bifurcation without significant stenosis. Right ICA patent distally without stenosis, dissection or occlusion. Left carotid system: Left CCA patent from its origin to the bifurcation without stenosis. Mild mixed plaque about the left bifurcation without significant stenosis. Left ICA widely patent distally without stenosis, dissection or occlusion. Vertebral arteries: Both vertebral arteries arise from  subclavian arteries. Right vertebral artery dominant. Origin and proximal left vertebral artery not well evaluated due to adjacent venous contamination. Visualized portions of the vertebral arteries patent within the neck without appreciable stenosis or other acute vascular abnormality. Skeleton: No acute osseous abnormality. No discrete or worrisome osseous lesions. Other neck: No other visible acute soft tissue abnormality within the neck. No mass or adenopathy. Upper chest: Visualized upper chest demonstrates no acute finding. Review of the MIP images confirms the above findings CTA HEAD FINDINGS Anterior circulation: Focal severe stenosis involving the horizontal petrous right ICA (series 7, image 127). Petrous left ICA widely patent. Additional scattered plaque within the carotid siphons with no more than mild stenosis elsewhere. A1 segments patent bilaterally. Normal anterior communicating artery complex. Anterior cerebral arteries patent to their distal aspects without appreciable stenosis. No M1 stenosis or occlusion. Normal MCA bifurcations. Distal MCA branches well perfused. Posterior circulation: Dominant right V4 segment patent to the vertebrobasilar junction without stenosis. Right PICA patent. Left vertebral artery largely occludes just as it courses into the cranial vault. Left PICA is perfused at the skull base. Basilar patent to its distal aspect without stenosis. Superior cerebellar arteries patent bilaterally. Both PCAs primarily supplied via the basilar. Multifocal atheromatous irregularity seen throughout the PCAs bilaterally. Associated multifocal severe left P2 stenoses (series 10, image 21). PCAs otherwise perfused to their distal aspects without proximal stenosis. Venous sinuses: Grossly patent allowing for timing the contrast bolus. Anatomic variants: None significant. No visible aneurysm. Review of the MIP images confirms the above findings CT Brain Perfusion Findings: ASPECTS: 10. CBF  (<30%) Volume: 030mPerfusion (Tmax>6.0s) volume: 1m42mismatch Volume: 1mL2mfarction Location:Negative CT perfusion for acute ischemia. Delayed perfusion seen within the left cerebellar hemisphere related to the occluded left vertebral artery. IMPRESSION: CTA HEAD AND NECK IMPRESSION: 1. Negative CTA for emergent large vessel occlusion. 2. 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 3. Multifocal severe left P2 stenoses. 4. 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 Dominant right vertebral artery widely patent. 5. Mild atheromatous disease about the major arterial vasculature within the neck. No flow-limiting stenosis within the neck. CT PERFUSION IMPRESSION: 1. Negative CT perfusion for acute ischemia. 2. 7 cc perfusion deficit within the left cerebellar hemisphere, in keeping with the occluded left V4 segment. Electronically Signed   By: BenjJeannine Boga.   On: 10/17/2020 03:57   IR ANGIO INTRA EXTRACRAN SEL INTERNAL CAROTID BILAT MOD SED  Result Date: 10/21/2020 INDICATION: Mr. Dennis Strickland is a 65 y67r old male with history of hypertension, hyperlipidemia, TIA 02/2020 admitted for transient left temporal headache, vertigo, swallowing difficulty, hoarseness, left-sided numbness and discoordination after 2 sneezes. No tPA  given due to mild symptoms. 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 He comes to our service today for a diagnostic cerebral angiogram to evaluate intracranial vasculature. EXAM: ULTRASOUND-GUIDED VASCULAR ACCESS DIAGNOSTIC CEREBRAL ANGIOGRAM COMPARISON:  CT angiogram of the head and neck Oct 17, 2020 MEDICATIONS: No antibiotics. ANESTHESIA/SEDATION: Versed 1.5 mg IV; Fentanyl 50 mcg IV; 5 mg hydralazine Moderate Sedation Time:  88 minutes The patient was continuously monitored during the procedure by the  interventional radiology nurse under my direct supervision. CONTRAST:  110 mL of Omnipaque 240 milligram/mL FLUOROSCOPY TIME:  Fluoroscopy Time: 30 minutes 24 seconds (1,400 mGy). COMPLICATIONS: None immediate. TECHNIQUE: Informed written consent was obtained from the patient after a thorough discussion of the procedural risks, benefits and alternatives. All questions were addressed. Maximal Sterile Barrier Technique was utilized including caps, mask, sterile gowns, sterile gloves, sterile drape, hand hygiene and skin antiseptic. A timeout was performed prior to the initiation of the procedure. The right groin was prepped and draped in the usual sterile fashion. Using a micropuncture kit and the modified Seldinger technique, access was gained to the right common femoral artery and a 5 French sheath was placed. Under fluoroscopy, Berenstein 2 catheter was navigated over a 0.035" Terumo Glidewire into the aortic arch. The catheter was placed into the left subclavian artery . Frontal and lateral angiograms of the neck and skull base were obtained. Attempted catheterization of the cervical left vertebral artery proved unsuccessful due to severe tortuosity. The catheter was then placed into the right common carotid artery. Frontal and lateral angiograms of the neck. Using biplane roadmap, the catheter was advanced into the right internal carotid artery. Frontal and lateral angiograms of the head were obtained. The catheter was then retracted into the innominate artery. Frontal angiograms of the neck were obtained. Attempted catheterization of the right vertebral artery with multiple catheters proved unsuccessful. The catheter was again placed the left subclavian artery. Frontal angiogram of the head was obtained. The catheter was subsequently placed into the left common carotid artery. Frontal and lateral angiograms of the neck were obtained. Under biplane roadmap, the catheter was advanced into the left internal carotid  artery. Frontal and lateral angiograms of the head were obtained. Using the modified Seldinger technique and a micropuncture kit, access was gained to the distal right radial artery at the anatomical snuffbox and a 5 French sheath was placed. Slow intra arterial infusion of 5,000 IU heparin, 5 mg Verapamil and 161 mcg nitroglicerin diluted in patient's own blood was performed. No significant fluctuation in patient's blood pressure seen. Then, a right radial artery roadmap was obtained via sheath side port. Next, a 5 [place] Berenstein 2 catheter was navigated over a 0.035" Terumo Glidewire into the right subclavian artery under fluoroscopic guidance. Frontal and lateral angiograms of the neck were obtained. Under biplane roadmap, the catheter was advanced into the right vertebral artery. Frontal and lateral views of the head were obtained followed by bilateral magnified oblique views. The catheter was subsequently withdrawn. Right common femoral angiograms with right anterior oblique and lateral views were obtained. Then, a 5 [place] Exoseal was utilized for femoral access closure. Hemostasis obtained after 5 minutes of manual pressure hold. An inflatable band was placed and inflated over the right hand access site. The vascular sheath was withdrawn and the band was slowly deflated until brisk flow was noted through the arteriotomy site. At this  point, the band was reinflated with additional 2 cc of air to obtain patent hemostasis. FINDINGS: Left subclavian angiograms: Atherosclerotic changes and increased tortuosity of the left subclavian artery without hemodynamically significant stenosis. There is severe tortuosity of the V1 segment of the left vertebral artery with mild stenosis at its origin. Opacification of the left vertebral artery up to the V3 segment is noted. Faint opacification of the left PICA seen. Right CCA angiograms: Cervical angiograms show normal course and caliber of the visualized right common  carotid and internal carotid arteries. Mild atherosclerotic changes at the right carotid bulb without significant stenosis. Right ICA angiograms: Diffuse luminal irregularity of the intracranial vasculature, consistent with intracranial atherosclerotic disease. Focal stenosis is seen at the petrous of approximately 65-70%. Multifocal areas of mild stenosis are seen in the right ACA vascular tree. There is brisk vascular contrast filling of the ACA and MCA vascular trees. No aneurysms or abnormally high-flow, early draining veins are seen. No regions of abnormal hypervascularity are noted. The visualized dural sinuses are patent. Innominate artery angiogram: Severe tortuosity of the innominate, proximal right subclavian and proximal right common carotid artery without stenosis. Left CCA angiograms: Cervical angiograms show normal course and caliber of the visualized left common carotid and internal carotid arteries. Mild atherosclerotic changes of the left carotid bulb without hemodynamically significant stenosis. Left ICA angiograms: Diffuse luminal irregularity of the intracranial vasculature with multifocal areas of mild stenosis in the cavernous right ICA and left MCA vascular tree. Short segment of severe stenosis is seen at the M3-M4 segment of the left angular branch. There multiple areas of mild-to-moderate stenosis along the left ACA vascular tree. There is brisk vascular contrast filling of the the ACA and MCA vascular trees. No aneurysms or abnormally high-flow, early draining veins are seen. No regions of abnormal hypervascularity are noted. The visualized dural sinuses are patent. Right subclavian angiograms: Increased tortuosity of the V1 segment of the left vertebral artery without hemodynamically significant stenosis. Right vertebral artery angiograms: Luminal irregularities are seen along the intracranial right vertebral artery, right PICA, basilar artery, bilateral superior cerebellar arteries and  posterior cerebral arteries consistent with intracranial atherosclerotic disease. There is moderate stenosis at the origin of the right PICA and mild stenosis at the proximal basilar artery. Faint opacification of the very distal aspect of the intracranial left vertebral artery, at the vertebrobasilar junction, is seen by contrast reflux in appear severely stenotic. No opacification beyond a few mm, consistent with occlusion. No aneurysms or abnormally high-flow, early draining veins are seen. No regions of abnormal hypervascularity are noted. The visualized dural sinuses are patent. Right common femoral artery angiograms: Normal course and caliber of the right external iliac, right common femoral artery and femoral bifurcation. Right radial artery ultrasound and right radial artery angiogram: The caliber of the distal right radial artery is appropriate for angiogram access. The right radial artery and the right ulnar artery have normal course and caliber. No significant anatomical variants noted. PROCEDURE: No intervention performed. IMPRESSION: 1. 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 2. No hemodynamically significant stenosis at the neck. PLAN: Continue follow-up with neurology. In case the right ICA stenosis becomes symptomatic and refractory to medical treatment, intracranial angioplasty and stenting can be performed. Electronically Signed   By: Pedro Earls M.D.   On: 10/21/2020 16:50   IR ANGIO VERTEBRAL SEL SUBCLAVIAN  INNOMINATE UNI L MOD SED  Result Date: 10/21/2020 INDICATION: Mr. Dennis Italiano Sr. is a 65 year old male with history of hypertension, hyperlipidemia, TIA 02/2020 admitted for transient left temporal headache, vertigo, swallowing difficulty, hoarseness, left-sided numbness and discoordination  after 2 sneezes. No tPA given due to mild symptoms. 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 He comes to our service today for a diagnostic cerebral angiogram to evaluate intracranial vasculature. EXAM: ULTRASOUND-GUIDED VASCULAR ACCESS DIAGNOSTIC CEREBRAL ANGIOGRAM COMPARISON:  CT angiogram of the head and neck Oct 17, 2020 MEDICATIONS: No antibiotics. ANESTHESIA/SEDATION: Versed 1.5 mg IV; Fentanyl 50 mcg IV; 5 mg hydralazine Moderate Sedation Time:  88 minutes The patient was continuously monitored during the procedure by the interventional radiology nurse under my direct supervision. CONTRAST:  110 mL of Omnipaque 240 milligram/mL FLUOROSCOPY TIME:  Fluoroscopy Time: 30 minutes 24 seconds (1,400 mGy). COMPLICATIONS: None immediate. TECHNIQUE: Informed written consent was obtained from the patient after a thorough discussion of the procedural risks, benefits and alternatives. All questions were addressed. Maximal Sterile Barrier Technique was utilized including caps, mask, sterile gowns, sterile gloves, sterile drape, hand hygiene and skin antiseptic. A timeout was performed prior to the initiation of the procedure. The right groin was prepped and draped in the usual sterile fashion. Using a micropuncture kit and the modified Seldinger technique, access was gained to the right common femoral artery and a 5 French sheath was placed. Under fluoroscopy, Berenstein 2 catheter was navigated over a 0.035" Terumo Glidewire into the aortic arch. The catheter was placed into the left subclavian artery . Frontal and lateral angiograms of the neck and skull base were obtained. Attempted catheterization of the cervical left vertebral artery proved unsuccessful due to severe tortuosity. The catheter was then placed into the right common carotid artery. Frontal and lateral angiograms of the neck. Using  biplane roadmap, the catheter was advanced into the right internal carotid artery. Frontal and lateral angiograms of the head were obtained. The catheter was then retracted into the innominate artery. Frontal angiograms of the neck were obtained. Attempted catheterization of the right vertebral artery with multiple catheters proved unsuccessful. The catheter was again placed the left subclavian artery. Frontal angiogram of the head was obtained. The catheter was subsequently placed into the left common carotid artery. Frontal and lateral angiograms of the neck were obtained. Under biplane roadmap, the catheter was advanced into the left internal carotid artery. Frontal and lateral angiograms of the head were obtained. Using the modified Seldinger technique and a micropuncture kit, access was gained to the distal right radial artery at the anatomical snuffbox and a 5 French sheath was placed. Slow intra arterial infusion of 5,000 IU heparin, 5 mg Verapamil and 161 mcg nitroglicerin diluted in patient's own blood was performed. No significant fluctuation in patient's blood pressure seen. Then, a right radial artery roadmap was obtained via sheath side port. Next, a 5 [place] Berenstein 2 catheter was navigated over a 0.035" Terumo Glidewire into the right subclavian artery under fluoroscopic guidance. Frontal and lateral angiograms of the neck were obtained. Under biplane roadmap, the catheter was advanced into the right vertebral artery. Frontal and lateral views of the head were obtained followed by bilateral magnified oblique views. The catheter was subsequently withdrawn. Right common femoral angiograms with right anterior oblique and lateral views were obtained. Then, a 5 [place] Exoseal was utilized for femoral access closure.  Hemostasis obtained after 5 minutes of manual pressure hold. An inflatable band was placed and inflated over the right hand access site. The vascular sheath was withdrawn and the band was  slowly deflated until brisk flow was noted through the arteriotomy site. At this point, the band was reinflated with additional 2 cc of air to obtain patent hemostasis. FINDINGS: Left subclavian angiograms: Atherosclerotic changes and increased tortuosity of the left subclavian artery without hemodynamically significant stenosis. There is severe tortuosity of the V1 segment of the left vertebral artery with mild stenosis at its origin. Opacification of the left vertebral artery up to the V3 segment is noted. Faint opacification of the left PICA seen. Right CCA angiograms: Cervical angiograms show normal course and caliber of the visualized right common carotid and internal carotid arteries. Mild atherosclerotic changes at the right carotid bulb without significant stenosis. Right ICA angiograms: Diffuse luminal irregularity of the intracranial vasculature, consistent with intracranial atherosclerotic disease. Focal stenosis is seen at the petrous of approximately 65-70%. Multifocal areas of mild stenosis are seen in the right ACA vascular tree. There is brisk vascular contrast filling of the ACA and MCA vascular trees. No aneurysms or abnormally high-flow, early draining veins are seen. No regions of abnormal hypervascularity are noted. The visualized dural sinuses are patent. Innominate artery angiogram: Severe tortuosity of the innominate, proximal right subclavian and proximal right common carotid artery without stenosis. Left CCA angiograms: Cervical angiograms show normal course and caliber of the visualized left common carotid and internal carotid arteries. Mild atherosclerotic changes of the left carotid bulb without hemodynamically significant stenosis. Left ICA angiograms: Diffuse luminal irregularity of the intracranial vasculature with multifocal areas of mild stenosis in the cavernous right ICA and left MCA vascular tree. Short segment of severe stenosis is seen at the M3-M4 segment of the left angular  branch. There multiple areas of mild-to-moderate stenosis along the left ACA vascular tree. There is brisk vascular contrast filling of the the ACA and MCA vascular trees. No aneurysms or abnormally high-flow, early draining veins are seen. No regions of abnormal hypervascularity are noted. The visualized dural sinuses are patent. Right subclavian angiograms: Increased tortuosity of the V1 segment of the left vertebral artery without hemodynamically significant stenosis. Right vertebral artery angiograms: Luminal irregularities are seen along the intracranial right vertebral artery, right PICA, basilar artery, bilateral superior cerebellar arteries and posterior cerebral arteries consistent with intracranial atherosclerotic disease. There is moderate stenosis at the origin of the right PICA and mild stenosis at the proximal basilar artery. Faint opacification of the very distal aspect of the intracranial left vertebral artery, at the vertebrobasilar junction, is seen by contrast reflux in appear severely stenotic. No opacification beyond a few mm, consistent with occlusion. No aneurysms or abnormally high-flow, early draining veins are seen. No regions of abnormal hypervascularity are noted. The visualized dural sinuses are patent. Right common femoral artery angiograms: Normal course and caliber of the right external iliac, right common femoral artery and femoral bifurcation. Right radial artery ultrasound and right radial artery angiogram: The caliber of the distal right radial artery is appropriate for angiogram access. The right radial artery and the right ulnar artery have normal course and caliber. No significant anatomical variants noted. PROCEDURE: No intervention performed. IMPRESSION: 1. 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  2. No hemodynamically significant stenosis at the neck. PLAN: Continue follow-up  with neurology. In case the right ICA stenosis becomes symptomatic and refractory to medical treatment, intracranial angioplasty and stenting can be performed. Electronically Signed   By: Pedro Earls M.D.   On: 10/21/2020 16:50   IR ANGIO VERTEBRAL SEL VERTEBRAL UNI R MOD SED  Result Date: 10/21/2020 INDICATION: Mr. Dennis Dearmas Sr. is a 65 year old male with history of hypertension, hyperlipidemia, TIA 02/2020 admitted for transient left temporal headache, vertigo, swallowing difficulty, hoarseness, left-sided numbness and discoordination after 2 sneezes. No tPA given due to mild symptoms. 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 He comes to our service today for a diagnostic cerebral angiogram to evaluate intracranial vasculature. EXAM: ULTRASOUND-GUIDED VASCULAR ACCESS DIAGNOSTIC CEREBRAL ANGIOGRAM COMPARISON:  CT angiogram of the head and neck Oct 17, 2020 MEDICATIONS: No antibiotics. ANESTHESIA/SEDATION: Versed 1.5 mg IV; Fentanyl 50 mcg IV; 5 mg hydralazine Moderate Sedation Time:  88 minutes The patient was continuously monitored during the procedure by the interventional radiology nurse under my direct supervision. CONTRAST:  110 mL of Omnipaque 240 milligram/mL FLUOROSCOPY TIME:  Fluoroscopy Time: 30 minutes 24 seconds (1,400 mGy). COMPLICATIONS: None immediate. TECHNIQUE: Informed written consent was obtained from the patient after a thorough discussion of the procedural risks, benefits and alternatives. All questions were addressed. Maximal Sterile Barrier Technique was utilized including caps, mask, sterile gowns, sterile gloves, sterile drape, hand hygiene and skin antiseptic. A timeout was performed prior to the initiation of the procedure. The right groin was prepped and draped in  the usual sterile fashion. Using a micropuncture kit and the modified Seldinger technique, access was gained to the right common femoral artery and a 5 French sheath was placed. Under fluoroscopy, Berenstein 2 catheter was navigated over a 0.035" Terumo Glidewire into the aortic arch. The catheter was placed into the left subclavian artery . Frontal and lateral angiograms of the neck and skull base were obtained. Attempted catheterization of the cervical left vertebral artery proved unsuccessful due to severe tortuosity. The catheter was then placed into the right common carotid artery. Frontal and lateral angiograms of the neck. Using biplane roadmap, the catheter was advanced into the right internal carotid artery. Frontal and lateral angiograms of the head were obtained. The catheter was then retracted into the innominate artery. Frontal angiograms of the neck were obtained. Attempted catheterization of the right vertebral artery with multiple catheters proved unsuccessful. The catheter was again placed the left subclavian artery. Frontal angiogram of the head was obtained. The catheter was subsequently placed into the left common carotid artery. Frontal and lateral angiograms of the neck were obtained. Under biplane roadmap, the catheter was advanced into the left internal carotid artery. Frontal and lateral angiograms of the head were obtained. Using the modified Seldinger technique and a micropuncture kit, access was gained to the distal right radial artery at the anatomical snuffbox and a 5 French sheath was placed. Slow intra arterial infusion of 5,000 IU heparin, 5 mg Verapamil and 937 mcg nitroglicerin diluted in patient's own blood was performed. No significant fluctuation in patient's blood pressure seen. Then, a right radial artery roadmap was obtained via sheath side port. Next, a 5 [place] Berenstein 2 catheter was navigated over a 0.035" Terumo Glidewire into the right subclavian artery under  fluoroscopic guidance. Frontal and lateral angiograms of the neck were obtained. Under biplane roadmap, the catheter was advanced into the right vertebral artery.  Frontal and lateral views of the head were obtained followed by bilateral magnified oblique views. The catheter was subsequently withdrawn. Right common femoral angiograms with right anterior oblique and lateral views were obtained. Then, a 5 [place] Exoseal was utilized for femoral access closure. Hemostasis obtained after 5 minutes of manual pressure hold. An inflatable band was placed and inflated over the right hand access site. The vascular sheath was withdrawn and the band was slowly deflated until brisk flow was noted through the arteriotomy site. At this point, the band was reinflated with additional 2 cc of air to obtain patent hemostasis. FINDINGS: Left subclavian angiograms: Atherosclerotic changes and increased tortuosity of the left subclavian artery without hemodynamically significant stenosis. There is severe tortuosity of the V1 segment of the left vertebral artery with mild stenosis at its origin. Opacification of the left vertebral artery up to the V3 segment is noted. Faint opacification of the left PICA seen. Right CCA angiograms: Cervical angiograms show normal course and caliber of the visualized right common carotid and internal carotid arteries. Mild atherosclerotic changes at the right carotid bulb without significant stenosis. Right ICA angiograms: Diffuse luminal irregularity of the intracranial vasculature, consistent with intracranial atherosclerotic disease. Focal stenosis is seen at the petrous of approximately 65-70%. Multifocal areas of mild stenosis are seen in the right ACA vascular tree. There is brisk vascular contrast filling of the ACA and MCA vascular trees. No aneurysms or abnormally high-flow, early draining veins are seen. No regions of abnormal hypervascularity are noted. The visualized dural sinuses are patent.  Innominate artery angiogram: Severe tortuosity of the innominate, proximal right subclavian and proximal right common carotid artery without stenosis. Left CCA angiograms: Cervical angiograms show normal course and caliber of the visualized left common carotid and internal carotid arteries. Mild atherosclerotic changes of the left carotid bulb without hemodynamically significant stenosis. Left ICA angiograms: Diffuse luminal irregularity of the intracranial vasculature with multifocal areas of mild stenosis in the cavernous right ICA and left MCA vascular tree. Short segment of severe stenosis is seen at the M3-M4 segment of the left angular branch. There multiple areas of mild-to-moderate stenosis along the left ACA vascular tree. There is brisk vascular contrast filling of the the ACA and MCA vascular trees. No aneurysms or abnormally high-flow, early draining veins are seen. No regions of abnormal hypervascularity are noted. The visualized dural sinuses are patent. Right subclavian angiograms: Increased tortuosity of the V1 segment of the left vertebral artery without hemodynamically significant stenosis. Right vertebral artery angiograms: Luminal irregularities are seen along the intracranial right vertebral artery, right PICA, basilar artery, bilateral superior cerebellar arteries and posterior cerebral arteries consistent with intracranial atherosclerotic disease. There is moderate stenosis at the origin of the right PICA and mild stenosis at the proximal basilar artery. Faint opacification of the very distal aspect of the intracranial left vertebral artery, at the vertebrobasilar junction, is seen by contrast reflux in appear severely stenotic. No opacification beyond a few mm, consistent with occlusion. No aneurysms or abnormally high-flow, early draining veins are seen. No regions of abnormal hypervascularity are noted. The visualized dural sinuses are patent. Right common femoral artery angiograms: Normal  course and caliber of the right external iliac, right common femoral artery and femoral bifurcation. Right radial artery ultrasound and right radial artery angiogram: The caliber of the distal right radial artery is appropriate for angiogram access. The right radial artery and the right ulnar artery have normal course and caliber. No significant anatomical variants noted. PROCEDURE: No intervention  performed. IMPRESSION: 1. 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 2. No hemodynamically significant stenosis at the neck. PLAN: Continue follow-up with neurology. In case the right ICA stenosis becomes symptomatic and refractory to medical treatment, intracranial angioplasty and stenting can be performed. Electronically Signed   By: Pedro Earls M.D.   On: 10/21/2020 16:50    Labs:  Basic Metabolic Panel: No results for input(s): NA, K, CL, CO2, GLUCOSE, BUN, CREATININE, CALCIUM, MG, PHOS in the last 168 hours.  CBC: No results for input(s): WBC, NEUTROABS, HGB, HCT, MCV, PLT in the last 168 hours.  CBG: Recent Labs  Lab 10/29/20 2109 10/30/20 0605 10/30/20 1128 10/30/20 1628 10/30/20 2058  GLUCAP 76 83 92 110* 92   Family history.  Mother with hypertension.  Sister with CVA and lupus.  Brother with CVA.  Denies any colon cancer esophageal cancer or rectal cancer  Brief HPI:   Dennis Campillo Sr. is a 65 y.o. right-handed male with history of BPH, hyperlipidemia, hypertension, prediabetes, presumed TIA September 2021 maintained on aspirin.  Per chart review lives with spouse daughter and grandson.  Independent prior to admission.  Patient still works full-time.  Presented 10/17/2020 with left-sided weakness incoordination and headache of acute onset.  Noted systolic blood pressure 941D to 190s.  CT/MRI showed  no acute intracranial abnormality.  Age-related cerebral atrophy and moderate chronic small vessel ischemic disease with superimposed remote lacunar infarct of the right thalamus.  CT angiogram head and neck negative CTA for emergent large vessel occlusion.  Focal stenosis involving the horizontal petrous right ICA.  Multifocal severe left P2 stenosis.  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  Echocardiogram with ejection fraction of 60 to 65% no wall motion abnormalities grade 1 diastolic dysfunction.  Patient did not receive tPA.  Follow-up MRI showed new focus of abnormal diffusion restriction within the left dorsal medulla oblongata consistent with acute to subacute infarction.  Admission chemistries unremarkable except glucose 105 BUN 24 hemoglobin A1c 5.8.  Maintained on aspirin 325 mg daily and Plavix 75 mg daily for  CVA prophylaxis x3 months.  Subcutaneous Lovenox for DVT prophylaxis.  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  Intracranial atherosclerotic disease.  Therapy evaluations completed due to patient's left side weakness and numbness was admitted for a comprehensive rehab program   Hospital Course: Dennis Koral Sr. was admitted to rehab 10/21/2020 for inpatient therapies to consist of PT, ST and OT at least three hours five days a week. Past admission physiatrist, therapy team and rehab RN have worked together to provide customized collaborative inpatient rehab.  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  He had been on Lovenox for DVT prophylaxis discontinued ambulating greater than 700 feet.  Crestor ongoing for hyperlipidemia.  Prediabetes hemoglobin A1c 5.8 diet teaching completed patient would follow-up outpatient and currently maintained on  Glucophage 500 mg twice daily.  Proscar/Flomax ongoing for BPH voiding without difficulty.  Blood pressure controlled with lisinopril follow-up outpatient.  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  Obesity BMI 34.55 dietary follow-up.   Blood pressures were monitored on TID basis and controlled  Diabetes has been monitored with ac/hs CBG checks and SSI was use prn for tighter BS control.    Rehab course: During patient's stay in rehab weekly team conferences were held to monitor patient's progress, set goals and discuss barriers to discharge. At admission, patient required min assist supine to sit minimal assist sit to supine minimal assist sit to stand modified independent eating moderate assist upper body bathing max assist lower body bathing max assist lower body dressing total assist toilet transfers  Physical exam.  Blood pressure 140/86 pulse 77 temperature 98.6 respirations 22 oxygen saturation 97% room air Constitutional.  No acute distress HEENT Head.  Normocephalic and atraumatic Eyes.  Pupils round and reactive to light no discharge.nystagmus Neck.  Supple nontender no JVD without thyromegaly Cardiac regular rate and rhythm without extra sounds or murmur heard Abdomen.  Soft nontender positive bowel sounds without rebound Respiratory effort normal no respiratory distress without wheeze Musculoskeletal.  No swelling normal range of motion no edema Skin.  Warm and dry Neurologic.  Alert oriented x3.  Normal insight and awareness.  Intact memory.  Normal language and speech.  Speech did have some dysphonia.  Left hemifacial sensory loss.  Decreased depth perception with FTN, HTS.  Strength nearly 5/5 in upper lower extremities bilaterally.  Sensed pain in all 4 extremities.  He/She  has had improvement in activity tolerance, balance, postural  control as well as ability to compensate for deficits. He/She has had improvement in functional use RUE/LUE  and RLE/LLE as well as improvement in awareness.  Sessions focused on functional ability transfers generalized strengthening ambulating extended distances as well as transfers supine to sit supervision.  In the rehab apartment transferred sit to stand rolling walker minimal assist.  Patient with mild left lateral lean requiring some cues.  Patient transfer onto a regular toilet contact-guard assist use of grab bars able to manage clothing and void modified independent.  Instructed in proper rolling walker placement to get in and out of shower.  It was advised no driving.  Full family teaching completed plan discharged to home       Disposition: Discharged to home    Diet: Carb modified  Special Instructions: No driving smoking or alcohol  Plan is for aspirin 325 mg daily and Plavix 75 mg daily x3 months total then Plavix alone  Medications at discharge 1.  Tylenol as needed 2.  Aspirin 325 mg p.o. daily 3.  Vitamin D 1000 units p.o. daily 4.  Plavix 75 mg p.o. daily 5.  Proscar 5 mg p.o. daily 6.  Lisinopril 20 mg p.o. daily 7.  Glucophage 500 mg p.o. twice daily 8.  Multivitamin daily 9.  Crestor 40 mg p.o. nightly 10.  Flomax 0.8 mg p.o. 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   Specialty: Physical Medicine and Rehabilitation Why: 11/28/20 please arrive in  office at 1:00pm for  1:20pm appointment, thank you! Contact information: [address] [place] [address] [phone]               Signed: Cathlyn Parsons 10/31/2020, 5:20 AM

## 2020-10-29 NOTE — Progress Notes (Signed)
Patient ID: Dennis Belluomini Sr., male   DOB: 1955/11/19, 65 y.o.   MRN: [id number]  Spoke with wife via telephone to discuss family education can do on Friday @ 1:00-3:00 pm. She expressed concerns of her ability to care for him at discharge. She has spoken with MD, and therapist and both have made her feel better. Plan now is wife coming in Friday for education and see if can manage his care and if comfortable will discharge home on Friday. If does not feel comfortable then will re-eval discharge date. Wife has gotten equipment from a friend so no equipment needs.

## 2020-10-29 NOTE — Progress Notes (Signed)
PROGRESS NOTE   Subjective/Complaints: Discussed his progress with wife today and addressed her concerns. Discussed with team focusing on stairs this week since he will have 5 stairs to navigate. Discussed ways to prevent future stroke  ROS: +diplopia, +facial numbness, +nausea   Objective:   No results found. No results for input(s): WBC, HGB, HCT, PLT in the last 72 hours. No results for input(s): NA, K, CL, CO2, GLUCOSE, BUN, CREATININE, CALCIUM in the last 72 hours.  Intake/Output Summary (Last 24 hours) at 10/29/2020 1136 Last data filed at 10/29/2020 1055 Gross per 24 hour  Intake 401 ml  Output 2350 ml  Net -1949 ml        Physical Exam: Vital Signs Blood pressure 120/83, pulse 69, temperature 98.2 F (36.8 C), temperature source Oral, resp. rate 14, height 6\' 1"  (1.854 m), weight 118.8 kg, SpO2 95 %. Gen: no distress, normal appearing HEENT: oral mucosa pink and moist, NCAT Cardio: Reg rate Chest: normal effort, normal rate of breathing Abd: soft, non-distended Ext: no edema Psych: pleasant, normal affect Skin: intact Musculoskeletal:  General: No swelling.  Cervical back: Normal range of motion.  Right lower leg: No edema.  Left lower leg: No edema.  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  Coloration: Skin is not jaundicedor pale.  Neurological:  Comments: Alert and oriented x 3. Normal insight and awareness. Intact Memory. Normal language and speech. Diplopia, dysphonia. Left hemi-facial sensory loss. Decreased depth perception with FTN, HTS. Strength nearly 5/5 in UE and LE bilaterally. Sensed pain and light touch in all 4 limbs.Strong left lateral lean Psychiatric:  Mood and Affect: Moodnormal.  Behavior: Behaviornormal.   Assessment/Plan: 1. Functional deficits which require 3+ hours per day of interdisciplinary  therapy in a comprehensive inpatient rehab setting.  Physiatrist is providing close team supervision and 24 hour management of active medical problems listed below.  Physiatrist and rehab team continue to assess barriers to discharge/monitor patient progress toward functional and medical goals  Care Tool:  Bathing    Body parts bathed by patient: Right arm,Left arm,Chest,Abdomen,Front perineal area,Buttocks,Right upper leg,Left upper leg,Right lower leg,Left lower leg,Face         Bathing assist Assist Level: Set up assist     Upper Body Dressing/Undressing Upper body dressing   What is the patient wearing?: Pull over shirt    Upper body assist Assist Level: Set up assist    Lower Body Dressing/Undressing Lower body dressing      What is the patient wearing?: Pants,Underwear/pull up     Lower body assist Assist for lower body dressing: Set up assist     Toileting Toileting    Toileting assist Assist for toileting: Minimal Assistance - Patient > 75% (Used urinal)     Transfers Chair/bed transfer  Transfers assist     Chair/bed transfer assist level: Contact Guard/Touching assist     Locomotion Ambulation   Ambulation assist      Assist level: Moderate Assistance - Patient 50 - 74% Assistive device: Walker-rolling Max distance: 46ft   Walk 10 feet activity   Assist     Assist level: Moderate Assistance -  Patient - 50 - 74% Assistive device: Walker-rolling   Walk 50 feet activity   Assist Walk 50 feet with 2 turns activity did not occur: Safety/medical concerns (L lean, decreased balance, visual impairments)  Assist level: Moderate Assistance - Patient - 50 - 74% Assistive device: Walker-rolling    Walk 150 feet activity   Assist Walk 150 feet activity did not occur: Safety/medical concerns (L lean, decreased balance, visual impairments)  Assist level: 2 helpers Assistive device: Lite Gait    Walk 10 feet on uneven surface   activity   Assist Walk 10 feet on uneven surfaces activity did not occur: Safety/medical concerns (L lean, decreased balance, visual impairments)         Wheelchair     Assist Will patient use wheelchair at discharge?: Yes Type of Wheelchair: Manual    Wheelchair assist level: Supervision/Verbal cueing Max wheelchair distance: >134ft    Wheelchair 50 feet with 2 turns activity    Assist        Assist Level: Supervision/Verbal cueing   Wheelchair 150 feet activity     Assist      Assist Level: Supervision/Verbal cueing   Blood pressure 120/83, pulse 69, temperature 98.2 F (36.8 C), temperature source Oral, resp. rate 14, height 6\' 1"  (1.854 m), weight 118.8 kg, SpO2 95 %.    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 Impaired mobility: d/c Lovenox since ambulating >700 feet. Discussed with patient.  -antiplatelet therapy: continue Aspirin 325 mg daily and Plavix 75 mg dailyx3 months then Plavix alone (from 5/6) 3. Pain Management:Denies d/c Neurontin 300 mg daily. Discussed with patient.  4. Mood:Provide emotional support -antipsychotic agents: N/A 5. Neuropsych: This patientiscapable of making decisions on hisown behalf. 6. Skin/Wound Care:Routine skin checks 7. Fluids/Electrolytes/Nutrition:pt has good appetite, d/c ensure supplements -electrolytes stable, no need to recheck.  8. Hyperlipidemia. Continue Crestor 9. Prediabetes. Hemoglobin A1c 5.8. SSI. Advised no added sugar/bread/pasta/rice and high intake of vegetable/nuts/olive oil in diet. Reviewed CBGs with him.  10. BPH. ContinueProscar 5 mg daily -monitor voiding patterns 11. Nausea: zofran prescribed 12. Diplopia:  continue tape over left eyeglass lens, helping 13.  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 Obesity BMI 34.55: provided dietary education 15. Disposition: discussed current performance with wife and stroke prevention strategies  LOS: 8 days A FACE TO FACE EVALUATION WAS PERFORMED  P Erleen Egner 10/29/2020, 11:36 AM

## 2020-10-29 NOTE — Patient Care Conference (Signed)
Inpatient RehabilitationTeam Conference and Plan of Care Update Date: 10/28/2020   Time: 13:08 PM    Patient Name: Dennis Ohanesian Sr.      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 Sula Soda Care Coodinator Present: Chana Bode, RN, BSN, CRRN;Becky Dupree, LCSW Nurse Present: Chana Bode, RN PT Present: Raechel Chute, PT OT Present: Rosalio Loud, OT SLP Present: Colin Benton, SLP PPS Coordinator present : Fae Pippin, SLP     Current Status/Progress Goal Weekly Team Focus  Bowel/Bladder   Patient is continent of gladdwe/Bowel, LBM 10/26/2020  Maintain regular B/B pattern  QS Assess B/B   Swallow/Nutrition/ Hydration             ADL's   Min A - CGA squat pivot and stand pivot transfers, Setup bathing and dressing  Supervision overall  ADL retraining, bathroom transfers, trunk control due to L lean, pt/family education   Mobility   bed mobility supervision, stand<>pivot and squat<>pivot transfers min/mod A, gait >66ft with +2 assist, WC mobility 169ft supervision  supervision, CGA gait and stairs, mod I WC mobility  functional mobility/transfers, generalized strengthening, dynamic standing balance/coordination, D/C planning, and improved activity tolerance.   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 Remains little impulsive and requires cues for pacing of activities. Patient on target to meet rehab goals: yes, currently bed mobility with supervision, completes squat pivot transfers with CGA. Able to ambulate up to 26' with mod assist and manage 1 step mod assist. Set up for bathing and dressing and showering. Goals for discharge set at supervision overall with CGA for steps and gait.  *See Care Plan and progress notes for long and short-term goals.   Revisions to Treatment Plan:   Teaching Needs: Steps, gait, cues for pacing, medications, secondary stroke risk management, etc.  Current Barriers to Discharge: Decreased caregiver support and Home enviroment access/layout  Possible Resolutions to Barriers: Family education with wife BSC recommended for discharge     Medical Summary Current Status: diplopia, nausea, facial numbness, polypharmacy, impulsive, elevated CBGs  Barriers to Discharge: Medical stability  Barriers to Discharge Comments: diplopia, nausea, facial numbness, polypharmacy, impulsive, elevated CBGs Possible Resolutions to 66 Focus: provided education regarding the type of stroke he has, reviewed medications with patient and adjust as needed, education regarding impusivity, provided dietary education, continue metformin, continue to monitor CBGs AC/HS   Continued Need for Acute Rehabilitation Level of Care: The  patient requires daily medical management by a physician with specialized training in physical medicine and rehabilitation for the following reasons: Direction of a multidisciplinary physical rehabilitation program to maximize functional  independence : Yes Medical management of patient stability for increased activity during participation in an intensive rehabilitation regime.: Yes Analysis of laboratory values and/or radiology reports with any subsequent need for medication adjustment and/or medical intervention. : Yes   I attest that I was present, lead the team conference, and concur with the assessment and plan of the team.   Chana Bode B 10/29/2020, 8:13 AM

## 2020-10-29 NOTE — Discharge Instructions (Signed)
Inpatient Rehab Discharge Instructions  Dennis Fridman Sr. Discharge date and time: No discharge date for patient encounter.   Activities/Precautions/ Functional Status: Activity: activity as tolerated Diet: regular diet Wound Care: Routine skin checks Functional status:  ___ No restrictions     ___ Walk up steps independently ___ 24/7 supervision/assistance   ___ Walk up steps with assistance ___ Intermittent supervision/assistance  ___ Bathe/dress independently ___ Walk with walker     _x__ Bathe/dress with assistance ___ Walk Independently    ___ Shower independently ___ Walk with assistance    ___ Shower with assistance ___ No alcohol     ___ Return to work/school ________  Special Instructions: No driving smoking or alcohol   Continue aspirin 325 mg daily and Plavix 75 mg day x3 months total then Plavix    COMMUNITY REFERRALS UPON DISCHARGE:    Outpatient: PT & OT             Agency:[facility] Phone:[phone]              Appointment Date/Time:WILL CONTACT WIFE TO SET UP APPOINTMENTS  Medical Equipment/Items Ordered: HAS OBTAINED ALL EQUIPMENT NEEDED FOR DISCHARGE                                                 Agency/Supplier: NA   STROKE/TIA DISCHARGE INSTRUCTIONS SMOKING Cigarette smoking nearly doubles your risk of having a stroke & is the single most alterable risk factor  If you smoke or have smoked in the last 12 months, you are advised to quit smoking for your health.  Most of the excess cardiovascular risk related to smoking disappears within a year of stopping.  Ask you doctor about anti-smoking medications  [place] Quit Line: 1-800-QUIT NOW  Free Smoking Cessation Classes [phone]  CHOLESTEROL Know your levels; limit fat & cholesterol in your diet  Lipid Panel     Component Value Date/Time   CHOL 156 10/17/2020 1017   TRIG 189 (H) 10/17/2020 1017   HDL 38 (L) 10/17/2020 1017   CHOLHDL 4.1 10/17/2020 1017   VLDL  38 10/17/2020 1017   LDLCALC 80 10/17/2020 1017      Many patients benefit from treatment even if their cholesterol is at goal.  Goal: Total Cholesterol (CHOL) less than 160  Goal:  Triglycerides (TRIG) less than 150  Goal:  HDL greater than 40  Goal:  LDL (LDLCALC) less than 100   BLOOD PRESSURE American Stroke Association blood pressure target is less that 120/80 mm/Hg  Your discharge blood pressure is:  BP: (!) 136/94  Monitor your blood pressure  Limit your salt and alcohol intake  Many individuals will require more than one medication for high blood pressure  DIABETES (A1c is a blood sugar average for last 3 months) Goal HGBA1c is under 7% (HBGA1c is blood sugar average for last 3 months)  Diabetes: No known diagnosis of diabetes    Lab Results  Component Value Date   HGBA1C 5.8 (H) 10/17/2020     Your HGBA1c can be lowered with medications, healthy diet, and exercise.  Check your blood sugar as directed by your physician  Call your physician if you experience unexplained or low blood sugars.  PHYSICAL ACTIVITY/REHABILITATION Goal is 30 minutes at least 4 days per week  Activity: Increase activity slowly, Therapies: Physical Therapy:  Home Health Return to work:   Activity decreases your risk of heart attack and stroke and makes your heart stronger.  It helps control your weight and blood pressure; helps you relax and can improve your mood.  Participate in a regular exercise program.  Talk with your doctor about the best form of exercise for you (dancing, walking, swimming, cycling).  DIET/WEIGHT Goal is to maintain a healthy weight  Your discharge diet is:  Diet Order            Diet heart healthy/carb modified Room service appropriate? Yes; Fluid consistency: Thin  Diet effective now                 liquids Your height is:    Your current weight is:   Your Body Mass Index (BMI) is:     Following the type of diet specifically designed for you will help  prevent another stroke.  Your goal weight range is:    Your goal Body Mass Index (BMI) is 19-24.  Healthy food habits can help reduce 3 risk factors for stroke:  High cholesterol, hypertension, and excess weight.  RESOURCES Stroke/Support Group:  Call [phone]   STROKE EDUCATION PROVIDED/REVIEWED AND GIVEN TO PATIENT Stroke warning signs and symptoms How to activate emergency medical system (call 911). Medications prescribed at discharge. Need for follow-up after discharge. Personal risk factors for stroke. Pneumonia vaccine given:  Flu vaccine given:  My questions have been answered, the writing is legible, and I understand these instructions.  I will adhere to these goals & educational materials that have been provided to me after my discharge from the hospital.    alone   My questions have been answered and I understand these instructions. I will adhere to these goals and the provided educational materials after my discharge from the hospital.  Patient/Caregiver Signature _______________________________ Date __________  Clinician Signature _______________________________________ Date __________  Please bring this form and your medication list with you to all your follow-up doctor's appointments.

## 2020-10-29 NOTE — Progress Notes (Signed)
Physical Therapy Session Note  Patient Details  Name: Dennis Rigel Sr. MRN: [id number] Date of Birth: 10/15/1955  Today's Date: 10/29/2020 PT Individual Time: 1100-1146 and 7619-5093  PT Individual Time Calculation (min): 46 min  and 24 min Today's Date: 10/29/2020 PT Missed Time: 14 Minutes Missed Time Reason: Patient ill (Comment) (nausea)  Short Term Goals: Week 1:  PT Short Term Goal 1 (Week 1): pt will transfer bed<>chair with LRAD and min A PT Short Term Goal 2 (Week 1): pt will ambulate 14ft with LRAD and mod A of 1 PT Short Term Goal 3 (Week 1): Pt will initate stair navigation  Skilled Therapeutic Interventions/Progress Updates:   Treatment Session 1 Received pt supine in bed, pt agreeable to PT session, and reported nausea but denied any pain during session. Pt reported receiving nausea medication prior to session. Session with emphasis on functional mobility/transfers, generalized strengthening, dynamic standing balance/coordination, gait training, and improved activity tolerance. Pt performed bed mobility mod I x 2 trials throughout session and x 4 squat<>pivot transfers with CGA throughout session. Pt able to set up all transfers and manage WC parts with supervision. Pt transported to 4W dayroom in [facility] total A for time management purposes. Sit<>stand with min A and donned LiteGait harness standing with max A and pt stepped on/off Treadmill with BUE support and min A. Worked on Investment banker, operational on LiteGait Treadmill for the following time frames using mirror for visual feedback with BUE support: - Forward stepping for 264ft at 1. for 2 minutes on no incline increasing to incline 2 for additional 45 seconds. Pt demonstrated improvements in ability to correct L lean with less cues overall but continues to require heavy reliance on UE support. -Lateral stepping to L for 83ft for 3 minutes and 3 seconds at 0. with emphasis on hip abductor strengthening. Pt with increased difficulty  sequencing technique and required cues for upright posture/gaze as pt with tendency to look down at feet.  Pt severely limited during session due to nausea and requested to avoid any further standing activities. On Nustep, pt performed BUE/LE strengthening on Nustep at workload 6 for 5 minutes for a total of 332 steps for improved cardiovascular endurance prior to stating "I'm sorry, I can't do anymore". Pt able to maintain midline position independently and without cues. Pt transported back to room in [facility] total A and requested to return to bed. Concluded session with pt sitting EOB with all needs within reach. 14 minutes missed of skilled physical therapy due to nausea.   Treatment Session 2 Received pt sitting EOB, pt agreeable to therapy, and denied any pain during session and reported nausea improved. Session with emphasis on functional mobility/transfers, generalized strengthening, dynamic standing balance/coordination, gait training, and improved activity tolerance. Pt performed squat<>pivot with CGA x 2 trials throughout session and able to set up transfers (including WC parts management) with supervision. Pt ambulated 123ft x 1 and 143ft x 1 with RW and CGA fading to min/occasional mod A with increased fatigue. Pt demonstrated improvement with L lateral lean and ability to correct with minimal cues. Encouraged pt to standing and reset with increased onset of LOB due to fatigue. Pt performed the following exercises seated in [facility] with supervision and verbal cues for technique: -WC pushups 2x15 -hip abduction isometric hold against gait belt 10x5 second hold -hip flexion isometric hold against gait belt 8x5 seconds hold bilaterally Concluded session with pt sitting EOB with all needs within reach.  Therapy Documentation Precautions:  Precautions Precautions:  Fall,Other (comment) Precaution Comments: Diplopia, L lean Restrictions Weight Bearing Restrictions: No  Therapy/Group: Individual  Therapy Martin Majestic PT, DPT   10/29/2020, 7:30 AM

## 2020-10-30 LAB — GLUCOSE, CAPILLARY
Glucose-Capillary: 83 mg/dL (ref 70–99)
Glucose-Capillary: 92 mg/dL (ref 70–99)
Glucose-Capillary: 110 mg/dL — ABNORMAL HIGH (ref 70–99)
Glucose-Capillary: 92 mg/dL (ref 70–99)

## 2020-10-30 MED ORDER — VITAMIN C 500 MG PO TABS: 500.0000 mg | ORAL_TABLET | Freq: Every day | ORAL | 0 refills | Status: AC

## 2020-10-30 MED ORDER — CLOPIDOGREL BISULFATE 75 MG PO TABS: 75.0000 mg | ORAL_TABLET | Freq: Every day | ORAL | 0 refills | Status: DC

## 2020-10-30 MED ORDER — FINASTERIDE 5 MG PO TABS: 5.0000 mg | ORAL_TABLET | Freq: Every day | ORAL | 0 refills | Status: AC

## 2020-10-30 MED ORDER — CALCIUM CARB-CHOLECALCIFEROL 500-400 MG-UNIT PO TABS: 1.0000 | ORAL_TABLET | Freq: Every day | ORAL | 0 refills | Status: AC

## 2020-10-30 MED ORDER — ROSUVASTATIN CALCIUM 40 MG PO TABS: 40.0000 mg | ORAL_TABLET | Freq: Every day | ORAL | 0 refills | Status: DC

## 2020-10-30 MED ORDER — TAMSULOSIN HCL 0.4 MG PO CAPS: 0.8000 mg | ORAL_CAPSULE | Freq: Every day | ORAL | 0 refills | Status: AC

## 2020-10-30 MED ORDER — METFORMIN HCL 500 MG PO TABS: 500.0000 mg | ORAL_TABLET | Freq: Two times a day (BID) | ORAL | 0 refills | Status: AC

## 2020-10-30 MED ORDER — ACETAMINOPHEN 325 MG PO TABS: 650.0000 mg | ORAL_TABLET | ORAL | Status: AC | PRN

## 2020-10-30 MED ORDER — GABAPENTIN 300 MG PO CAPS: 300.0000 mg | ORAL_CAPSULE | Freq: Every day | ORAL | 0 refills | Status: AC

## 2020-10-30 MED ORDER — LISINOPRIL 20 MG PO TABS: 20.0000 mg | ORAL_TABLET | Freq: Every day | ORAL | 0 refills | Status: DC

## 2020-10-30 MED ORDER — VITAMIN D3 25 MCG (1000 UNIT) PO TABS: 1000.0000 [IU] | ORAL_TABLET | Freq: Every day | ORAL | 0 refills | Status: AC

## 2020-10-30 NOTE — Progress Notes (Signed)
Inpatient Rehabilitation Care Coordinator Discharge Note  The overall goal for the admission was met for:   Discharge location: Yes-HOME WITH WIFE WHO IS AWARE OF HIS NEED FOR 24/7 CARE  Length of Stay: Yes-10 DAYS  Discharge activity level: Yes-CGA-MIN LEVEL  Home/community participation: Yes  Services provided included: MD, RD, PT, OT, SLP, RN, CM, Pharmacy, Neuropsych and SW  Financial Services: Private Insurance: BLUE MEDICARE  Choices offered to/list presented to:PT AND WIFE  Follow-up services arranged: Outpatient: [facility] IN [place]-PT & OT WILL CALL WIFE TO SCHEDULE APPOINTMENTS NO DME NEEDS BORROWED OR HAVE FROM PAST SURGERY  Comments (or additional information):WIFE WAS IN FOR FAMILY EDUCATION AND IS AWARE OF HIS NEED FOR 24/7 CARE AT [place]  Patient/Family verbalized understanding of follow-up arrangements: Yes  Individual responsible for coordination of the follow-up plan: [name]-WIFE [phone]  Confirmed correct DME delivered: Elease Hashimoto 10/30/2020    Elease Hashimoto

## 2020-10-30 NOTE — Progress Notes (Signed)
Occupational Therapy Session Note  Patient Details  Name: Dennis Tierno Sr. MRN: [id number] Date of Birth: 28-Apr-1956  Today's Date: 10/30/2020 OT Individual Time: 1445-1530 OT Individual Time Calculation (min): 45 min    Short Term Goals: Week 1:  OT Short Term Goal 1 (Week 1): Patient will complete toileting with Min A. OT Short Term Goal 2 (Week 1): Patient will complete LB dressing/bathing min A with AE PRN. OT Short Term Goal 3 (Week 1): Patient will complete dynamic sitting balance ADL with no more than min verbal cues to maintain midline posture. OT Short Term Goal 4 (Week 1): Patient will complete stand pivot transfers min A consistently.  Skilled Therapeutic Interventions/Progress Updates:    Pt received supine in bed, agreeable to OT session, and slight dizziness reported. Sup>sit mod I using bed rails. Sit>stand close spvsn to RW. Pt ambulated bed>shower and completed walk in shower transfer close spvsn. UB/LB dressing and bathing set up A. Pt completed grooming tasks seated in w/c at sink (oral hygiene, washing face) independently. Pt reporting improved diplopia and increased L facial sensation. Pt engaged in oculomotor activities (simulated Marsden ball OS/OD/OU and accommodation activities) to further improve diplopia and convergence of bilateral eye muscles; noted lagging/fatigue of OS in pursuits with simulated Marsden ball with OD occluded. Pt reported slight increase in nausea intermittently during pursuits with OD. Pt education/recommendations for f/u with optometrist if diplopia and vertigo symptoms persist. Pt left sitting EOB, alarm set, call bell in reach, and all needs met.  Therapy Documentation Precautions:  Precautions Precautions: Fall,Other (comment) Precaution Comments: mild L lean Restrictions Weight Bearing Restrictions: No   Therapy/Group: Individual Therapy  Carlos Levering 10/30/2020, 7:41 AM

## 2020-10-30 NOTE — Progress Notes (Signed)
Physical Therapy Session Note  Patient Details  Name: Dennis Serano Sr. MRN: [id number] Date of Birth: Oct 19, 1955  Today's Date: 10/30/2020 PT Individual Time: 1300-1408 PT Individual Time Calculation (min): 68 min   Short Term Goals: Week 1:  PT Short Term Goal 1 (Week 1): pt will transfer bed<>chair with LRAD and min A PT Short Term Goal 2 (Week 1): pt will ambulate 57ft with LRAD and mod A of 1 PT Short Term Goal 3 (Week 1): Pt will initate stair navigation  Skilled Therapeutic Interventions/Progress Updates:   Received pt supine in bed, pt agreeable to therapy, and denied any pain during session but reported feeling dizzy. Session with emphasis on discharge planning, functional mobility/transfers, generalized strengthening, dynamic standing balance/coordination, gait training, stair navigation, simulated car transfers, and improved activity tolerance. Pt performed bed mobility mod I x 2 trials throughout session. Pt able to perform all squat<>pivot and most stand<>pivot transfers with supervision as well as set up transfers with supervision/mod I. Pt performed WC mobility 169ft x 1 and 17ft x 1 using BUE and supervision to 4W therapy gym. Pt navigated 12 steps with L handrail and CGA using a lateral stepping technique with cues to decrease cadence and ensure foot completly on step prior to proceeding for safety. Pt then reported urge to urinate and was transported to closest bathroom in [facility] total A. Pt able to set up toilet transfer and transition over with supervision. Pt able to void, perform peri-care, and clothing management without assist. Pt then navigated 1 6in curb with RW and CGA without cues for technique. Upon walking back to WC, pt with 1 L lateral LOB requiring min A to correct due to increased "dizziness". Pt then ambulated 147ft with RW and CGA. Discussed ambulating at home recommending that pt not walk more than 50-126ft at a time due to increased L lean with fatigue; pt in  agreement. With ambulation pt demonstrates step to pattern, downward gaze, decreased cadence but good awareness of slightest L lean. In ortho gym, pt performed ambulatory simulated car transfer with RW and CGA and ambulated 76ft on uneven surfaces (ramp) with RW and CGA. Pt performed 5x sit<>stand with close supervision in 15 seconds (average 11.4 for males age 22-69). Pt performed the following exercises sitting EOM with supervision and verbal cues for technique: -tricep extension on yoga blocks 3x12 -single arm bicep curls with 10lb dumbbell 3x10 bilaterally  -LAQ with 5lb ankle weight 3x10 bilaterally  Pt transported back to room in [facility] total A. Concluded session with pt sitting EOB with all needs within reach. Provided pt with fresh ice water.   Therapy Documentation Precautions:  Precautions Precautions: Fall,Other (comment) Precaution Comments: Diplopia, L lean Restrictions Weight Bearing Restrictions: No  Therapy/Group: Individual Therapy Martin Majestic PT, DPT   10/30/2020, 7:20 AM

## 2020-10-30 NOTE — Progress Notes (Signed)
Physical Therapy Session Note  Patient Details  Name: Dennis Florea Sr. MRN: [id number] Date of Birth: 1955/10/28  Today's Date: 10/30/2020 PT Individual Time: 0815-0900 PT Individual Time Calculation (min): 45 min   Short Term Goals: Week 1:  PT Short Term Goal 1 (Week 1): pt will transfer bed<>chair with LRAD and min A PT Short Term Goal 2 (Week 1): pt will ambulate 72f with LRAD and mod A of 1 PT Short Term Goal 3 (Week 1): Pt will initate stair navigation  Skilled Therapeutic Interventions/Progress Updates:   Pt received supine in bed and agreeable to PT. Supine>sit transfer with supervision  assist and cues to reduce use of bed rails. Pt donned shoes sitting EOB with supervision assist. Ambulatory transfer to bathroom to perform urination in standing. Min assist throughout urination to prevent LOB, with unoted trunkal support by leaning on wall. Pt performed oral hygiene sitting in WC at sink with set up assist from PT.   WC mobility through hall with supervision assist and cues for improve midline orientation in sitting while propelling WC [id number]  Gait training with RW x 100f12fth CGA fading to min-mod assist for last 25ft76f to increasing lateral lean/pushing to the L. Moderate cues for midline orientation and safety in turns as well as decreased step length on the R and increased step width on the L.   Dynamic standing balance to perform pregait stepping over hockey stick, foot tap on 6 inch cone and lateral reaches with visual scanning while using BITS x 2 min. Min assist throughout with min-mod cues for safety awareness to prvenet lateral LOB .  Pt returned to room and performed stand pivot transfer to bed with CGA and RW. Sit>supine completed with supervision assist, and left supine in bed with call bell in reach and all needs met.        Therapy Documentation Precautions:  Precautions Precautions: Fall,Other (comment) Precaution Comments: mild L  lean Restrictions Weight Bearing Restrictions: No    Pain: Pain Assessment Pain Scale: 0-10 Pain Score: 0-No pain     Trunk/Postural Assessment : Cervical Assessment Cervical Assessment: Within Functional Limits Thoracic Assessment Thoracic Assessment: Within Functional Limits Lumbar Assessment Lumbar Assessment: Exceptions to WFL ([facility] pelvic tilt) Postural Control Postural Control: Deficits on evaluation     Therapy/Group: Individual Therapy  AustiLorie Phenix/2022, 9:09 AM

## 2020-10-30 NOTE — Progress Notes (Signed)
PROGRESS NOTE   Subjective/Complaints: No complaints today Discussed working on stairs with therapy Pleased with his progress Hopeful he can go home tomorrow.   ROS: +diplopia, +facial numbness, nausea is well controlled.    Objective:   No results found. No results for input(s): WBC, HGB, HCT, PLT in the last 72 hours. No results for input(s): NA, K, CL, CO2, GLUCOSE, BUN, CREATININE, CALCIUM in the last 72 hours.  Intake/Output Summary (Last 24 hours) at 10/30/2020 0949 Last data filed at 10/30/2020 0710 Gross per 24 hour  Intake 836 ml  Output 1780 ml  Net -944 ml        Physical Exam: Vital Signs Blood pressure 126/74, pulse 61, temperature 98.3 F (36.8 C), resp. rate 18, height 6\' 1"  (1.854 m), weight 118.8 kg, SpO2 96 %. Gen: no distress, normal appearing HEENT: oral mucosa pink and moist, NCAT Cardio: Reg rate Chest: normal effort, normal rate of breathing Abd: soft, non-distended Ext: no edema Psych: pleasant, normal affect Musculoskeletal:  General: No swelling.  Cervical back: Normal range of motion.  Right lower leg: No edema.  Left lower leg: No edema.  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  Coloration: Skin is not jaundicedor pale.  Neurological:  Comments: Alert and oriented x 3. Normal insight and awareness. Intact Memory. Normal language and speech. Diplopia, dysphonia. Left hemi-facial sensory loss. Decreased depth perception with FTN, HTS. Strength nearly 5/5 in UE and LE bilaterally. Sensed pain and light touch in all 4 limbs.Strong left lateral lean Psychiatric:  Mood and Affect: Moodnormal.  Behavior: Behaviornormal.   Assessment/Plan: 1. Functional deficits which require 3+ hours per day of interdisciplinary therapy in a comprehensive inpatient rehab setting.  Physiatrist is providing close team  supervision and 24 hour management of active medical problems listed below.  Physiatrist and rehab team continue to assess barriers to discharge/monitor patient progress toward functional and medical goals  Care Tool:  Bathing    Body parts bathed by patient: Right arm,Left arm,Chest,Abdomen,Front perineal area,Buttocks,Right upper leg,Left upper leg,Right lower leg,Left lower leg,Face         Bathing assist Assist Level: Set up assist     Upper Body Dressing/Undressing Upper body dressing   What is the patient wearing?: Pull over shirt    Upper body assist Assist Level: Set up assist    Lower Body Dressing/Undressing Lower body dressing      What is the patient wearing?: Pants,Underwear/pull up     Lower body assist Assist for lower body dressing: Set up assist     Toileting Toileting    Toileting assist Assist for toileting: Minimal Assistance - Patient > 75% (Used urinal)     Transfers Chair/bed transfer  Transfers assist     Chair/bed transfer assist level: Contact Guard/Touching assist     Locomotion Ambulation   Ambulation assist      Assist level: Moderate Assistance - Patient 50 - 74% Assistive device: Walker-rolling Max distance: 187ft   Walk 10 feet activity   Assist     Assist level: Moderate Assistance - Patient - 50 - 74% Assistive device: Walker-rolling   Walk 50 feet  activity   Assist Walk 50 feet with 2 turns activity did not occur: Safety/medical concerns (L lean, decreased balance, visual impairments)  Assist level: Moderate Assistance - Patient - 50 - 74% Assistive device: Walker-rolling    Walk 150 feet activity   Assist Walk 150 feet activity did not occur: Safety/medical concerns (L lean, decreased balance, visual impairments)  Assist level: Moderate Assistance - Patient - 50 - 74% Assistive device: Walker-rolling    Walk 10 feet on uneven surface  activity   Assist Walk 10 feet on uneven surfaces activity  did not occur: Safety/medical concerns (L lean, decreased balance, visual impairments)         Wheelchair     Assist Will patient use wheelchair at discharge?: Yes Type of Wheelchair: Manual    Wheelchair assist level: Supervision/Verbal cueing Max wheelchair distance: >167ft    Wheelchair 50 feet with 2 turns activity    Assist        Assist Level: Supervision/Verbal cueing   Wheelchair 150 feet activity     Assist      Assist Level: Supervision/Verbal cueing   Blood pressure 126/74, pulse 61, temperature 98.3 F (36.8 C), resp. rate 18, height 6\' 1"  (1.854 m), weight 118.8 kg, SpO2 96 %.    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 Impaired mobility: d/c Lovenox since ambulating >700 feet. Discussed with patient.  -antiplatelet therapy: continue Aspirin 325 mg daily and Plavix 75 mg dailyx3 months then Plavix alone (from 5/6) 3. Pain Management:Denies d/c Neurontin 300 mg daily. Discussed with patient.  4. Mood:Provide emotional support -antipsychotic agents: N/A 5. Neuropsych: This patientiscapable of making decisions on hisown behalf. 6. Skin/Wound Care:Routine skin checks 7. Fluids/Electrolytes/Nutrition:pt has good appetite, d/c ensure supplements -electrolytes stable, no need to recheck.  8. Hyperlipidemia. Continue Crestor 9. Prediabetes. Hemoglobin A1c 5.8. SSI. Advised no added sugar/bread/pasta/rice and high intake of vegetable/nuts/olive oil in diet. Reviewed CBGs with him. Control improving.  10. BPH. ContinueProscar 5 mg daily -monitor voiding patterns 11. Nausea: continue zofran prn 12. Diplopia: continue tape over left eyeglass lens, helping 13.  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 Obesity BMI 34.55: provided dietary education 15. Disposition: discussed current performance with wife and stroke prevention strategies  LOS: 9 days A FACE TO FACE EVALUATION WAS PERFORMED  P Cato Liburd 10/30/2020, 9:49 AM

## 2020-10-30 NOTE — Progress Notes (Signed)
Physical Therapy Discharge Summary  Patient Details  Name: Dennis Duce Sr. MRN: [id number] Date of Birth: 02/08/1956  Today's Date: 10/31/2020 PT Individual Time: 1359-1428 PT Individual Time Calculation (min): 29 min  PT Missed Time: 31 minutes PT Missed Time Reason: D/C home   Patient has met 12 of 12 long term goals due to improved activity tolerance, improved balance, improved postural control, increased strength, improved attention, improved awareness and improved coordination. Patient to discharge at a wheelchair level Supervision. Patient's care partner is independent to provide the necessary physical assistance at discharge. Pt's wife attended family education training on 5/20 and verbalized and demonstrated confidence with all tasks to ensure safe discharge home.   Al goals met   Recommendation:  Patient will benefit from ongoing skilled PT services in outpatient setting to continue to advance safe functional mobility, address ongoing impairments in transfers, generalized strengthening, dynamic standing balance/coordination, gait training, stair navigation, NMR, endurance, and to minimize fall risk.  Equipment: 20x18 manual WC; pt already has RW  Reasons for discharge: treatment goals met and discharge from hospital  Patient/family agrees with progress made and goals achieved: Yes  Today's Interventions Received pt supine in bed with wife, Dennis Strickland, present for PT family education training. Session with emphasis on discharge planning, functional mobility/transfers, generalized strengthening, dynamic standing balance/coordination, ambulation, simulated car transfers, stair/curb navigation, and improved activity tolerance. Pt able to perform all squat<>pivot transfers without AD and supervision and set up transfers mod I. Pt performed WC mobility 150ft x 1 and 75ft x 1 using BUE mod I to 4M ortho gym. Pt performed ambulatory simulated car transfer with RW and supervision provided by  Dennis Strickland; cues to remain on pt's L side at all times due to tendency for L lean. Pt performed WC mobility 100ft using BUE and mod I to therapy gym and navigated 4 steps with L handrail and CGA x 2 trials using lateral stepping technique. Trial 1 with therapist and trial 2 with Dennis Strickland; cues to decrease cadence and ensure foot completely on step. Pt then navigated 1 6in curb with RW and CGA x 2 trials to simulate home entry. Trial 1 with therapist and trial 2 with Dennis Strickland; pt able to perform without cues for technique. Pt then ambulated >150ft with RW and CGA provided by therapist and additional 100ft with CGA provided by Dennis Strickland after seated rest break. Pt with mild increase in L lateral lean with fatigue. Educated pt/Dennis Strickland to avoiding ambulating any further than 50-100ft at a time due to pt's increased L lean with fatigue; both verbalized understanding. Pt transported back to room in WC. Pt and Dennis Strickland with no further questions regarding mobility and ready to discharge home. PA, Dan, notified and NT present at bedside. 31 minutes missed of skilled physical therapy due to pt D/C home.   PT Discharge Precautions/Restrictions Precautions Precautions: Fall;Other (comment) Precaution Comments: mild L lean Restrictions Weight Bearing Restrictions: No Cognition Overall Cognitive Status: Within Functional Limits for tasks assessed Arousal/Alertness: Awake/alert Orientation Level: Oriented X4 Memory: Appears intact Awareness: Appears intact Problem Solving: Appears intact Safety/Judgment: Appears intact Sensation Sensation Light Touch: Appears Intact Proprioception: Appears Intact Additional Comments: pt reports numbness along L half of face has improved as well as temperature when eating/drinking Coordination Gross Motor Movements are Fluid and Coordinated: No Fine Motor Movements are Fluid and Coordinated: Yes Coordination and Movement Description: mild uncoordination due to mild L hemi, L lean, visual  impairments, and decreased balance/postural control Finger Nose Finger Test: WFL Heel Shin Test:   WFL Motor  Motor Motor: Abnormal postural alignment and control;Hemiplegia Motor - Skilled Clinical Observations: mild uncoordination due to mild L hemi, L lean, visual impairments, and decreaed balance/postural control  Mobility Bed Mobility Bed Mobility: Rolling Right;Rolling Left;Supine to Sit;Sit to Supine Rolling Right: Independent with assistive device Rolling Left: Independent with assistive device Supine to Sit: Independent with assistive device Sit to Supine: Independent with assistive device Transfers Transfers: Sit to Stand;Stand to Sit;Stand Pivot Transfers;Squat Pivot Transfers Sit to Stand: Supervision/Verbal cueing Stand to Sit: Contact Guard/Touching assist Stand Pivot Transfers: Supervision/Verbal cueing Stand Pivot Transfer Details: Verbal cues for precautions/safety;Verbal cues for technique Stand Pivot Transfer Details (indicate cue type and reason): verbal cues to slow down and for awareness of L lean Squat Pivot Transfers: Supervision/Verbal cueing Transfer (Assistive device): Rolling walker Locomotion  Gait Ambulation: Yes Gait Assistance: Contact Guard/Touching assist Gait Distance (Feet): 150 Feet Assistive device: Rolling walker Gait Assistance Details: Verbal cues for gait pattern;Verbal cues for technique;Verbal cues for precautions/safety;Verbal cues for sequencing;Verbal cues for safe use of DME/AE Gait Assistance Details: verbal cues for energy conservation, decreased cadence, RW safety, and awareness of L lean with fatigue Gait Gait: Yes Gait Pattern: Impaired Gait Pattern: Decreased trunk rotation;Decreased stride length;Decreased step length - right;Decreased step length - left;Poor foot clearance - left;Poor foot clearance - right;Narrow base of support;Step-to pattern Gait velocity: decreased Stairs / Additional Locomotion Stairs: Yes Stairs  Assistance: Contact Guard/Touching assist Stair Management Technique: One rail Left Number of Stairs: 12 Height of Stairs: 6 Ramp: Contact Guard/touching assist (RW) Curb: Contact Guard/Touching assist (RW) Wheelchair Mobility Wheelchair Mobility: Yes Wheelchair Assistance: Independent with assistive device Wheelchair Propulsion: Both upper extremities Wheelchair Parts Management: Independent Distance: 150ft  Trunk/Postural Assessment  Cervical Assessment Cervical Assessment: Within Functional Limits Thoracic Assessment Thoracic Assessment: Within Functional Limits Lumbar Assessment Lumbar Assessment: Exceptions to WFL (posterior pelvic tilt) Postural Control Postural Control: Deficits on evaluation  Balance Balance Balance Assessed: Yes Static Sitting Balance Static Sitting - Balance Support: Feet supported;Bilateral upper extremity supported Static Sitting - Level of Assistance: 6: Modified independent (Device/Increase time) Dynamic Sitting Balance Dynamic Sitting - Balance Support: Feet supported;No upper extremity supported Dynamic Sitting - Level of Assistance: 6: Modified independent (Device/Increase time) Static Standing Balance Static Standing - Balance Support: Bilateral upper extremity supported (RW) Static Standing - Level of Assistance: 5: Stand by assistance (supervision) Dynamic Standing Balance Dynamic Standing - Balance Support: Right upper extremity supported Dynamic Standing - Level of Assistance: 5: Stand by assistance (supervision) Dynamic Standing - Comments: transfers only Extremity Assessment  RLE Assessment RLE Assessment: Within Functional Limits LLE Assessment LLE Assessment: Within Functional Limits  Anna M Johnson  Anna Johnson PT, DPT  10/30/2020, 7:39 AM   

## 2020-10-30 NOTE — Progress Notes (Signed)
Physical Therapy Session Note  Patient Details  Name: Dennis Strickland. MRN: [id number] Date of Birth: Apr 08, 1956  Today's Date: 10/30/2020 PT Individual Time: 0935-1002 PT Individual Time Calculation (min): 27 min   Short Term Goals: Week 1:  PT Short Term Goal 1 (Week 1): pt will transfer bed<>chair with LRAD and min A PT Short Term Goal 2 (Week 1): pt will ambulate 39ft with LRAD and mod A of 1 PT Short Term Goal 3 (Week 1): Pt will initate stair navigation  Skilled Therapeutic Interventions/Progress Updates:    Pt received supine in bed and agreeable to therapy session. Supine>sitting R EOB without assistance. Sit<>stands using RW with CGA for steadying during session. Gait training ~59ft x2 throughout room with 3 turns and into busy hallway with obstacle navigation, opening doors, and back into room with CGA becoming more frequent min assist as pt states he feels he is being "pulled to the left more strongly" at this time - frequently stopping to "reset" or "recalibrate" to midline - when having increased L lean pt will automatically transition gait mechanics to step-to leading with R LE in order for pt to maintain balance. Discussed preparation for ambulating in home environment and practicing more turns being the focus of this therapy session. Static standing balance with horizontal head turns x10reps with pt requiring 1x standing rest due to progressively worsening L lean - CGA for steadying. Pt left sitting EOB with needs in reach and bed alarm on.  Therapy Documentation Precautions:  Precautions Precautions: Fall,Other (comment) Precaution Comments: mild L lean Restrictions Weight Bearing Restrictions: No  Pain: Denies pain during session. Reports "just a little bit" of nausea.    Therapy/Group: Individual Therapy  Ginny Forth , PT, DPT, CSRS  10/30/2020, 7:51 AM

## 2020-10-31 LAB — GLUCOSE, CAPILLARY
Glucose-Capillary: 95 mg/dL (ref 70–99)
Glucose-Capillary: 108 mg/dL — ABNORMAL HIGH (ref 70–99)

## 2020-10-31 NOTE — Progress Notes (Signed)
Occupational Therapy Session Note  Patient Details  Name: Dennis Strickland. MRN: [id number] Date of Birth: 1956/04/28  Today's Date: 10/31/2020 OT Individual Time:  -       Short Term Goals: Week 1:  OT Short Term Goal 1 (Week 1): Patient will complete toileting with Min A. OT Short Term Goal 2 (Week 1): Patient will complete LB dressing/bathing min A with AE PRN. OT Short Term Goal 3 (Week 1): Patient will complete dynamic sitting balance ADL with no more than min verbal cues to maintain midline posture. OT Short Term Goal 4 (Week 1): Patient will complete stand pivot transfers min A consistently.  Skilled Therapeutic Interventions/Progress Updates:    Pt participated in rhythmic drumming group. Pain not reported during session. Focus of group on BUE coordination, strengthening, endurance, timing/control, activity tolerance, and social participation and engagement. Pt performs session from seated position for energy conservation. Skilled interventions included grading movement up, slowing pacing of movements to improve access to rhythm. Warm up performed prior to exercises and UB stretching completed at end of group with demo from OT. Pt able to select preferred song to share with group. Returned pt to room at end of session. Exited session with pt seated in bed, exit alarm on and call light in reach  Therapy Documentation Precautions:  Precautions Precautions: Fall,Other (comment) Precaution Comments: mild L lean Restrictions Weight Bearing Restrictions: No General:   Vital Signs: Therapy Vitals Temp: 97.8 F (36.6 C) Pulse Rate: 73 Resp: 18 BP: 128/82 Patient Position (if appropriate): Lying Oxygen Therapy O2 Device: Room Air Pain:   ADL: ADL Eating: Set up Where Assessed-Eating: Bed level Grooming: Setup Where Assessed-Grooming: Sitting at sink,Wheelchair Upper Body Bathing: Contact guard Where Assessed-Upper Body Bathing: Shower (TTB) Lower Body Bathing: Contact  guard Where Assessed-Lower Body Bathing: Shower (TTB) Upper Body Dressing: Minimal assistance Where Assessed-Upper Body Dressing: Other (Comment) (Sitting in shower (TTB)) Lower Body Dressing: Moderate assistance Where Assessed-Lower Body Dressing: Other (Comment) (Sitting and in stance in shower (TTB, grab bars)) Toileting: Moderate assistance Where Assessed-Toileting: Bedside Commode Toilet Transfer: Moderate assistance Toilet Transfer Method: Sit pivot,Squat pivot Toilet Transfer Equipment: Drop arm bedside commode,Grab bars Film/video editor: Moderate assistance Film/video editor Method: Sit pivot,Squat pivot Astronomer: Dance movement psychotherapist    Praxis   Exercises:   Other Treatments:     Therapy/Group: Group Therapy  Shon Hale 10/31/2020, 6:59 AM

## 2020-10-31 NOTE — Progress Notes (Signed)
Occupational Therapy Session Note  Patient Details  Name: Dennis Costlow Sr. MRN: [id number] Date of Birth: [birth year]  Today's Date: 10/31/2020 OT Individual Time: 1300-1353 OT Individual Time Calculation (min): 53 min    Short Term Goals: Week 1:  OT Short Term Goal 1 (Week 1): Patient will complete toileting with Min A. OT Short Term Goal 2 (Week 1): Patient will complete LB dressing/bathing min A with AE PRN. OT Short Term Goal 3 (Week 1): Patient will complete dynamic sitting balance ADL with no more than min verbal cues to maintain midline posture. OT Short Term Goal 4 (Week 1): Patient will complete stand pivot transfers min A consistently.  Skilled Therapeutic Interventions/Progress Updates:    Treatment session with focus on hands on education with pt and pt's wife, [name].  Pt received seated EOB reporting need to toilet.  Pt completed sit > stand to RW with supervision.  Pt ambulated to bathroom with RW with close supervision.  Therapist educating pt's wife on hand placement and body positioning to provide physical assistance as needed.  Pt completed toileting Mod I with RW.  Pt propelled w/c to ADL apt Mod I on/off elevator.  Completed ambulatory transfers to toilet with RW with demonstration from therapist followed by wife providing close supervision during toilet transfer.  Therapist set up 3" shower ledge to complete simulated shower transfer.  Pt completed transfer stepping backwards over ledge with RW with close supervision x2 with wife providing supervision.  Pt's wife reports having shower chair with arm rests.  Engaged in simulated meal prep and housekeeping from w/c level with focus on improved positioning of w/c to maintain safety.  Pt's wife reports feeling much better about pt mobility after session and ready for d/c.  Discussed energy conservation strategies and recommended only walking in/out of bathroom but using w/c as primary transportation.  Pt and wife report understanding  of recommendations.  Therapy Documentation Precautions:  Precautions Precautions: Fall,Other (comment) Precaution Comments: mild L lean Restrictions Weight Bearing Restrictions: No General:   Vital Signs: Therapy Vitals Temp: 98.9 F (37.2 C) Temp Source: Oral Pulse Rate: 98 Resp: 18 BP: 114/72 Patient Position (if appropriate): Sitting Oxygen Therapy SpO2: 97 % O2 Device: Room Air Pain:  Pt with no c/o pain   Therapy/Group: Individual Therapy  Rosalio Loud 10/31/2020, 2:15 PM

## 2020-10-31 NOTE — Progress Notes (Signed)
PROGRESS NOTE   Subjective/Complaints: No new issues. Feels prepared for dc today. Family ed before leaving this afternoon  ROS: Patient denies fever, rash, sore throat, blurred vision, nausea, vomiting, diarrhea, cough, shortness of breath or chest pain, joint or back pain, headache, or mood change. .    Objective:   No results found. No results for input(s): WBC, HGB, HCT, PLT in the last 72 hours. No results for input(s): NA, K, CL, CO2, GLUCOSE, BUN, CREATININE, CALCIUM in the last 72 hours.  Intake/Output Summary (Last 24 hours) at 10/31/2020 0915 Last data filed at 10/31/2020 0720 Gross per 24 hour  Intake 472 ml  Output 1350 ml  Net -878 ml        Physical Exam: Vital Signs Blood pressure 131/79, pulse 73, temperature 97.8 F (36.6 C), resp. rate 18, height 6\' 1"  (1.854 m), weight 118.8 kg, SpO2 94 %. Constitutional: No distress . Vital signs reviewed. HEENT: EOMI, oral membranes moist Neck: supple Cardiovascular: RRR without murmur. No JVD    Respiratory/Chest: CTA Bilaterally without wheezes or rales. Normal effort    GI/Abdomen: BS +, non-tender, non-distended Ext: no clubbing, cyanosis, or edema Psych: pleasant and cooperative Musculoskeletal:  General: No swelling.  Cervical back: Normal range of motion.  Right lower leg: No edema.  Left lower leg: No edema.  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  Coloration: Skin is not jaundicedor pale.  Neurological:  Comments: Alert and oriented x 3. Normal insight and awareness. Intact Memory. Normal language and speech. Dysphonic.  Left hemi-facial sensory loss. Decreased depth perception with FTN, HTS. Strength nearly 5/5 in UE and LE bilaterally. Sensed pain and light touch in all 4 limbs.improved sitting balance     Assessment/Plan: 1. Functional deficits which require 3+ hours per day of  interdisciplinary therapy in a comprehensive inpatient rehab setting.  Physiatrist is providing close team supervision and 24 hour management of active medical problems listed below.  Physiatrist and rehab team continue to assess barriers to discharge/monitor patient progress toward functional and medical goals  Care Tool:  Bathing    Body parts bathed by patient: Right arm,Left arm,Chest,Abdomen,Front perineal area,Buttocks,Right upper leg,Left upper leg,Right lower leg,Left lower leg,Face         Bathing assist Assist Level: Set up assist     Upper Body Dressing/Undressing Upper body dressing   What is the patient wearing?: Pull over shirt    Upper body assist Assist Level: Set up assist    Lower Body Dressing/Undressing Lower body dressing      What is the patient wearing?: Pants,Underwear/pull up     Lower body assist Assist for lower body dressing: Set up assist     Toileting Toileting    Toileting assist Assist for toileting: Minimal Assistance - Patient > 75% (Used urinal)     Transfers Chair/bed transfer  Transfers assist     Chair/bed transfer assist level: Contact Guard/Touching assist Chair/bed transfer assistive device:   Ambulation assist      Assist level: Contact Guard/Touching assist Assistive device: Walker-rolling Max distance: 23ft   Walk 10 feet activity  Assist     Assist level: Contact Guard/Touching assist Assistive device: Walker-rolling   Walk 50 feet activity   Assist Walk 50 feet with 2 turns activity did not occur: Safety/medical concerns (L lean, decreased balance, visual impairments)  Assist level: Contact Guard/Touching assist Assistive device: Walker-rolling    Walk 150 feet activity   Assist Walk 150 feet activity did not occur: Safety/medical concerns (L lean, decreased balance, visual impairments)  Assist level: Moderate Assistance - Patient - 50 - 74% Assistive  device: Walker-rolling    Walk 10 feet on uneven surface  activity   Assist Walk 10 feet on uneven surfaces activity did not occur: Safety/medical concerns (L lean, decreased balance, visual impairments)   Assist level: Contact Guard/Touching assist Assistive device: Photographer Will patient use wheelchair at discharge?: Yes Type of Wheelchair: Manual    Wheelchair assist level: Supervision/Verbal cueing Max wheelchair distance: >172ft    Wheelchair 50 feet with 2 turns activity    Assist        Assist Level: Supervision/Verbal cueing   Wheelchair 150 feet activity     Assist      Assist Level: Supervision/Verbal cueing   Blood pressure 131/79, pulse 73, temperature 97.8 F (36.6 C), resp. rate 18, height 6\' 1"  (1.854 m), weight 118.8 kg, SpO2 94 %.    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  2. Impaired mobility: ambulating  -antiplatelet therapy: continue Aspirin 325 mg daily and Plavix 75 mg dailyx3 months then Plavix alone (from 5/6) 3. Pain Management:Denies d/c Neurontin 300 mg daily. Discussed with patient.  4. Mood:Provide emotional support -antipsychotic agents: N/A 5. Neuropsych: This patientiscapable of making decisions on hisown behalf. 6. Skin/Wound Care:Routine skin checks 7. Fluids/Electrolytes/Nutrition:pt has good appetite, d/c ensure supplements -electrolytes stable, no need to recheck.  8. Hyperlipidemia. Continue Crestor 9. Prediabetes. Hemoglobin A1c 5.8. SSI. Advised no added sugar/bread/pasta/rice and high intake of vegetable/nuts/olive oil in diet. Reviewed CBGs with him.  Control improving.  10. BPH. ContinueProscar 5 mg daily -voiding well 11. Nausea: continue zofran prn 12. Diplopia: continue tape over left eyeglass lens, helping 13.  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 Obesity BMI 34.55: provided dietary education    LOS: 10 days A FACE TO FACE EVALUATION WAS PERFORMED  10/31/2020, 9:15 AM

## 2020-10-31 NOTE — Progress Notes (Signed)
Occupational Therapy Discharge Summary  Patient Details  Name: Dennis Mathieu Sr. MRN: [id number] Date of Birth: Oct 18, 1955   Patient has met 12 of 12 long term goals due to improved activity tolerance, improved balance, postural control, ability to compensate for deficits and improved awareness.  Patient to discharge at overall Supervision level.  Patient's care partner is independent to provide the necessary physical assistance at discharge.  Patient's wife attended hands on family education session prior to d/c home.  Pt's wife demonstrated ability to provide CGA to close supervision stand pivot and short distance ambulatory transfers in bathroom setting.    Reasons goals not met: N/A  Recommendation:  Patient will benefit from ongoing skilled OT services in outpatient setting to continue to advance functional skills in the area of BADL and Reduce care partner burden.  Equipment: No equipment provided  Reasons for discharge: treatment goals met and discharge from hospital  Patient/family agrees with progress made and goals achieved: Yes  OT Discharge Precautions/Restrictions  Precautions Precautions: Fall;Other (comment) Precaution Comments: mild L lean General   Vital Signs Therapy Vitals Temp: 97.8 F (36.6 C) Pulse Rate: 73 Resp: 18 BP: 131/79 Patient Position (if appropriate): Lying Oxygen Therapy O2 Device: Room Air Pain  Pt with no c/o pain ADL ADL Eating: Set up Where Assessed-Eating: Edge of bed Grooming: Modified independent Where Assessed-Grooming: Sitting at sink,Wheelchair Upper Body Bathing: Modified independent Where Assessed-Upper Body Bathing: Shower Lower Body Bathing: Setup Where Assessed-Lower Body Bathing: Shower Upper Body Dressing: Setup Where Assessed-Upper Body Dressing: Other (Comment) (sitting in shower on tub bench) Lower Body Dressing: Setup Where Assessed-Lower Body Dressing: Other (Comment) (sit > stand from tub bench in  shower) Toileting: Supervision/safety Where Assessed-Toileting: Bedside Commode Toilet Transfer: Close supervision Toilet Transfer Method: Engineer, water: Drop arm bedside commode,Grab bars Social research officer, government: Close supervision Social research officer, government Method: Heritage manager: Transfer tub bench,Grab bars Vision Baseline Vision/History: Wears glasses Wears Glasses: Reading only Patient Visual Report: Diplopia;Nausea/blurring vision with head movement Vision Assessment?: Vision impaired- to be further tested in functional context Saccades: Impaired - to be further tested in functional context Convergence: Impaired (comment) Diplopia Assessment: Disappears with one eye closed;Objects split on top of one another;Present in near gaze;Present in far gaze;Present all the time/all directions Depth Perception: Undershoots;Overshoots Additional Comments: diplopia improved when wearing taped glasses.  pt reports improving symptoms when wearing modified glasses Perception  Perception: Within Functional Limits Praxis Praxis: Intact Cognition Overall Cognitive Status: Within Functional Limits for tasks assessed Arousal/Alertness: Awake/alert Orientation Level: Oriented X4 Attention: Divided Divided Attention: Appears intact Memory: Appears intact Awareness: Appears intact Problem Solving: Appears intact Safety/Judgment: Appears intact Sensation Sensation Light Touch: Appears Intact Proprioception: Appears Intact Additional Comments: pt reports numbness along L half of face has improved as well as temperature when eating/drinking Coordination Gross Motor Movements are Fluid and Coordinated: No Fine Motor Movements are Fluid and Coordinated: Yes Coordination and Movement Description: mild uncoordination due to mild L hemi, L lean, visual impairments, and decreased balance/postural control Finger Nose Finger Test: [employer] Heel Shin Test:  [facility] Motor  Motor Motor: Abnormal postural alignment and control;Hemiplegia Motor - Skilled Clinical Observations: mild uncoordination due to mild L hemi, L lean, visual impairments, and decreaed balance/postural control Mobility  Bed Mobility Bed Mobility: Rolling Right;Rolling Left;Supine to Sit;Sit to Supine Rolling Right: Independent with assistive device Rolling Left: Independent with assistive device Supine to Sit: Independent with assistive device Sit to Supine: Independent with assistive device Transfers Sit  to Stand: Supervision/Verbal cueing Stand to Sit: Contact Guard/Touching assist  Trunk/Postural Assessment  Cervical Assessment Cervical Assessment: Within Functional Limits Thoracic Assessment Thoracic Assessment: Within Functional Limits Lumbar Assessment Lumbar Assessment: Exceptions to [facility] (posterior pelvic tilt) Postural Control Postural Control: Deficits on evaluation  Balance Balance Balance Assessed: Yes Static Sitting Balance Static Sitting - Balance Support: Feet supported;Bilateral upper extremity supported Static Sitting - Level of Assistance: 6: Modified independent (Device/Increase time) Dynamic Sitting Balance Dynamic Sitting - Balance Support: Feet supported;No upper extremity supported Dynamic Sitting - Level of Assistance: 6: Modified independent (Device/Increase time) Static Standing Balance Static Standing - Balance Support: Bilateral upper extremity supported (RW) Static Standing - Level of Assistance: 5: Stand by assistance (supervision) Dynamic Standing Balance Dynamic Standing - Balance Support: Right upper extremity supported Dynamic Standing - Level of Assistance: 5: Stand by assistance (supervision) Extremity/Trunk Assessment RUE Assessment RUE Assessment: Within Functional Limits LUE Assessment LUE Assessment: Within Functional Limits   Pantelis Elgersma, [facility] 10/31/2020, 8:03 AM

## 2020-11-28 ENCOUNTER — Encounter
Payer: Medicare Other | Attending: Physical Medicine and Rehabilitation | Admitting: Physical Medicine and Rehabilitation

## 2020-11-28 ENCOUNTER — Other Ambulatory Visit: Payer: Self-pay

## 2020-11-28 VITALS — BP 115/68 | HR 80 | Temp 99.3°F | Ht 73.0 in | Wt 250.0 lb

## 2020-11-28 DIAGNOSIS — I10 Essential (primary) hypertension: Secondary | ICD-10-CM | POA: Insufficient documentation

## 2020-11-28 DIAGNOSIS — I639 Cerebral infarction, unspecified: Secondary | ICD-10-CM | POA: Insufficient documentation

## 2020-11-28 DIAGNOSIS — E785 Hyperlipidemia, unspecified: Secondary | ICD-10-CM | POA: Diagnosis not present

## 2020-11-28 MED ORDER — LISINOPRIL 10 MG PO TABS: 10.0000 mg | ORAL_TABLET | Freq: Every day | ORAL | 3 refills | Status: AC

## 2020-11-28 MED ORDER — CLOPIDOGREL BISULFATE 75 MG PO TABS: 75.0000 mg | ORAL_TABLET | Freq: Every day | ORAL | 3 refills | Status: AC

## 2020-11-28 MED ORDER — ROSUVASTATIN CALCIUM 40 MG PO TABS: 40.0000 mg | ORAL_TABLET | Freq: Every day | ORAL | 0 refills | Status: DC

## 2020-11-28 NOTE — Patient Instructions (Signed)
HTN: -BP is 115/68 today.  -Advised checking BP daily at home and logging results to bring into follow-up appointment with her PCP and myself. -Reviewed BP meds today.  -Advised regarding healthy foods that can help lower blood pressure and provided with a list: 1) citrus foods- high in vitamins and minerals 2) salmon and other fatty fish - reduces inflammation and oxylipins 3) swiss chard (leafy green)- high level of nitrates 4) pumpkin seeds- one of the best natural sources of magnesium 5) Beans and lentils- high in fiber, magnesium, and potassium 6) Berries- high in flavonoids 7) Amaranth (whole grain, can be cooked similarly to rice and oats)- high in magnesium and fiber 8) Pistachios- even more effective at reducing BP than other nuts 9) Carrots- high in phenolic compounds that relax blood vessels and reduce inflammation 10) Celery- contain phthalides that relax tissues of arterial walls 11) Tomatoes- can also improve cholesterol and reduce risk of heart disease 12) Broccoli- good source of magnesium, calcium, and potassium 13) Greek yogurt: high in potassium and calcium 14) Herbs and spices: Celery seed, cilantro, saffron, lemongrass, black cumin, ginseng, cinnamon, cardamom, sweet basil, and ginger 15) Chia and flax seeds- also help to lower cholesterol and blood sugar 16) Beets- high levels of nitrates that relax blood vessels  17) spinach and bananas- high in potassium 18) cinnamon, ceylon  -Provided lise of supplements that can help with hypertension:  1) magnesium: one high quality brand is Bioptemizers since it contains all 7 types of magnesium, otherwise over the counter magnesium gluconate 400mg  is a good option 2) B vitamins 3) vitamin D 4) potassium 5) CoQ10 6) L-arginine 7) Vitamin C 8) Beetroot -Educated that goal BP is 120/80. -Made goal to incorporate some of the above foods into diet.

## 2020-11-28 NOTE — Progress Notes (Signed)
Subjective:    Patient ID: Dennis Bis Sr., male    DOB: [birth year], 65 y.o.   MRN: [id number]  HPI Dennis Strickland is a 65 year old man who presents for follow-up of CVA  1) HTN: BP is very well controlled  2) Obesity: has lost a lot of weight.   3) double vision: has eye doctor appointment in August.   4) Hoarse voice: worsens with more talking.   5) impaired mobility an ADLs: -600-700 feet around a track during the sessions twice per week -using exercise bike.  Pain Inventory Average Pain 0 Pain Right Now 0 My pain is  na  LOCATION OF PAIN  na  BOWEL Number of stools per week: 2 Oral laxative use No  Type of laxative na Enema or suppository use No  History of colostomy No  Incontinent No   BLADDER Normal In and out cath, frequency na Able to self cath  na Bladder incontinence No  Frequent urination No  Leakage with coughing No  Difficulty starting stream No  Incomplete bladder emptying No    Mobility use a walker ability to climb steps?  yes do you drive?  no use a wheelchair  Function employed # of hrs/week   what is your job? [employer] t[employer] I need assistance with the following:  bathing, meal prep, and household duties  Neuro/Psych numbness tingling trouble walking  Prior Studies HFU  Physicians involved in your care HFU   Family History  Problem Relation Age of Onset   Hypertension Mother    CVA Sister    Lupus Sister    CVA Brother 43   Social History   Socioeconomic History   Marital status: Married    Spouse name: Not on file   Number of children: Not on file   Years of education: Not on file   Highest education level: Not on file  Occupational History   Occupation: Curator  Tobacco Use   Smoking status: Never   Smokeless tobacco: Never  Substance and Sexual Activity   Alcohol use: Never   Drug use: Never   Sexual activity: Not on file  Other Topics Concern   Not on file  Social History Narrative   Not on file   Social  Determinants of Health   Financial Resource Strain: Not on file  Food Insecurity: Not on file  Transportation Needs: Not on file  Physical Activity: Not on file  Stress: Not on file  Social Connections: Not on file   Past Surgical History:  Procedure Laterality Date   IR ANGIO INTRA EXTRACRAN SEL INTERNAL CAROTID BILAT MOD SED  10/20/2020   IR ANGIO VERTEBRAL SEL SUBCLAVIAN INNOMINATE UNI L MOD SED  10/20/2020   IR ANGIO VERTEBRAL SEL VERTEBRAL UNI R MOD SED  10/20/2020   IR US GUIDE VASC ACCESS RIGHT  10/20/2020   TOTAL KNEE ARTHROPLASTY     Past Medical History:  Diagnosis Date   BPH (benign prostatic hyperplasia)    Dyslipidemia    Hypertension    Impaired glucose tolerance    TIA (transient ischemic attack) 02/2020   BP 115/68 (BP Location: Left Arm, Patient Position: Sitting, Cuff Size: Normal)   Pulse 80   Temp 99.3 F (37.4 C) (Oral)   Ht 6\' 1"  (1.854 m)   Wt 250 lb (113.4 kg)   SpO2 95%   BMI 32.98 kg/m   Opioid Risk Score:   Fall Risk Score:  `1  Depression screen PHQ 2/9  Depression  screen PHQ 2/9 11/28/2020  Decreased Interest 0  Down, Depressed, Hopeless 0  PHQ - 2 Score 0  Altered sleeping 1  Tired, decreased energy 0  Change in appetite 0  Feeling bad or failure about yourself  0  Trouble concentrating 0  Moving slowly or fidgety/restless 0  Suicidal thoughts 0  PHQ-9 Score 1  Difficult doing work/chores Not difficult at all     Review of Systems  Musculoskeletal:  Positive for gait problem.  Neurological:  Positive for numbness.       Tingling  All other systems reviewed and are negative.     Objective:   Physical Exam Gen: no distress, normal appearing HEENT: oral mucosa pink and moist, NCAT Cardio: Reg rate Chest: normal effort, normal rate of breathing Abd: soft, non-distended Ext: no edema Psych: pleasant, normal affect Skin: intact Neuro: Alert and oriented x3, double vision. In wheelchair.      Assessment & Plan:   1)CVA: -continue plavix -f/u with neurology in august -continue aspirin  2) HTN -continue lisinopril 10mg .  HTN: -BP is 115/68 today.  -Advised checking BP daily at home and logging results to bring into follow-up appointment with her PCP and myself. -Reviewed BP meds today.  -Advised regarding healthy foods that can help lower blood pressure and provided with a list: 1) citrus foods- high in vitamins and minerals 2) salmon and other fatty fish - reduces inflammation and oxylipins 3) swiss chard (leafy green)- high level of nitrates 4) pumpkin seeds- one of the best natural sources of magnesium 5) Beans and lentils- high in fiber, magnesium, and potassium 6) Berries- high in flavonoids 7) Amaranth (whole grain, can be cooked similarly to rice and oats)- high in magnesium and fiber 8) Pistachios- even more effective at reducing BP than other nuts 9) Carrots- high in phenolic compounds that relax blood vessels and reduce inflammation 10) Celery- contain phthalides that relax tissues of arterial walls 11) Tomatoes- can also improve cholesterol and reduce risk of heart disease 12) Broccoli- good source of magnesium, calcium, and potassium 13) Greek yogurt: high in potassium and calcium 14) Herbs and spices: Celery seed, cilantro, saffron, lemongrass, black cumin, ginseng, cinnamon, cardamom, sweet basil, and ginger 15) Chia and flax seeds- also help to lower cholesterol and blood sugar 16) Beets- high levels of nitrates that relax blood vessels  17) spinach and bananas- high in potassium  -Provided lise of supplements that can help with hypertension:  1) magnesium: one high quality brand is Bioptemizers since it contains all 7 types of magnesium, otherwise over the counter magnesium gluconate 400mg  is a good option 2) B vitamins 3) vitamin D 4) potassium 5) CoQ10 6) L-arginine 7) Vitamin C 8) Beetroot -Educated that goal BP is 120/80. -Made goal to incorporate some of the above  foods into diet.    3) Hypertriglyceremia: -refilled statin.  4) Impaired mobility: -continue PT and OT

## 2020-12-08 ENCOUNTER — Other Ambulatory Visit: Payer: Self-pay | Admitting: Physical Medicine and Rehabilitation

## 2020-12-08 ENCOUNTER — Telehealth: Payer: Self-pay | Admitting: Physical Medicine and Rehabilitation

## 2020-12-08 DIAGNOSIS — J3489 Other specified disorders of nose and nasal sinuses: Secondary | ICD-10-CM

## 2020-12-08 NOTE — Telephone Encounter (Signed)
Contacted family. They state that it is nasal drainage. Post-nasal. Frequent nose blowing. Concern if it is stroke related. If sinus cavity could be affected by stroke?

## 2020-12-08 NOTE — Telephone Encounter (Signed)
Patient has drainage on same side as stroke, wants to know if that is normal?  Or do they need to see someone regarding it, discharge is clear.

## 2020-12-10 NOTE — Telephone Encounter (Signed)
Patient prefers ENT referral in the [place]

## 2020-12-23 ENCOUNTER — Other Ambulatory Visit: Payer: Self-pay | Admitting: Physical Medicine and Rehabilitation

## 2021-03-16 ENCOUNTER — Other Ambulatory Visit: Payer: Self-pay | Admitting: Physical Medicine and Rehabilitation

## 2022-01-27 IMAGING — MR MR HEAD W/O CM
10 of 11 series · 43 of 48 positions shown · non-contrast
Comparison: 10/17/2020 brain MRI

CLINICAL DATA: Stroke follow-up

EXAM:
MRI HEAD WITHOUT CONTRAST
TECHNIQUE: Multiplanar, multiecho pulse sequences of the brain and surrounding
structures were obtained without intravenous contrast.

[Series 5: DWI · axial · 3.0mm · 0.88mm/px · z∈[-56,+85]mm · 10 of 100 slices shown (1 of 4)]
[im 1/100]
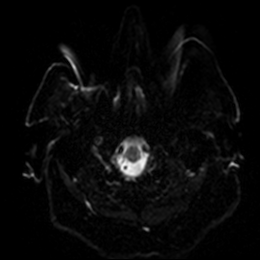
[im 12/100]
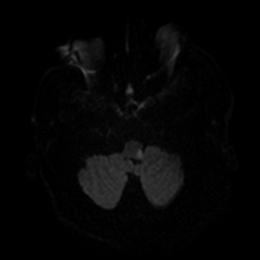
[im 23/100]
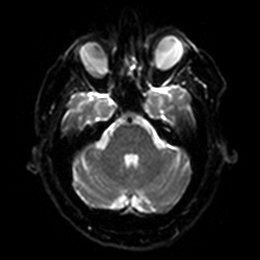
[im 34/100]
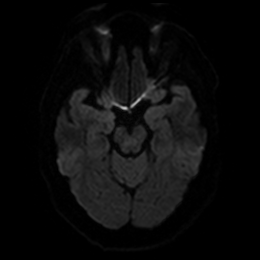
[im 45/100]
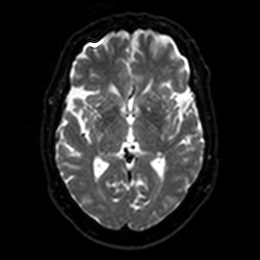
[im 56/100]
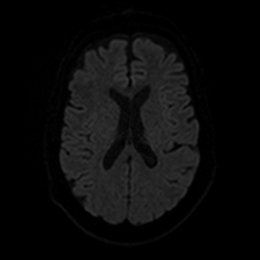
[im 67/100]
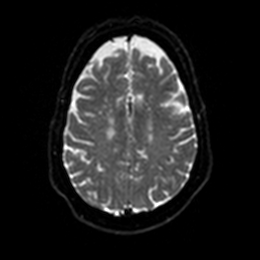
[im 78/100]
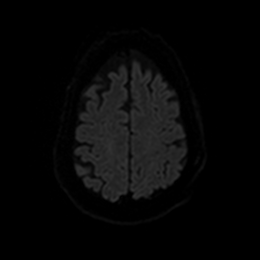
[im 89/100]
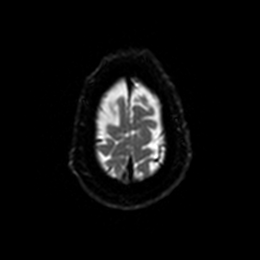
[im 100/100]
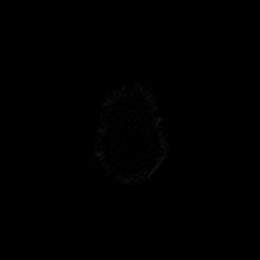

[Series 6: DWI · axial · 3.0mm · 0.88mm/px · z∈[-56,+85]mm · 5 of 50 slices shown (2 of 4)]
[im 1/50]
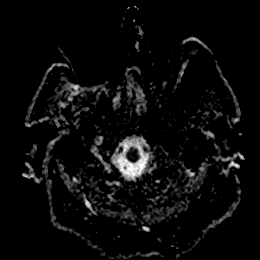
[im 13/50]
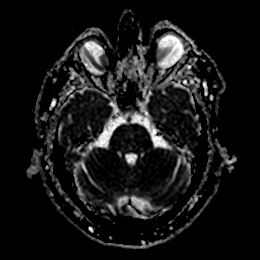
[im 25/50]
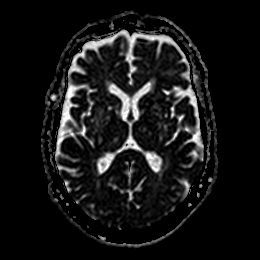
[im 37/50]
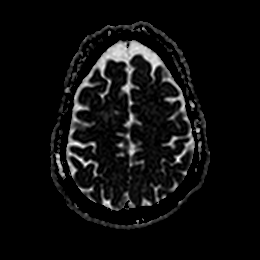
[im 50/50]
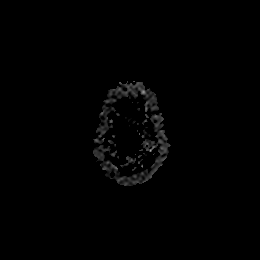

[Series 7: DWI · coronal · 4.0mm · 0.88mm/px · 6 of 66 slices shown (3 of 4)]
[im 1/66]
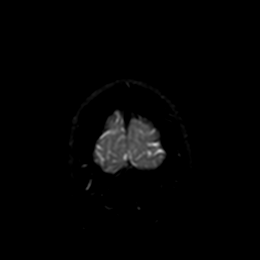
[im 14/66]
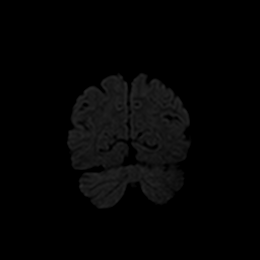
[im 27/66]
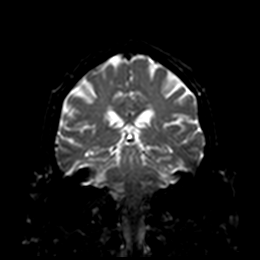
[im 40/66]
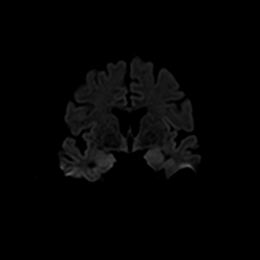
[im 53/66]
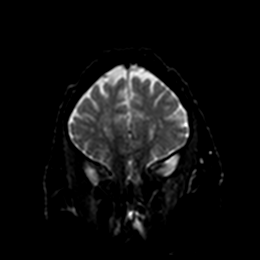
[im 66/66]
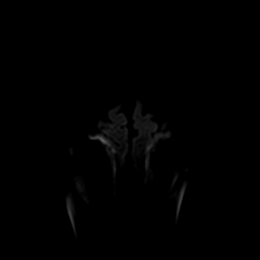

[Series 8: DWI · coronal · 4.0mm · 0.88mm/px · 3 of 33 slices shown (4 of 4)]
[im 1/33]
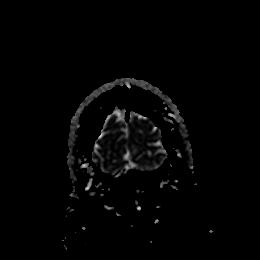
[im 17/33]
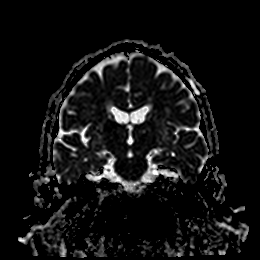
[im 33/33]
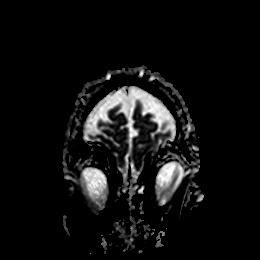

[Series 9: T1 · sagittal · 5.0mm · 0.75mm/px · 2 of 23 slices shown]
[im 1/23]
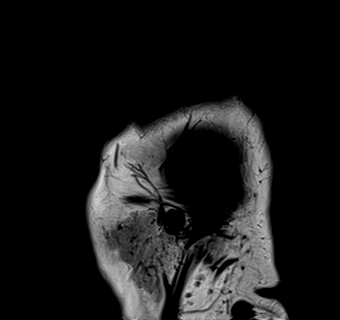
[im 23/23]
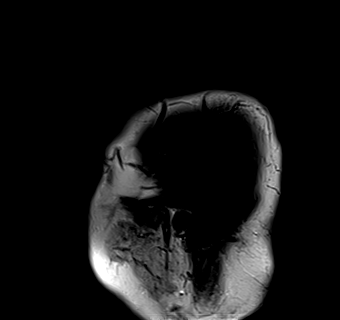

[Series 10: T2 · axial · 5.0mm · 0.72mm/px · z∈[-60,+89]mm · 2 of 27 slices shown (1 of 2)]
[im 1/27]
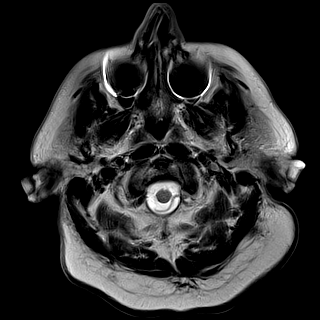
[im 27/27]
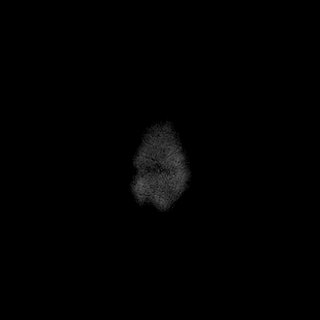

[Series 11: FLAIR · axial · 5.0mm · 0.45mm/px · z∈[-63,+86]mm · 2 of 27 slices shown]
[im 1/27]
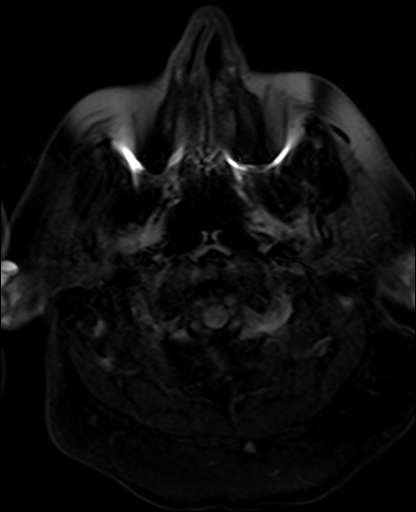
[im 27/27]
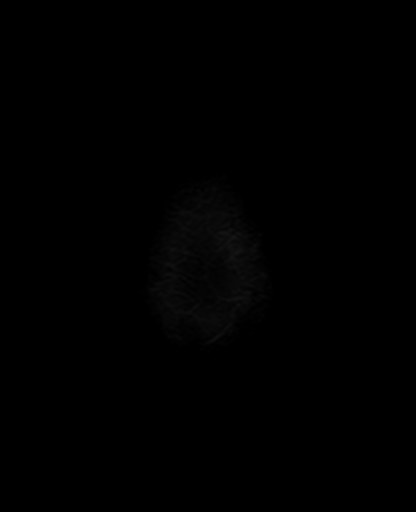

[Series 13: pha_images · axial · 3.0mm · 0.90mm/px · z∈[-73,+96]mm · 5 of 58 slices shown]
[im 1/58]
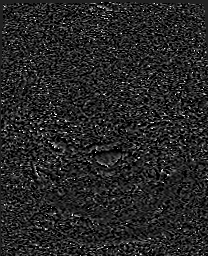
[im 15/58]
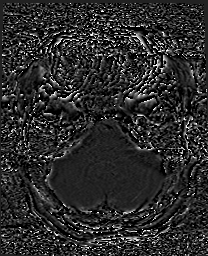
[im 29/58]
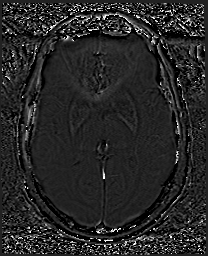
[im 43/58]
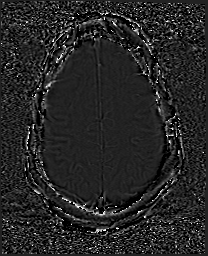
[im 58/58]
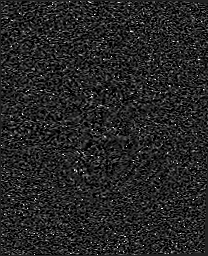

[Series 14: swi_images · axial · 3.0mm · 0.90mm/px · z∈[-73,+96]mm · 5 of 60 slices shown]
[im 1/60]
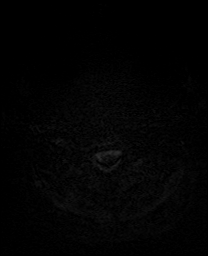
[im 15/60]
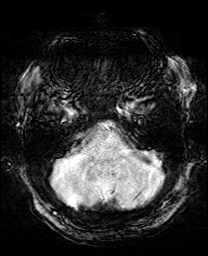
[im 30/60]
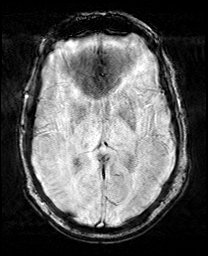
[im 45/60]
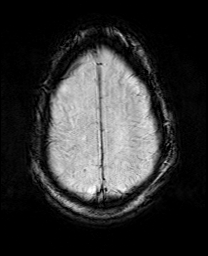
[im 60/60]
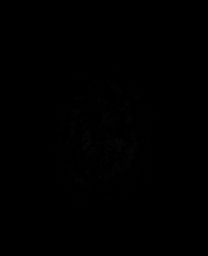

[Series 17: T2 · coronal · 5.0mm · 0.34mm/px · 3 of 29 slices shown (2 of 2)]
[im 1/29]
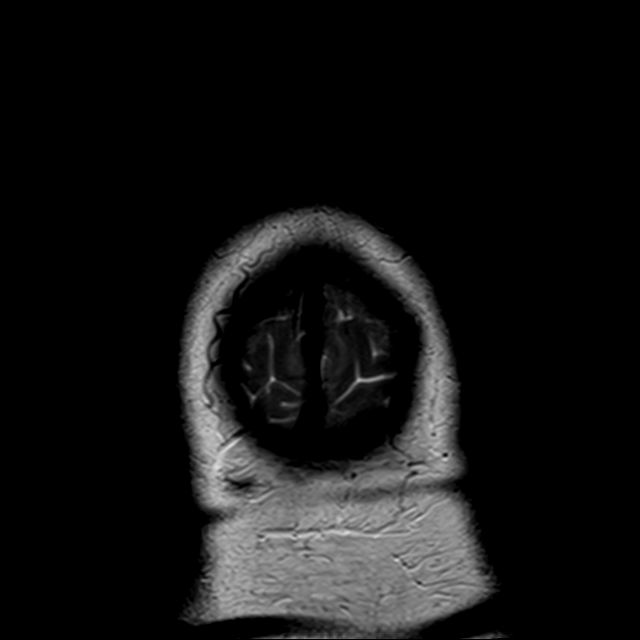
[im 15/29]
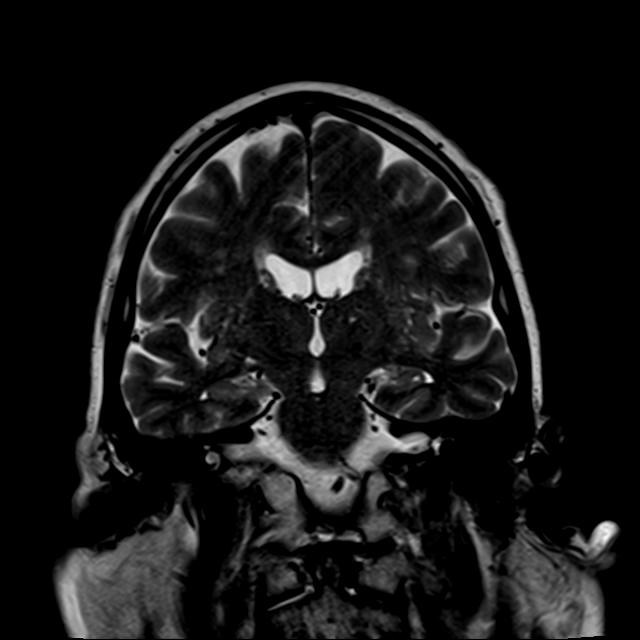
[im 29/29]
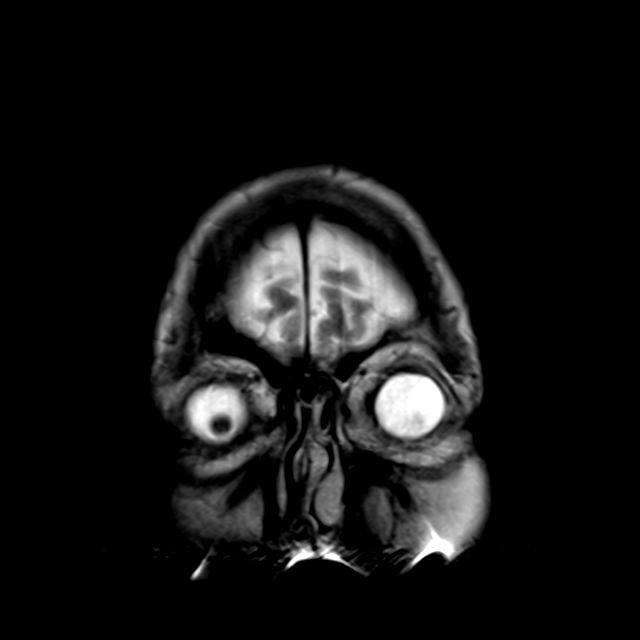

[43 of 48 positions shown; findings below may reference images not displayed]

FINDINGS: Brain: There is a new focus of abnormal diffusion restriction within
the left dorsal medulla oblongata ([DATE]). No other diffusion
abnormality. No acute hemorrhage. No mass effect. There is
multifocal hyperintense T2-weighted signal within the white matter.
Parenchymal volume and CSF spaces are normal. The midline structures
are normal.

Vascular: Major flow voids are preserved.

Skull and upper cervical spine: Normal calvarium and skull base.
Visualized upper cervical spine and soft tissues are normal.

Sinuses/Orbits:No paranasal sinus fluid levels or advanced mucosal
thickening. No mastoid or middle ear effusion. Normal orbits.
IMPRESSION: New focus of abnormal diffusion restriction within the left dorsal
medulla oblongata, consistent with acute to subacute infarct.

## 2022-07-26 ENCOUNTER — Other Ambulatory Visit: Payer: Self-pay

## 2022-07-26 ENCOUNTER — Emergency Department (HOSPITAL_COMMUNITY): Payer: Medicare Other

## 2022-07-26 ENCOUNTER — Emergency Department (HOSPITAL_COMMUNITY)
Admission: EM | Admit: 2022-07-26 | Discharge: 2022-07-26 | Disposition: A | Discharge status: Home / Self Care | Payer: Medicare Other | Attending: Emergency Medicine | Admitting: Emergency Medicine

## 2022-07-26 ENCOUNTER — Encounter (HOSPITAL_COMMUNITY): Payer: Self-pay

## 2022-07-26 DIAGNOSIS — Z7902 Long term (current) use of antithrombotics/antiplatelets: Secondary | ICD-10-CM | POA: Insufficient documentation

## 2022-07-26 DIAGNOSIS — I10 Essential (primary) hypertension: Secondary | ICD-10-CM | POA: Insufficient documentation

## 2022-07-26 DIAGNOSIS — Z7982 Long term (current) use of aspirin: Secondary | ICD-10-CM | POA: Diagnosis not present

## 2022-07-26 DIAGNOSIS — Z7984 Long term (current) use of oral hypoglycemic drugs: Secondary | ICD-10-CM | POA: Diagnosis not present

## 2022-07-26 DIAGNOSIS — R42 Dizziness and giddiness: Secondary | ICD-10-CM | POA: Diagnosis not present

## 2022-07-26 DIAGNOSIS — Z79899 Other long term (current) drug therapy: Secondary | ICD-10-CM | POA: Diagnosis not present

## 2022-07-26 DIAGNOSIS — H532 Diplopia: Secondary | ICD-10-CM | POA: Insufficient documentation

## 2022-07-26 LAB — CBC WITH DIFFERENTIAL/PLATELET
Abs Immature Granulocytes: 0.02 10*3/uL (ref 0.00–0.07)
Basophils Absolute: 0 10*3/uL (ref 0.0–0.1)
Basophils Relative: 1 %
Eosinophils Absolute: 0.1 10*3/uL (ref 0.0–0.5)
Eosinophils Relative: 1 %
HCT: 43.9 % (ref 39.0–52.0)
Hemoglobin: 14.2 g/dL (ref 13.0–17.0)
Immature Granulocytes: 0 %
Lymphocytes Relative: 20 %
Lymphs Abs: 1.5 10*3/uL (ref 0.7–4.0)
MCH: 28.6 pg (ref 26.0–34.0)
MCHC: 32.3 g/dL (ref 30.0–36.0)
MCV: 88.3 fL (ref 80.0–100.0)
Monocytes Absolute: 0.5 10*3/uL (ref 0.1–1.0)
Monocytes Relative: 7 %
Neutro Abs: 5.4 10*3/uL (ref 1.7–7.7)
Neutrophils Relative %: 71 %
Platelets: 164 10*3/uL (ref 150–400)
RBC: 4.97 MIL/uL (ref 4.22–5.81)
RDW: 13.8 % (ref 11.5–15.5)
WBC: 7.5 10*3/uL (ref 4.0–10.5)
nRBC: 0 % (ref 0.0–0.2)

## 2022-07-26 LAB — BASIC METABOLIC PANEL
Anion gap: 9 (ref 5–15)
BUN: 18 mg/dL (ref 8–23)
CO2: 25 mmol/L (ref 22–32)
Calcium: 9.5 mg/dL (ref 8.9–10.3)
Chloride: 107 mmol/L (ref 98–111)
Creatinine, Ser: 1.16 mg/dL (ref 0.61–1.24)
GFR, Estimated: >60 mL/min (ref >60)
Glucose, Bld: 92 mg/dL (ref 70–99)
Potassium: 4.2 mmol/L (ref 3.5–5.1)
Sodium: 141 mmol/L (ref 135–145)

## 2022-07-26 MED ORDER — IOHEXOL 350 MG/ML SOLN
75.0000 mL | Freq: Once | INTRAVENOUS | Status: AC | PRN
  Administered 2022-07-26: 75 mL via INTRAVENOUS

## 2022-07-26 NOTE — ED Provider Triage Note (Signed)
Emergency Medicine Provider Triage Evaluation Note  Dennis Bonfield Sr. , a 67 y.o. male  was evaluated in triage.  Pt complains of 2 days of epistaxis followed by mild increased dizziness.  Reports constant waxing/waning dizziness and double vision at baseline since CVA 1 year ago, currently on chronic anticoagulation and has been taking as directed.  Denies any other new neurodeficits or difficulty walking.  Denies recent URI or head injury within the last 6 weeks.  Chronic intermittent headaches since CVA last year.  Denies fevers, leg weakness, or back pain.  Review of Systems  Positive:  Negative: See above  Physical Exam  BP (!) 174/96   Pulse 85   Temp 98.5 F (36.9 C)   Resp (!) 24   SpO2 99%  Gen:   Awake, no distress   Resp:  Normal effort  MSK:   Moves extremities without difficulty  Other:  Negative BEFAST.  EOMs grossly intact.  PERRLA.  Sitting comfortably  No active hemorrhage from nares.  Maxillary sinus nonTTP.  Does not appear pale or cyanotic.  BLE BUE appear grossly neurovascularly intact and coordinated.  Medical Decision Making  Medically screening exam initiated at 1:21 PM.  Appropriate orders placed.  Dennis Haw Sr. was informed that the remainder of the evaluation will be completed by another provider, this initial triage assessment does not replace that evaluation, and the importance of remaining in the ED until their evaluation is complete.     Prince Rome, PA-C [id number]

## 2022-07-26 NOTE — ED Provider Notes (Signed)
[facility] Provider Note   CSN: [id number] Arrival date & time: 07/26/22  1314     History  Chief Complaint  Patient presents with   Dizziness    Dennis Luellen Sr. is a 67 y.o. male.  With a history of stroke approximately 2 years ago with some residual unsteadiness and left-sided double vision hypertension, BPH, dyslipidemia presents to the ED for evaluation of worsening of his unsteadiness.  He states that this began approximately 3 days ago.  He had 2 episodes of epistaxis approximately 4 days ago and then had another episode of epistaxis 3 days ago on the day of symptom worsening.  These lasted for less than an hour each time and he was able to get them to stop direct pressure.  He states his symptoms have very mildly increased so he called his primary neurologist and was informed to come to the ED for further evaluation.  His neurologist is at [facility] but patient's wife states that they think that [facility] has a better stroke protocol.  he is on aspirin and Plavix for stroke prevention.  He walks with the assistance of a cane at baseline.  He states he feels like "someone put a white blanket on me."  He denies dizziness, lightheadedness, weakness.  Denies systemic symptoms including chest pain, shortness of breath, fevers, chills, cough, nausea, vomiting.  States he did not want to come here but did out of an abundance of caution.   Dizziness      Home Medications Prior to Admission medications   Medication Sig Start Date End Date Taking? Authorizing Provider  acetaminophen (TYLENOL) 325 MG tablet Take 2 tablets (650 mg total) by mouth every 4 (four) hours as needed for mild pain (or temp > 37.5 C (99.5 F)). 10/30/20   Angiulli, Lavon Paganini, PA-C  aspirin 325 MG tablet Take 1 tablet (325 mg total) by mouth daily. 10/21/20   Geradine Girt, DO  Calcium Carb-Cholecalciferol 500-400 MG-UNIT TABS Take 1 tablet by mouth daily.  10/30/20   Angiulli, Lavon Paganini, PA-C  cholecalciferol (VITAMIN D) 25 MCG (1000 UNIT) tablet Take 1 tablet (1,000 Units total) by mouth daily. 10/30/20   Angiulli, Lavon Paganini, PA-C  clopidogrel (PLAVIX) 75 MG tablet Take 1 tablet (75 mg total) by mouth daily. 11/28/20   Raulkar, Clide Deutscher, MD  escitalopram (LEXAPRO) 10 MG tablet Take 10 mg by mouth daily.    [provider]  finasteride (PROSCAR) 5 MG tablet Take 1 tablet (5 mg total) by mouth daily. 10/30/20   Angiulli, Lavon Paganini, PA-C  gabapentin (NEURONTIN) 300 MG capsule Take 1 capsule (300 mg total) by mouth daily. 10/30/20   Angiulli, Lavon Paganini, PA-C  lisinopril (ZESTRIL) 10 MG tablet Take 1 tablet (10 mg total) by mouth daily. 11/28/20   Raulkar, Clide Deutscher, MD  metFORMIN (GLUCOPHAGE) 500 MG tablet Take 1 tablet (500 mg total) by mouth 2 (two) times daily. 10/30/20   Angiulli, Lavon Paganini, PA-C  Multiple Vitamin (MULTIVITAMIN WITH MINERALS) TABS tablet Take 1 tablet by mouth daily. 10/21/20   Geradine Girt, DO  rosuvastatin (CRESTOR) 40 MG tablet TAKE ONE TABLET BY MOUTH AT BEDTIME 12/23/20   Raulkar, Clide Deutscher, MD  tamsulosin (FLOMAX) 0.4 MG CAPS capsule Take 2 capsules (0.8 mg total) by mouth daily. 10/30/20   Angiulli, Lavon Paganini, PA-C  vitamin C (ASCORBIC ACID) 500 MG tablet Take 1 tablet (500 mg total) by mouth daily. 10/30/20  Angiulli, Lavon Paganini, PA-C      Allergies    Patient has no known allergies.    Review of Systems   Review of Systems  Neurological:  Positive for dizziness.  All other systems reviewed and are negative.   Physical Exam Updated Vital Signs BP (!) 174/96   Pulse 85   Temp 98 F (36.7 C) (Oral)   Resp (!) 24   Ht 6' 1"$  (1.854 m)   Wt 113.4 kg   SpO2 99%   BMI 32.98 kg/m  Physical Exam Vitals and nursing note reviewed.  Constitutional:      General: He is not in acute distress.    Appearance: He is well-developed.  HENT:     Head: Normocephalic and atraumatic.     Nose: No congestion or rhinorrhea.      Mouth/Throat:     Mouth: Mucous membranes are moist.     Pharynx: Oropharynx is clear.  Eyes:     Extraocular Movements: Extraocular movements intact.     Conjunctiva/sclera: Conjunctivae normal.     Pupils: Pupils are equal, round, and reactive to light.  Cardiovascular:     Rate and Rhythm: Normal rate and regular rhythm.     Heart sounds: No murmur heard. Pulmonary:     Effort: Pulmonary effort is normal. No respiratory distress.     Breath sounds: Normal breath sounds. No stridor. No wheezing, rhonchi or rales.  Abdominal:     Palpations: Abdomen is soft.     Tenderness: There is no abdominal tenderness. There is no guarding.  Musculoskeletal:        General: No swelling.     Cervical back: Neck supple. No rigidity.  Skin:    General: Skin is warm and dry.     Capillary Refill: Capillary refill takes less than 2 seconds.  Neurological:     General: No focal deficit present.     Mental Status: He is alert and oriented to person, place, and time.     Comments: No facial asymmetry, slurred speech, unilateral or global weakness, pronator drift.  Normal finger-to-nose, heel-to-shin and shoulder shrug.  Sensation intact in all extremities.  Psychiatric:        Mood and Affect: Mood normal.     ED Results / Procedures / Treatments   Labs (all labs ordered are listed, but only abnormal results are displayed) Labs Reviewed  BASIC METABOLIC PANEL  CBC WITH DIFFERENTIAL/PLATELET    EKG EKG Interpretation  Date/Time:  Monday July 26 2022 13:24:23 EST Ventricular Rate:  77 PR Interval:  162 QRS Duration: 98 QT Interval:  366 QTC Calculation: 414 R Axis:   2 Text Interpretation: Normal sinus rhythm Moderate voltage criteria for LVH, may be normal variant ( R in aVL , Cornell product ) Nonspecific ST and T wave abnormality Abnormal ECG When compared with ECG of 17-Oct-2020 03:43, PREVIOUS ECG IS PRESENT Confirmed by Wandra Arthurs [phone]) on 07/26/2022 5:12:48  PM  Radiology CT ANGIO HEAD NECK W WO CM  Result Date: 07/26/2022 CLINICAL DATA:  Follow-up stroke EXAM: CT ANGIOGRAPHY HEAD AND NECK TECHNIQUE: Multidetector CT imaging of the head and neck was performed using the standard protocol during bolus administration of intravenous contrast. Multiplanar CT image reconstructions and MIPs were obtained to evaluate the vascular anatomy. Carotid stenosis measurements (when applicable) are obtained utilizing NASCET criteria, using the distal internal carotid diameter as the denominator. RADIATION DOSE REDUCTION: This exam was performed according to the departmental dose-optimization program which  includes automated exposure control, adjustment of the mA and/or kV according to patient size and/or use of iterative reconstruction technique. CONTRAST:  67m OMNIPAQUE IOHEXOL 350 MG/ML SOLN COMPARISON:  CT and MRI studies earlier today. Older studies including CT angiography 0May 28, 2022FINDINGS: CTA NECK FINDINGS Aortic arch: Aortic atherosclerosis. Branching pattern is normal without origin stenosis. Right carotid system: Common carotid artery widely patent to the bifurcation. Calcified plaque at the carotid bifurcation. Soft and calcified plaque in the ICA bulb. No stenosis. Cervical ICA widely patent beyond that. Left carotid system: Common carotid artery widely patent to the bifurcation. Soft and calcified plaque at the carotid bifurcation but no stenosis. Cervical ICA widely patent beyond that. Vertebral arteries: Both vertebral artery origins are widely patent. The right vertebral artery is dominant. Both vertebral arteries are patent through the cervical region to the foramen magnum. Left vertebral artery occlusion at that point. Skeleton: Ordinary cervical spondylosis. Other neck: No mass or lymphadenopathy. Upper chest: 2 tiny subpleural nodules in the right upper lobe. No follow-up recommended. Review of the MIP images confirms the above findings CTA HEAD FINDINGS  Anterior circulation: Both internal carotid arteries are patent through the skull base and siphon regions. Ordinary siphon atherosclerotic calcification without stenosis greater than 30%. The anterior and middle cerebral vessels are patent. No large vessel occlusion or proximal stenosis. No aneurysm or vascular malformation. There is some distal vessel atherosclerotic narrowing and irregularity. Posterior circulation: Dominant right vertebral artery is patent through the foramen magnum, supplying the basilar artery. Left vertebral artery supplies PICA but is then occluded. No basilar stenosis. Superior cerebellar and posterior cerebral arteries appear patent. There is some distal vessel atherosclerotic narrowing and irregularity. Venous sinuses: Patent and normal Anatomic variants: None significant. Review of the MIP images confirms the above findings IMPRESSION: 1. Aortic atherosclerosis. 2. Atherosclerotic disease at both carotid bifurcations but without stenosis. 3. No intracranial anterior circulation large vessel occlusion or correctable proximal stenosis. Distal vessel atherosclerotic narrowing and irregularity. 4. 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 Dominant right vertebral artery is widely patent. Aortic Atherosclerosis (ICD10-I70.0). Electronically Signed   By: MNelson ChimesM.D.   On: 07/26/2022 19:44   MR BRAIN WO CONTRAST  Result Date: 07/26/2022 CLINICAL DATA:  Neuro deficit, acute, stroke suspected. EXAM: MRI HEAD WITHOUT CONTRAST TECHNIQUE: Multiplanar, multiecho pulse sequences of the brain and surrounding structures were obtained without intravenous contrast. COMPARISON:  Head CT earlier same day.  MRI 10/18/2020. FINDINGS: Brain: Diffusion imaging does not show any acute or subacute infarction. There is an old infarction of the left lateral medulla. No focal cerebellar insult is seen. Chronic dilated perivascular spaces in the mid  brain. Old small vessel infarctions affecting the thalami. Moderate chronic small-vessel ischemic changes throughout the deep and subcortical hemispheric white matter. No large vessel territory stroke. No mass lesion, hemorrhage, hydrocephalus or extra-axial collection. Vascular: Major vessels at the base of the brain show flow. Skull and upper cervical spine: Negative Sinuses/Orbits: Clear/normal Other: None IMPRESSION: No acute finding. Chronic small-vessel ischemic changes throughout the brain as outlined above. Electronically Signed   By: MNelson ChimesM.D.   On: 07/26/2022 19:16   CT Head Wo Contrast  Result Date: 07/26/2022 CLINICAL DATA:  Stroke follow-up. Epistaxis on Thursday Friday. Patient is on blood thinner. EXAM: CT HEAD WITHOUT CONTRAST TECHNIQUE: Contiguous axial images were obtained from the base of the skull through the vertex without intravenous contrast. RADIATION DOSE REDUCTION: This exam was performed  according to the departmental dose-optimization program which includes automated exposure control, adjustment of the mA and/or kV according to patient size and/or use of iterative reconstruction technique. COMPARISON:  MRI 10/18/2020 and CT head 10/17/2020 FINDINGS: Brain: There is periventricular white matter change consistent with small vessel disease. Small remote lacunar infarct within the RIGHT thalamus appears stable. There is no intra or extra-axial fluid collection or mass lesion. The basilar cisterns and ventricles have a normal appearance. There is no CT evidence for acute infarction or hemorrhage. Vascular: There is atherosclerotic calcification of the internal carotid arteries. No hyperdense vessels. Skull: Normal. Negative for fracture or focal lesion. Sinuses/Orbits: No acute finding. Other: None IMPRESSION: 1. No evidence for acute intracranial abnormality. 2. Small vessel disease. 3. Remote lacunar infarct of the RIGHT thalamus. Electronically Signed   By: Nolon Nations M.D.    On: 07/26/2022 16:57    Procedures Procedures    Medications Ordered in ED Medications  iohexol (OMNIPAQUE) 350 MG/ML injection 75 mL (75 mLs Intravenous Contrast Given 07/26/22 1930)    ED Course/ Medical Decision Making/ A&P                             Medical Decision Making Amount and/or Complexity of Data Reviewed Radiology: ordered.  Risk Prescription drug management.  This patient presents to the ED for concern of unbalanced sensation, this involves an extensive number of treatment options, and is a complaint that carries with it a high risk of complications and morbidity.  The differential diagnosis includes vertigo, sequela of previous stroke, new intracranial abnormality  Co morbidities that complicate the patient evaluation  stroke approximately 2 years ago with some residual unsteadiness and left-sided double vision hypertension, BPH, dyslipidemia  My initial workup includes basic labs, CT head without contrast, CTA head and neck, MRI brain  Additional history obtained from: Nursing notes from this visit.  I ordered, reviewed and interpreted labs which include: BMP, CBC.  Labs unremarkable  I ordered imaging studies including CT head, CTA head and neck, MRI brain I independently visualized and interpreted imaging which showed no acute findings I agree with the radiologist interpretation  Afebrile, hemodynamically stable.  67 year old male presents the ED for evaluation of unsteadiness.  He has is at baseline but has gotten slightly worse over the last 3 days.  His neurologic exam is unremarkable and is negative for large vessel occlusion.  His labs are unremarkable.  His imaging revealed no acute abnormalities.  Low suspicion for intracranial abnormalities as cause of his symptoms.  He was encouraged to follow-up with his neurologist next week for further evaluation.  He was given information regarding fall prevention in the home.  He is given return precautions.   Stable at discharge.  At this time there does not appear to be any evidence of an acute emergency medical condition and the patient appears stable for discharge with appropriate outpatient follow up. Diagnosis was discussed with patient who verbalizes understanding of care plan and is agreeable to discharge. I have discussed return precautions with patient and wife who verbalizes understanding. Patient encouraged to follow-up with their PCP within 1 week. All questions answered.  Patient's case discussed with Dr. Darl Householder who agrees with plan to discharge with follow-up.   Note: Portions of this report may have been transcribed using voice recognition software. Every effort was made to ensure accuracy; however, inadvertent computerized transcription errors may still be present.  Final Clinical Impression(s) / ED Diagnoses Final diagnoses:  Lightheadedness    Rx / DC Orders ED Discharge Orders     None         Nehemiah Massed 07/26/22 2012    Drenda Freeze, MD 07/26/22 2240

## 2022-07-26 NOTE — Discharge Instructions (Signed)
You have been seen today for your complaint of unbalanced sensation. Your lab work was reassuring and showed no abnormalities. Your imaging was reassuring and showed no abnormalities. Home care instructions are as follows:  Change positions slowly. Follow up with: Your neurologist.  You should call to schedule an appointment in the next week Please seek immediate medical care if you develop any of the following symptoms: Severe headache, significant vision changes, falls, signs of stroke At this time there does not appear to be the presence of an emergent medical condition, however there is always the potential for conditions to change. Please read and follow the below instructions.  Do not take your medicine if  develop an itchy rash, swelling in your mouth or lips, or difficulty breathing; call 911 and seek immediate emergency medical attention if this occurs.  You may review your lab tests and imaging results in their entirety on your MyChart account.  Please discuss all results of fully with your primary care provider and other specialist at your follow-up visit.  Note: Portions of this text may have been transcribed using voice recognition software. Every effort was made to ensure accuracy; however, inadvertent computerized transcription errors may still be present.

## 2022-07-26 NOTE — ED Notes (Signed)
Patient transported to MRI 

## 2022-07-26 NOTE — ED Triage Notes (Signed)
Pt c/o epistaxis twice last Thurs and Fri. Pt states blood clots would come out of nose. Pt states he is on a blood thinner, but not sure the name of it. Pt c/o more dizziness than usualx2d. Pt states has double vision and unsteady gait at baseline from prior stroke. Pt denies any other sx.
